# Patient Record
Sex: Female | Born: 1960 | Race: White | Hispanic: No | Marital: Married | Smoking: Never smoker
Health system: Southern US, Community
[De-identification: ages and names within clinical notes are randomized; demographics above are authoritative.]

## PROBLEM LIST (undated history)

## (undated) DIAGNOSIS — I729 Aneurysm of unspecified site: Secondary | ICD-10-CM

## (undated) DIAGNOSIS — J189 Pneumonia, unspecified organism: Secondary | ICD-10-CM

## (undated) DIAGNOSIS — E785 Hyperlipidemia, unspecified: Secondary | ICD-10-CM

## (undated) DIAGNOSIS — K8020 Calculus of gallbladder without cholecystitis without obstruction: Secondary | ICD-10-CM

## (undated) DIAGNOSIS — K760 Fatty (change of) liver, not elsewhere classified: Secondary | ICD-10-CM

## (undated) DIAGNOSIS — K219 Gastro-esophageal reflux disease without esophagitis: Secondary | ICD-10-CM

## (undated) DIAGNOSIS — I10 Essential (primary) hypertension: Secondary | ICD-10-CM

## (undated) DIAGNOSIS — C569 Malignant neoplasm of unspecified ovary: Secondary | ICD-10-CM

## (undated) DIAGNOSIS — T7840XA Allergy, unspecified, initial encounter: Secondary | ICD-10-CM

## (undated) DIAGNOSIS — Z8673 Personal history of transient ischemic attack (TIA), and cerebral infarction without residual deficits: Secondary | ICD-10-CM

## (undated) DIAGNOSIS — O1490 Unspecified pre-eclampsia, unspecified trimester: Secondary | ICD-10-CM

## (undated) DIAGNOSIS — Z6835 Body mass index (BMI) 35.0-35.9, adult: Secondary | ICD-10-CM

## (undated) HISTORY — DX: Allergy, unspecified, initial encounter: T78.40XA

## (undated) HISTORY — DX: Aneurysm of unspecified site: I72.9

## (undated) HISTORY — DX: Fatty (change of) liver, not elsewhere classified: K76.0

## (undated) HISTORY — DX: Gastro-esophageal reflux disease without esophagitis: K21.9

## (undated) HISTORY — DX: Hyperlipidemia, unspecified: E78.5

## (undated) HISTORY — DX: Body mass index (BMI) 35.0-35.9, adult: Z68.35

## (undated) HISTORY — DX: Unspecified pre-eclampsia, unspecified trimester: O14.90

## (undated) HISTORY — DX: Malignant neoplasm of unspecified ovary: C56.9

## (undated) HISTORY — PX: HYSTEROTOMY: SHX1776

## (undated) HISTORY — PX: BREAST BIOPSY: SHX20

## (undated) HISTORY — DX: Calculus of gallbladder without cholecystitis without obstruction: K80.20

## (undated) HISTORY — DX: Essential (primary) hypertension: I10

## (undated) HISTORY — DX: Personal history of transient ischemic attack (TIA), and cerebral infarction without residual deficits: Z86.73

---

## 1999-08-09 ENCOUNTER — Other Ambulatory Visit: Admission: RE | Admit: 1999-08-09 | Discharge: 1999-08-09 | Payer: Self-pay | Admitting: *Deleted

## 2001-12-17 ENCOUNTER — Other Ambulatory Visit: Admission: RE | Admit: 2001-12-17 | Discharge: 2001-12-17 | Payer: Self-pay | Admitting: Obstetrics and Gynecology

## 2003-09-16 ENCOUNTER — Encounter: Admission: RE | Admit: 2003-09-16 | Discharge: 2003-09-16 | Payer: Self-pay | Admitting: Obstetrics and Gynecology

## 2003-09-27 ENCOUNTER — Encounter: Admission: RE | Admit: 2003-09-27 | Discharge: 2003-09-27 | Payer: Self-pay | Admitting: Obstetrics and Gynecology

## 2003-10-08 DIAGNOSIS — Z8673 Personal history of transient ischemic attack (TIA), and cerebral infarction without residual deficits: Secondary | ICD-10-CM

## 2003-10-08 HISTORY — DX: Personal history of transient ischemic attack (TIA), and cerebral infarction without residual deficits: Z86.73

## 2003-11-09 ENCOUNTER — Other Ambulatory Visit: Admission: RE | Admit: 2003-11-09 | Discharge: 2003-11-09 | Payer: Self-pay | Admitting: Gynecology

## 2004-02-14 ENCOUNTER — Encounter: Admission: RE | Admit: 2004-02-14 | Discharge: 2004-05-14 | Payer: Self-pay | Admitting: Gynecology

## 2004-05-23 ENCOUNTER — Inpatient Hospital Stay (HOSPITAL_COMMUNITY): Admission: AD | Admit: 2004-05-23 | Discharge: 2004-05-25 | Payer: Self-pay | Admitting: Gynecology

## 2004-05-31 ENCOUNTER — Inpatient Hospital Stay (HOSPITAL_COMMUNITY): Admission: AD | Admit: 2004-05-31 | Discharge: 2004-06-03 | Payer: Self-pay | Admitting: Gynecology

## 2004-06-04 ENCOUNTER — Inpatient Hospital Stay (HOSPITAL_COMMUNITY): Admission: AD | Admit: 2004-06-04 | Discharge: 2004-06-09 | Payer: Self-pay | Admitting: Gynecology

## 2004-06-08 ENCOUNTER — Encounter: Payer: Self-pay | Admitting: Internal Medicine

## 2004-07-04 ENCOUNTER — Other Ambulatory Visit: Admission: RE | Admit: 2004-07-04 | Discharge: 2004-07-04 | Payer: Self-pay | Admitting: Gynecology

## 2004-09-17 ENCOUNTER — Encounter: Admission: RE | Admit: 2004-09-17 | Discharge: 2004-09-17 | Payer: Self-pay | Admitting: Gynecology

## 2005-01-02 ENCOUNTER — Encounter: Admission: RE | Admit: 2005-01-02 | Discharge: 2005-01-02 | Payer: Self-pay | Admitting: Neurology

## 2005-09-25 ENCOUNTER — Encounter: Admission: RE | Admit: 2005-09-25 | Discharge: 2005-09-25 | Payer: Self-pay | Admitting: Gynecology

## 2005-11-27 ENCOUNTER — Ambulatory Visit (HOSPITAL_COMMUNITY): Admission: RE | Admit: 2005-11-27 | Discharge: 2005-11-27 | Payer: Self-pay | Admitting: Family Medicine

## 2005-12-18 ENCOUNTER — Ambulatory Visit (HOSPITAL_COMMUNITY): Admission: RE | Admit: 2005-12-18 | Discharge: 2005-12-18 | Payer: Self-pay | Admitting: Family Medicine

## 2006-09-26 ENCOUNTER — Encounter: Admission: RE | Admit: 2006-09-26 | Discharge: 2006-09-26 | Payer: Self-pay | Admitting: Gynecology

## 2007-09-28 ENCOUNTER — Encounter: Admission: RE | Admit: 2007-09-28 | Discharge: 2007-09-28 | Payer: Self-pay | Admitting: Family Medicine

## 2008-10-18 ENCOUNTER — Encounter: Admission: RE | Admit: 2008-10-18 | Discharge: 2008-10-18 | Payer: Self-pay | Admitting: Family Medicine

## 2008-11-01 ENCOUNTER — Encounter: Payer: Self-pay | Admitting: Pulmonary Disease

## 2008-11-01 ENCOUNTER — Ambulatory Visit (HOSPITAL_COMMUNITY): Admission: RE | Admit: 2008-11-01 | Discharge: 2008-11-01 | Payer: Self-pay | Admitting: Family Medicine

## 2009-02-07 ENCOUNTER — Ambulatory Visit: Payer: Self-pay | Admitting: Pulmonary Disease

## 2009-02-07 DIAGNOSIS — R05 Cough: Secondary | ICD-10-CM

## 2009-02-07 DIAGNOSIS — R059 Cough, unspecified: Secondary | ICD-10-CM | POA: Insufficient documentation

## 2009-05-02 ENCOUNTER — Ambulatory Visit: Payer: Self-pay | Admitting: Gynecology

## 2009-05-02 ENCOUNTER — Encounter: Payer: Self-pay | Admitting: Gynecology

## 2009-05-02 ENCOUNTER — Other Ambulatory Visit: Admission: RE | Admit: 2009-05-02 | Discharge: 2009-05-02 | Payer: Self-pay | Admitting: Gynecology

## 2009-10-07 HISTORY — PX: ABDOMINAL HYSTERECTOMY: SHX81

## 2009-10-25 ENCOUNTER — Encounter: Admission: RE | Admit: 2009-10-25 | Discharge: 2009-10-25 | Payer: Self-pay | Admitting: Gynecology

## 2009-10-27 ENCOUNTER — Encounter: Admission: RE | Admit: 2009-10-27 | Discharge: 2009-10-27 | Payer: Self-pay | Admitting: Gynecology

## 2010-03-14 ENCOUNTER — Ambulatory Visit: Payer: Self-pay | Admitting: Gynecology

## 2010-03-22 ENCOUNTER — Encounter: Admission: RE | Admit: 2010-03-22 | Discharge: 2010-03-22 | Payer: Self-pay | Admitting: Gynecology

## 2010-05-03 ENCOUNTER — Other Ambulatory Visit: Admission: RE | Admit: 2010-05-03 | Discharge: 2010-05-03 | Payer: Self-pay | Admitting: Gynecology

## 2010-05-03 ENCOUNTER — Ambulatory Visit: Payer: Self-pay | Admitting: Gynecology

## 2010-05-29 ENCOUNTER — Ambulatory Visit: Payer: Self-pay | Admitting: Gynecology

## 2010-06-13 ENCOUNTER — Ambulatory Visit: Payer: Self-pay | Admitting: Gynecology

## 2010-06-19 ENCOUNTER — Ambulatory Visit: Payer: Self-pay | Admitting: Gynecology

## 2010-07-02 ENCOUNTER — Ambulatory Visit: Payer: Self-pay | Admitting: Gynecology

## 2010-07-06 ENCOUNTER — Ambulatory Visit: Payer: Self-pay | Admitting: Gynecology

## 2010-07-18 ENCOUNTER — Ambulatory Visit (HOSPITAL_COMMUNITY): Admission: RE | Admit: 2010-07-18 | Discharge: 2010-07-19 | Payer: Self-pay | Admitting: Gynecology

## 2010-07-18 ENCOUNTER — Encounter: Payer: Self-pay | Admitting: Gynecology

## 2010-07-18 ENCOUNTER — Ambulatory Visit: Payer: Self-pay | Admitting: Gynecology

## 2010-07-18 HISTORY — PX: LAPAROSCOPIC TOTAL HYSTERECTOMY: SUR800

## 2010-08-02 ENCOUNTER — Ambulatory Visit: Payer: Self-pay | Admitting: Gynecology

## 2010-08-27 ENCOUNTER — Ambulatory Visit: Payer: Self-pay | Admitting: Gynecology

## 2010-10-28 ENCOUNTER — Encounter: Payer: Self-pay | Admitting: Gynecology

## 2010-10-30 ENCOUNTER — Encounter
Admission: RE | Admit: 2010-10-30 | Discharge: 2010-10-30 | Payer: Self-pay | Source: Home / Self Care | Attending: Gynecology | Admitting: Gynecology

## 2010-12-19 LAB — CBC
HCT: 28.7 % — ABNORMAL LOW (ref 36.0–46.0)
HCT: 38 % (ref 36.0–46.0)
Hemoglobin: 12.7 g/dL (ref 12.0–15.0)
MCH: 29 pg (ref 26.0–34.0)
MCH: 29.2 pg (ref 26.0–34.0)
MCHC: 33.4 g/dL (ref 30.0–36.0)
MCV: 86.2 fL (ref 78.0–100.0)
MCV: 87.4 fL (ref 78.0–100.0)
Platelets: 326 K/uL (ref 150–400)
RBC: 3.33 MIL/uL — ABNORMAL LOW (ref 3.87–5.11)
RBC: 4.35 MIL/uL (ref 3.87–5.11)
RDW: 13.8 % (ref 11.5–15.5)
WBC: 5.9 K/uL (ref 4.0–10.5)
WBC: 8.3 10*3/uL (ref 4.0–10.5)

## 2010-12-19 LAB — URINALYSIS, ROUTINE W REFLEX MICROSCOPIC
Bilirubin Urine: NEGATIVE
Ketones, ur: NEGATIVE mg/dL
Nitrite: NEGATIVE
Specific Gravity, Urine: <1.005 — ABNORMAL LOW (ref 1.005–1.030)
Urobilinogen, UA: 0.2 mg/dL (ref 0.0–1.0)

## 2010-12-19 LAB — SURGICAL PCR SCREEN
MRSA, PCR: NEGATIVE
Staphylococcus aureus: NEGATIVE

## 2010-12-19 LAB — ABO/RH: ABO/RH(D): O POS

## 2010-12-19 LAB — HCG, SERUM, QUALITATIVE: Preg, Serum: NEGATIVE

## 2011-01-30 ENCOUNTER — Ambulatory Visit (HOSPITAL_COMMUNITY): Admission: RE | Admit: 2011-01-30 | Payer: Self-pay | Source: Ambulatory Visit | Admitting: Interventional Radiology

## 2011-02-22 NOTE — Discharge Summary (Signed)
NAMEJENISSA, TYRELL                         ACCOUNT NO.:  [id number]   MEDICAL RECORD NO.:  [id number]                   PATIENT TYPE:  INP   LOCATION:                                       FACILITY:  [facility]   PHYSICIAN:  Rene Paci, M.D. [facility]          DATE OF BIRTH:  [birth year]   DATE OF ADMISSION:  06/04/2004  DATE OF DISCHARGE:  06/09/2004                                 DISCHARGE SUMMARY   DISCHARGE DIAGNOSES:  1.  Headache.  2.  Eclampsia.  3.  Hypertension.  4.  Left posterior parietal subarachnoid hemorrhage.  5.  Hypokalemia.   BRIEF ADMISSION HISTORY:  Ms. Veno is a 50 year old white female who  delivered a baby on May 18, 2004.  At that time she was normotensive.  She was readmitted on June 01, 2004 complaining of headache and found to  have elevated blood pressures.  She was also noted to have elevated LFTs.  She was treated with magnesium sulfate in the AICU.  She was subsequently  discharged home from the hospital August 28 with a blood pressure of 150/85.  She contacted Dr. Lily Peer' office again on June 04, 2004 again with  worse headache.  In the office she was noted to have a blood pressure of  190/96.  Patient was readmitted to [facility] for further evaluation  and treatment.  While there the patient was initiated on labetalol with  improved blood pressure control.  CT of the head was obtained and revealed a  left parietal occipital subarachnoid hemorrhage.  Patient was transferred to  [facility] to the service of [facility] for further  treatment.   PAST MEDICAL HISTORY:  1.  G2, P2 status post vaginal delivery May 18, 2004.  2.  Gestational diabetes.   HOSPITAL COURSE:  #1 - NEUROLOGIC:  The patient presented with evidence of a  left posterior parietal subarachnoid hemorrhage.  She was seen in  consultation by neurosurgery; however, there was no indication for surgical  intervention.  She was also seen in  consultation by neurology.  Dr. Pearlean Brownie  recommended an MRI/MRA brain.  This was performed on June 07, 2004 and  confirmed a small left posterior parietal subarachnoid hemorrhage.  MRA of  the brain showed no aneurysm or vasculitis changes.  MRV revealed no  thrombosis.  Dr. Pearlean Brownie did discuss MRI/MRA with Dr. Corliss Skains.  They decided  to proceed with a cerebral angiogram to rule out possible underlying AVM.  Dr. Pearlean Brownie also recommended checking a sedimentation rate and ANA panel.  Cerebral angiogram is planned for this afternoon and if this is negative we  anticipate her discharge June 09, 2004.  Currently, our working  diagnosis is that she had a left posterior parietal subarachnoid hemorrhage  in the setting of eclampsia with a possible small capillary leak.   #2 - HYPERTENSION:  Patient's blood pressures are currently controlled with  systolic  blood pressures of 130 and currently she is on labetalol, HCTZ, and  Norvasc.  The patient will need close outpatient follow-up with her primary  care physician as well as her OB/GYN.   #3 - HYPOKALEMIA:  Patient developed some mild hypokalemia on HCTZ and we  will additionally send her home on potassium.   DISCHARGE LABORATORIES:  BMET was normal.  Coags were normal.  Urinalysis  was negative.  ANA, rheumatoid factor are both pending.  Sedimentation rate  is pending.   DISCHARGE MEDICATIONS:  1.  Labetalol 200 mg b.i.d.  2.  HCTZ 25 mg daily.  3.  Norvasc 2.5 mg daily.  4.  Vicodin 5/500 one tablet q.6h. p.r.n.  5.  Potassium chloride 10 mEq daily as long as she is taking HCTZ.   FOLLOWUP:  Follow up with Dr. Gerda Diss Tuesday, September 6 for a blood  pressure check and Dr. Lily Peer as instructed.      Cornell Barman, P.A. [facility]                  Rene Paci, M.D. [facility]    LC/MEDQ  D:  06/08/2004  T:  06/09/2004  Job:  [id number]   cc:   Gaetano Hawthorne. Lily Peer, M.D.  [address]  [place]  [place] [address]  Fax:  [phone]   Pramod P. Pearlean Brownie, MD  Fax: [phone]   Hewitt Shorts, M.D.  [address]  [place]  [place] [address]  Fax: [phone]   Scott A. Gerda Diss, M.D.  [address], [address]  [place]  [place] [address]  Fax: [phone]

## 2011-02-22 NOTE — Consult Note (Signed)
[name], [name]                         ACCOUNT NO.:  [id number]   MEDICAL RECORD NO.:  [id number]                   PATIENT TYPE:  INP   LOCATION:  3110                                 FACILITY:  [facility]   PHYSICIAN:  Pramod P. Pearlean Brownie, MD                 DATE OF BIRTH:  November 24, 1960   DATE OF CONSULTATION:  DATE OF DISCHARGE:                                   CONSULTATION   REFERRING PHYSICIAN:  Dr. Shirlean Kelly   REASON FOR REFERRAL:  Headache.   HISTORY OF PRESENT ILLNESS:  Ms. Cannella is a pleasant 50 year old lady who  recently delivered healthy baby girl two weeks ago.  Ten days ago she  started developing intermittent headaches.  She describes as being present  on the vertex, moderate to severe intensity, throbbing in nature, not  accompanied by nausea, vomiting, photo or phonophobia.  She was taking over-  the-counter analgesics which were effective of only temporary relief.  She  was seen to have elevated blood pressure and was treated for delayed  eclampsia.  However, yesterday she complained of severe headache and had a  CT scan of the head which showed a small subarachnoid hemorrhage in the left  parietal perifalcine region prompting neurosurgical consult from Dr.  Newell Coral who recommended neurological consult as there was low likelihood of  this being AV malformation or aneurysm or bleed.  Patient has no prior  history of migraine headache, seizures, or any significant neurological  problems.   PAST MEDICAL HISTORY:  Benign.   PAST SURGICAL HISTORY:  Wisdom tooth removal three years ago.   SOCIAL HISTORY:  She works in an office.  She has two young daughters 2  years and 24 weeks old.  She does not smoke or drink.  She lives in  [place] with her husband.   REVIEW OF SYSTEMS:  Not significant for recent fever, loss of weight, cough,  chest pain, diarrhea.  Significant for headache and recent delivery.   PHYSICAL EXAMINATION:  GENERAL:  Pleasant young lady  not in distress.  VITAL SIGNS:  Afebrile.  Pulse rate 70 per minute, regular, respiratory rate  16 per minute, distal pulses well felt, blood pressure at present 136/90.  HEENT:  Nontraumatic.  ENT examination unremarkable.  NECK:  Supple without bruit.  CARDIAC:  No murmur or gallop.  LUNGS:  Clear to auscultation.  NEUROLOGIC:  Patient is awake, alert, oriented x3 with normal speech and  language function.  There is no aphasia, dyspraxia, or dysarthria.  Pupils  equally reactive to light and accommodation.  Visual acuity and fields are  adequate.  Face is symmetric.  Palate elevates normally.  Tongue is midline.  Motor system examination reveals symmetric strength, tone, reflexes,  coordination, sensation.  Plantars are both downgoing.   DATA REVIEWED:  Noncontrast CAT scan of head done yesterday shows a tiny  area of subarachnoid hemorrhage noted in the  left high parietal perifalcine  region.  There is no parenchymal hemorrhage, brain edema, mass effect, or  hydrocephalus.   IMPRESSION:  A 50 year old lady with delayed postpartum eclampsia with a  headache likely secondary to hypertensive emergency with small amount of  capillary leak.  I doubt this is subarachnoid hemorrhage from aneurysm or AV  malformation.  Venous sinus thrombosis would also be less likely.   PLAN:  I would recommend aggressive blood pressure control with oral and  parenteral antihypertensives to keep blood pressure normal.  The headache is  already improved with blood pressure control and she may continue to use  symptomatic Tylenol if needed.  Recommend MRI scan of the brain with and  without contrast as well as MRA of the brain and MRV of the brain.  I do not  believe she needs diagnostic catheter angiogram at the present time.  She  may be followed by me electively as an outpatient for further neurovascular  work-up if necessary.  Thank you for the referral.                                                Pramod P. Pearlean Brownie, MD    PPS/MEDQ  D:  06/07/2004  T:  06/08/2004  Job:  [id number]

## 2011-02-22 NOTE — H&P (Signed)
NAMEJERRILYN, [name]                         ACCOUNT NO.:  [id number]   MEDICAL RECORD NO.:  [id number]                   PATIENT TYPE:  INP   LOCATION:  9169                                 FACILITY:  [facility]   PHYSICIAN:  Juan H. Lily Peer, M.D.             DATE OF BIRTH:  1961/04/04   DATE OF ADMISSION:  05/23/2004  DATE OF DISCHARGE:                                HISTORY & PHYSICAL   CHIEF COMPLAINT:  Contractions.   The patient is a 50 year old gravida 2 para 1 with a due date of May 21, 2004.  The patient is currently 40 and two-sevenths weeks gestation.  Presented to the office today complaining of contractions.  The patient with  known history of gestational diabetes, diet controlled.  Had been placed on  the monitor for a nonstress test which was reassuring.  She was found to be  contracting every 4-6 minutes apart.  Her cervix was found to be 3 cm  dilated, 80% effaced, -3 station, and intact.  An ultrasound had been done  for estimated fetal weight and AFI.  The ultrasound demonstrated the fetus  to be in the vertex presentation.  Fetal heart rate 162 beats per minute.  Grade 2 anterior fundal placenta.  Estimated fetal weight of 3930 g (8  pounds 10 ounces).  The AFI was 18.1 cm which is in the 80th percentile for  40-and-a-half weeks gestation.  The patient will be sent to [facility]  for anticipation of vaginal delivery.  The patient's prenatal course,  despite the gestational diabetes, she had been treated for urinary tract  infection and she had declined prenatal testing to include genetic  amniocentesis, maternal serum alpha-fetoprotein, and cystic fibrosis  screening.   REVIEW OF SYSTEMS:  See [place] form.   PAST MEDICAL HISTORY:  Spontaneous vaginal delivery in 1996 without any  complications.  She had a wisdom tooth removed approximately 3 years ago.  Gestational diabetes this pregnancy.   PHYSICAL EXAMINATION:  VITAL SIGNS:  The patient's blood  pressure is 110/64.  Urine was negative for protein and glucose.  Weight 184 pounds.  HEENT:  Unremarkable.  NECK:  Supple, trachea midline.  No carotid bruits, no thyromegaly.  LUNGS:  Clear to auscultation without rhonchi or wheezes.  HEART:  Regular rate and rhythm, no murmurs or gallops.  BREAST:  Exam not done.  ABDOMEN:  Gravid uterus, vertex presentation by Thayer Ohm maneuver confirmed  by ultrasound.  Fundal height 38-39 cm.  PELVIC:  Cervix 3 cm dilated, 80% effaced, -3 station, intact membranes.  EXTREMITIES:  DTRs 1+, negative for clonus, trace edema.   PRENATAL LABORATORY DATA:  The patient is O positive blood type, negative  antibody screen.  VDRL was nonreactive.  Rubella immune.  Hepatitis B  surface antigen and HIV were negative.  Pap smear was normal.  Declined  genetic amniocentesis.  Declined maternal serum alpha-fetoprotein.  Declined  cystic  fibrosis screening.  Abnormal diabetes screen.  GBS culture was  negative.   ASSESSMENT:  A 50 year old gravida 2 para 1 at 18 and two-sevenths weeks  gestation in early labor and uncomfortable with advanced cervical  dilatation.  Will admit to [facility] to start on Pitocin augmentation  with rupture of membranes, with anticipation of a vaginal delivery.  Her GBS  culture was negative.  The patient requesting epidural for pain relief as  well.  The patient is a gestational diabetic, diet controlled.   PLAN:  Anticipate vaginal delivery.                                               Juan H. Lily Peer, M.D.    JHF/MEDQ  D:  05/23/2004  T:  05/23/2004  Job:  [id number]

## 2011-02-22 NOTE — H&P (Signed)
[name], [name]                         ACCOUNT NO.:  [id number]   MEDICAL RECORD NO.:  [id number]                   PATIENT TYPE:  INP   LOCATION:  9374                                 [facility]ACILITY[facility]   PHYSICIAN:  Juan H. Lily Peer, M.D.             DATE OF BIRTH:  [birth year]   DATE OF ADMISSION:  06/04/2004  DATE OF DISCHARGE:                                HISTORY & PHYSICAL   CHIEF COMPLAINT:  Severe headaches.   HISTORY:  The patient is a 50 year old, gravida 2, para 2 who had an  uncomplicated normal spontaneous vaginal delivery on August 12 and was  discharged home on August 19, routine two day postpartum visit.  Her  hemoglobin and hematocrit were 11.6, 33.3 and platelet count of 206,000 and  she was normotensive. She was readmitted on August 26 complaining of severe  headaches and was found to elevated blood pressures and was found to have  elevated liver function tests with an SGOT of 108 and an LDH as high as  207,000. She was kept on magnesium sulfate in the AICU and had been given  Demerol and Phenergan for headaches and subsequently she was discharged home  from the hospital on August 28 with blood pressures in the 150/85, 142/57,  131/71 and she was asymptomatic and was sent home with Tylox p.r.n. pain.  She contacted the office this afternoon complaining of headaches worse last  night and this morning.  When she presented to the office at [facility], her blood pressure was found to be 190/96.  The patient  complained of a headache on a 1-10 scale of 9/10, no right upper quadrant  pain, no visual disturbances, no nausea or vomiting. The patient denies any  history of migraine headaches and review of her prenatal record indicated  she had been normotensive throughout her pregnancy she just had gestational  diabetes. She does have advanced maternal age whereby she is currently 50  years of age.  She was brought over to the Adult Intensive Care Unit and  her  blood pressure systolics were running in the 180's and diastolic in the low  90's and she was given 20 mg of labetalol IV push and then had a second dose  repeated approximately 20 minutes later and was started on magnesium sulfate  4 g bolus followed by 2 g __________ to help with her headaches.  She was  given Demerol 25 mg with 12.5 of Phenergan and an additional 40 mg of  labetalol 20 minutes later was administered at approximately 1830 hours to  bring her blood pressure under control.  Her last blood pressure reading was  157/78 and her O2 saturations were 92% on room air so she was going to be  given nasal cannula with 3 liters per minute. We will hold off on the  labetalol drip at this point and it looks like she may be responding  now  with the above mentioned regimen and will continue to monitor closely and  keep her on the labetalol starting with 100 mg p.o. q. 12 hours starting at  0630 hours tomorrow morning with repeat of her PIH labs.  After a Foley  catheter was placed, she put out approximately 300 mL within an hour but  prior to the Foley catheter being inserted she had also put out another 300  so her total is 600 mL in an approximately two hour interval. The patient  did not have an edema, deep tendon reflexes were 0-1 and no clonus. Review  of her record also indicated when she was released from the intensive care  unit this past weekend, her weight was 172 pounds, current weight is 164.5  pounds.  The patient's headache resolved with the above regimen.   PAST MEDICAL HISTORY:  Patient with two normal spontaneous vaginal  deliveries, gestational diabetes last pregnancy. Oral surgery several years  ago for wisdom tooth otherwise the patient has been healthy until previous  admission with postpartum preeclampsia.   PHYSICAL EXAMINATION:  VITAL SIGNS:  Blood pressure readings as described  above.  HEENT:  Unremarkable. The was no papilledema, no retinal exudate.   LUNGS:  Clear to auscultation, no rhonchi or wheezes.  HEART:  Regular rate and rhythm. No murmurs or gallops.  BREASTS:  Not done.  ABDOMEN:  Soft, nontender without rebound or guarding.  PELVIC:  Not done.  EXTREMITIES:  Trace edema, DTR 0 to 1+, negative clonus.   ADMISSION LABS:  The patient's white blood count 11.6, hemoglobin 13.9,  hematocrit 41.2, platelet count 251,000, uric acid at 4.9.  LDH was elevated  at 302 unit/liter and her complete metabolic panel all parameters were  normal except the SGPT was slightly elevated at 67.   ASSESSMENT:  A 50 year old, gravida 2, para 2 readmitted to [facility]  with headaches and elevated blood pressure with clinical evidence of  persistent preeclampsia possibly underlying chronic hypertension. The  patient required labetalol 20 mg IV push x2 and a third dose at 40 mg was  administered to get her blood pressure under control. She started a  magnesium sulfate 4 g bolus followed by 2 g/hour and received Demerol 25 mg  with 12.5 Phenergan for headaches. The patient currently sleeping and blood  pressure 157/78 and rates her headache on a 1-10 scale of 2/10 where on  admission it was 9/10.  She is voiding well, will hold off any diuretic at  the present time. Will hold off on the labetalol drip. If she continues to  be normotensive with the above regimen will proceed then with keeping her on  oral labetalol 100 mg q. 12h.  Will repeat the PIH panel in the morning.  Will start also nasal O2 at 3 liters per minute due to the fact that her O2  saturations were down to 92%.   PLAN:  As per assessment above.                                               Juan H. Lily Peer, M.D.    JHF/MEDQ  D:  06/04/2004  T:  06/04/2004  Job:  [id number]

## 2011-02-22 NOTE — Consult Note (Signed)
[name], POINT                         ACCOUNT NO.:  [id number]   MEDICAL RECORD NO.:  [id number]                   PATIENT TYPE:  INP   LOCATION:  3110                                 FACILITY:  [facility]   PHYSICIAN:  Wanda Plump, MD [facility]                 DATE OF BIRTH:  1961/06/11   DATE OF CONSULTATION:  06/06/2004  DATE OF DISCHARGE:                                   CONSULTATION   REASON FOR CONSULTATION:  [name] is a 50 year old white female, patient  of Dr. Reynaldo Minium who is status post normal vaginal delivery on May 18, 2004.  The postpartum was complicated by hypertension, headache and  increased LFTs.  She was admitted on June 01, 2004 due to these problems  and discharged home two days later when she was asymptomatic with a well-  controlled blood pressure.  The next day she complained of severe headache  and that prompted reevaluation by Dr. Lily Peer.  At this time she was found  to have very high blood pressure at the level of 190/96.  She was admitted  to the intensive care unit at [facility].  At that time she was  started on labetalol as well as magnesium sulfate.  The patient responded  well to the treatment.  However, due to the persistent headaches, a CT scan  of the head was done.  It did show a small subarachnoid hemorrhage and this  prompted this consultation and the transfer to [facility].  At the  time of exam, the patient reports she is feeling better.  The headache which  was intense and global before is now mild and mostly located at the nuchal  area.  In additional to this consultation, neurosurgery has also been asked  to evaluate the patient.   PAST MEDICAL HISTORY:  1.  The patient is G2, P2, status post last vaginal delivery May 18, 2004.  This pregnancy was complicated with gestational diabetes.  2.  Other than that she denies any history of hypertension, diabetes, heart      disease, or headaches in the  past.   FAMILY HISTORY:  Mother is 79 years old.  She was diagnosed with  hypertension when she was around 50 years old.  She recently had a stroke.  Father is doing well.  He has history of prostate cancer.  She believes  siblings are doing well.  To the patient's knowledge they do not have any  diabetes or hypertension.  She admits that her mother's side of the family  have some history of coronary artery disease.   SOCIAL HISTORY:  Does not smoke and drinks very seldom.   REVIEW OF SYSTEMS:  She denies any fever, chills, nausea, or vomiting.  No  chest pain, shortness of breath.  After her pregnancy, she had bilateral  ankle edema but that  is largely resolved.  She still has some vaginal  bleeding from the recent delivery.  Again she denies any previous headaches  and no history of tremors or anxiety.   MEDICATIONS:  Prior to this admission, she was only taking prenatal vitamins  and some pain medication for headache.  She specifically denies the use of  any over-the-counter or __________  medication.   ALLERGIES:  No known drug allergies.   PHYSICAL EXAMINATION:  GENERAL:  By the time of my examination, the patient  is alert, oriented and in no apparent distress.  She is coherent and  cooperative.  VITAL SIGNS:  Blood pressure at [facility] was found to be 172/74  with a heart rate of 62, O2 saturation 96%.  LUNGS:  Clear to auscultation bilaterally.  CARDIOVASCULAR:  Regular rate and rhythm without murmur.  ABDOMEN:  Not distended.  Soft.  Good bowel sounds.  No bruits or masses.  EXTREMITIES:  No edema.  NEUROLOGIC:  Motor and face are symmetric.  External ocular movements are  intact.  Pupils are equal and reactive to light.  I perform bedside  funduscopy which was very limited but I did not see any __________  or  hemorrhage.  Her speech is clear.   LABORATORY AND X-RAY DATA:  Uric acid is 5.3, LDH 232, magnesium 2.6, sodium  140, potassium 3.5, BUN 8,  creatinine 0.8, blood sugar 94.  Liver function  tests were negative except for a protein of 5.4.  Hemoglobin is 10.2,  unstable.  Platelets have been stable at around 256 and white count is 8.8.  EKG shows sinus bradycardia and upon admission, urinalysis was negative.  CT  scan of the head shows again very small subarachnoid hemorrhage.   ASSESSMENT/PLAN:  1.  The patient has been transferred to my service with hypertensive      emergency.  At this point the blood pressure is under better control      with labetalol and hydrochlorothiazide.  The goal blood pressure at this      time per neurosurgery is a systolic of around 160.  I am planning to add      a low dose of Norvasc and use hydralazine p.r.n. if needed.  We also      must rule out secondary causes of hypertension.  Until then, we will go      ahead and check ultrasound, TSH, catecholamines.  We will also need to      rule out all etiologies for the hemorrhage such as vasculitis.  Will get      a sed rate and ANA.  2.  Subarachnoid hemorrhage.  She is being evaluated by neurosurgery.  We      will consult the stroke team in the morning.  Neurosurgery is planning      to redo a CT scan in one or two days and they are considering an      arteriogram of the brain.                                               Wanda Plump, MD [facility]    JEP/MEDQ  D:  06/06/2004  T:  06/07/2004  Job:  [id number]

## 2011-02-22 NOTE — Discharge Summary (Signed)
NAMEANINA, SCHNAKE                         ACCOUNT NO.:  [id number]   MEDICAL RECORD NO.:  [id number]                   PATIENT TYPE:  INP   LOCATION:  9317                                 FACILITY:  [facility]   PHYSICIAN:  Timothy P. Fontaine, M.D.           DATE OF BIRTH:  [birth year]   DATE OF ADMISSION:  05/31/2004  DATE OF DISCHARGE:  06/03/2004                                 DISCHARGE SUMMARY   DISCHARGE DIAGNOSES:  Postpartum preeclampsia.   HISTORY OF PRESENT ILLNESS:  [age]year-old status post vaginal delivery  May 23, 2004, who presents complaining of headache.  Her initial  evaluation showed elevated blood pressures in the 169/86, 171/88, 151/88  range.  Initial laboratory evaluation showed elevated SGOT, SGPT at 67 and  148.  Her platelet count was normal.  Her uric acid was 6.5.   HOSPITAL COURSE:  She was admitted and begun on magnesium sulfate and over  the ensuing two days improved to the 140-150/70-80 range.  Her headaches  resolved without signs and symptoms of preeclampsia, and her repeat blood  studies showed improvement with her SGOT of 32, SGPT of 94, and a mildly  elevated LDH of 270.  Her uric acid was 5.3.  The patient was discharged  home noting clear lungs, normal cardiac evaluation without peripheral  swelling with symptoms reviewed to call ASAP with any signs or symptoms of  preeclampsia which were reviewed with her and her husband prior to  discharge.  She was given a prescription for Tylox #20, for pain and she was  to return to her postpartum appointment which had already been scheduled.                                               Timothy P. Audie Box, M.D.    TPF/MEDQ  D:  06/18/2004  T:  06/18/2004  Job:  [id number]

## 2011-02-22 NOTE — Consult Note (Signed)
[name], [name]                         ACCOUNT NO.:  [id number]   MEDICAL RECORD NO.:  [id number]                   PATIENT TYPE:  INP   LOCATION:  3110                                 FACILITY:  [facility]   PHYSICIAN:  Hewitt Shorts, M.D.            DATE OF BIRTH:  02/06/1961   DATE OF CONSULTATION:  06/06/2004  DATE OF DISCHARGE:                                   CONSULTATION   HISTORY OF PRESENT ILLNESS:  The patient is a 50 year old white female who  is 14 days postpartum following an uneventful vaginal delivery of her second  child.  She is gravida 2, para 2.  Her pregnancy itself is nearly  unremarkable.  She noted that she had some hypoglycemia that she treated by  diet modification.  She notes that prior to pregnancy, she was otherwise  healthy.  The patient was discharged following her delivery two days after  delivery but five days afterwards, she developed significant headache.  She  recontacted her OB and was subsequently evaluated later that week in the  office and found to have significant elevation of her blood pressure.  She  was admitted to the [facility] and treated for postpartum pre-  eclampsia.  She was found to have mildly elevated liver function tests.  She  was treated with a number of different medications and after being  stabilized over several days, was discharged three days ago.  However, less  than 12 hours later, she once again developed significant headache, this  time associated with nausea and vomiting.  She returned to the [facility] later that  day, again was found to be hypertensive, and was admitted at that time (two  days ago).  The patient is once again treated for hypertension and  postpartum pre-eclampsia but because of continued complaints of headache,  although it had improved, CT of the brain without contrast was obtained this  evening.  She was found to have minimal left parietal medial sulcus  subarachnoid hemorrhage, the actual  volume of blood was quite minimal, there  was no associated mass affect, and the [facility] hospitalist, Dr. Willow Ora,  was contacted and he accepted the patient in transfer to the hospitalist  service.  He was transferred to the neurosurgical intensive care unit at  [facility].  Dr. Lily Peer consulted me, as well, and asked that I  see the patient once she arrived at [facility].   At this time, the patient does complain of some mild left medial parietal  occipital headache.  She denies any nausea, vomiting, diplopia, blurred  vision, weakness, seizures or other neurologic difficulties.   PAST MEDICAL HISTORY:  She has no history of hypertension, myocardial  infarction, cancer, previous stroke, diabetes (although she did have some  hyperglycemia during her pregnancy) peptic ulcer disease, lung disease.  No  previous surgeries.  She denies allergies to medications and she takes no  medications on a regular basis.   FAMILY HISTORY:  Her parents are in their 20s and both have hypertension and  heart disease.   SOCIAL HISTORY:  The patient does secretarial work, she is married, this is  her second child, her other child is 7 1/2 years old.  She does not smoke.  She drinks alcoholic beverages on a social basis.   REVIEW OF SYMPTOMS:  Notable for as described in the history of present  illness and past medical history, but is, otherwise, unremarkable.   PHYSICAL EXAMINATION:  GENERAL:  The patient is a well developed, well nourished white female in no  acute distress.  VITAL SIGNS:  Temperature 99.2, pulse 62, blood pressure 170/70, respiratory  rate 14.  NEUROLOGICAL EXAMINATION:  Mental status of the patient is she is awake,  alert, she is fully oriented, her speech is fluent, she has good  comprehension, she follows commands briskly.  Cranial nerves show pupils  equal, round, reactive to light and approximately 3 mm bilaterally,  extraocular movements intact, facial  sensation is symmetrical, facial  movement is symmetrical, hearing present bilaterally, palatal movement is  symmetrical, shoulder shrug is symmetrical, tongue in the midline.  Motor  examination shows 5/5 strength in the upper and lower extremities.  She has  no drift to the upper extremities.  Sensory examination is intact sensation  to pin prick.  Reflexes are symmetrical bilaterally.  Her neck is supple.  Gait and stance were not tested.   DIAGNOSTICS:  CT scan of the brain from this evening at [facility] was  reviewed.  The study shows no evidence of subarachnoid hemorrhage in the  basilar cisterns or Sylvian fissures.  There is a minimal amount of blood in  the medial sulci along the left parietal occipital region.  There is no mass  affect.  The appearance is most suggestive of a subarachnoid hemorrhage  associated with hypertensive crisis rather than from aneurysm or vascular  malformation.   IMPRESSION:  Postpartum hypertensive crisis associated with headache.  CT  this evening shows minimal sulcus subarachnoid hemorrhage most consistent  with hypertensive crisis, not consistent with aneurysmal subarachnoid  hemorrhage, unlikely to be due to vascular malformation subarachnoid  hemorrhage.  The patient is neurologically intact but continues to be  hypertensive.   RECOMMENDATIONS:  The case has been discussed with Dr. Lily Peer and also  Dr. Drue Novel.  I think it is essential for the blood pressure to be controlled  ideally to a systolic less than 160 mmHg and eventually progressing to an  oral anti-hypertensive regimen that can be continued at home.  Neurological  checks have been ordered and should be continued.  Follow up CT scan of the  brain without contrast should be obtained on the morning of September 2 or  sooner if she has any neurologic deterioration.  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  I will continue from a neurosurgical perspective but tend to doubt that  neurosurgical intervention will be necessary.  I have had an opportunity to  discuss the case thoroughly with Dr. Lily Peer and Dr. Drue Novel but also with the  patient and her husband  and I have had an opportunity to review the CT scan  with the patient's husband as well as with Dr. Drue Novel.                                               Hewitt Shorts, M.D.    RWN/MEDQ  D:  06/06/2004  T:  06/07/2004  Job:  [id number]   cc:   Gaetano Hawthorne. Lily Peer, M.D.  [address]  [place]  [place] [address]  Fax: [phone]   Wanda Plump, MD [facility]  [phone] [address] [place], [place] [address]

## 2011-02-22 NOTE — Procedures (Signed)
[name], [name]               ACCOUNT NO.:  [id number]   MEDICAL RECORD NO.:  [id number]          PATIENT TYPE:  OUT   LOCATION:  RESP                          FACILITY:  [facility]   PHYSICIAN:  Edward L. Juanetta Gosling, M.D.DATE OF BIRTH:  1960/10/29   DATE OF PROCEDURE:  DATE OF DISCHARGE:  11/01/2008                            PULMONARY FUNCTION TEST   PULMONARY FUNCTION TEST   Spirometry is normal.      Edward L. Juanetta Gosling, M.D.  Electronically Signed     ELH/MEDQ  D:  11/07/2008  T:  11/07/2008  Job:  [id number]   cc:   Lorin Picket A. Gerda Diss, MD  Fax: [phone]

## 2011-11-12 ENCOUNTER — Other Ambulatory Visit: Payer: Self-pay | Admitting: Gynecology

## 2011-11-12 DIAGNOSIS — Z1231 Encounter for screening mammogram for malignant neoplasm of breast: Secondary | ICD-10-CM

## 2011-12-09 ENCOUNTER — Ambulatory Visit
Admission: RE | Admit: 2011-12-09 | Discharge: 2011-12-09 | Disposition: A | Discharge status: Home / Self Care | Payer: BC Managed Care – PPO | Source: Ambulatory Visit | Attending: Gynecology | Admitting: Gynecology

## 2011-12-09 DIAGNOSIS — Z1231 Encounter for screening mammogram for malignant neoplasm of breast: Secondary | ICD-10-CM

## 2012-02-12 ENCOUNTER — Ambulatory Visit (HOSPITAL_COMMUNITY)
Admission: RE | Admit: 2012-02-12 | Discharge: 2012-02-12 | Disposition: A | Discharge status: Home / Self Care | Payer: BC Managed Care – PPO | Source: Ambulatory Visit | Attending: Family Medicine | Admitting: Family Medicine

## 2012-02-12 ENCOUNTER — Other Ambulatory Visit: Payer: Self-pay | Admitting: Family Medicine

## 2012-02-12 DIAGNOSIS — R222 Localized swelling, mass and lump, trunk: Secondary | ICD-10-CM

## 2012-10-26 ENCOUNTER — Other Ambulatory Visit: Payer: Self-pay | Admitting: Family Medicine

## 2012-10-26 ENCOUNTER — Ambulatory Visit (HOSPITAL_COMMUNITY)
Admission: RE | Admit: 2012-10-26 | Discharge: 2012-10-26 | Disposition: A | Discharge status: Home / Self Care | Payer: BC Managed Care – PPO | Source: Ambulatory Visit | Attending: Family Medicine | Admitting: Family Medicine

## 2012-10-26 DIAGNOSIS — R52 Pain, unspecified: Secondary | ICD-10-CM

## 2012-10-26 DIAGNOSIS — R05 Cough: Secondary | ICD-10-CM | POA: Insufficient documentation

## 2012-10-26 DIAGNOSIS — M792 Neuralgia and neuritis, unspecified: Secondary | ICD-10-CM

## 2012-10-26 DIAGNOSIS — R079 Chest pain, unspecified: Secondary | ICD-10-CM | POA: Insufficient documentation

## 2012-10-26 DIAGNOSIS — R059 Cough, unspecified: Secondary | ICD-10-CM | POA: Insufficient documentation

## 2012-10-26 DIAGNOSIS — M542 Cervicalgia: Secondary | ICD-10-CM | POA: Insufficient documentation

## 2012-10-27 ENCOUNTER — Other Ambulatory Visit: Payer: Self-pay | Admitting: Family Medicine

## 2012-10-27 DIAGNOSIS — M542 Cervicalgia: Secondary | ICD-10-CM

## 2012-10-27 DIAGNOSIS — M25519 Pain in unspecified shoulder: Secondary | ICD-10-CM

## 2012-10-29 ENCOUNTER — Encounter (HOSPITAL_COMMUNITY): Payer: Self-pay

## 2012-10-29 ENCOUNTER — Ambulatory Visit (HOSPITAL_COMMUNITY)
Admission: RE | Admit: 2012-10-29 | Discharge: 2012-10-29 | Disposition: A | Discharge status: Home / Self Care | Payer: BC Managed Care – PPO | Source: Ambulatory Visit | Attending: Family Medicine | Admitting: Family Medicine

## 2012-10-29 DIAGNOSIS — M542 Cervicalgia: Secondary | ICD-10-CM | POA: Insufficient documentation

## 2012-10-29 DIAGNOSIS — M5020 Other cervical disc displacement, unspecified cervical region: Secondary | ICD-10-CM | POA: Insufficient documentation

## 2012-10-29 DIAGNOSIS — M25519 Pain in unspecified shoulder: Secondary | ICD-10-CM

## 2012-11-07 HISTORY — PX: NECK SURGERY: SHX720

## 2012-12-28 ENCOUNTER — Encounter: Payer: Self-pay | Admitting: *Deleted

## 2012-12-31 ENCOUNTER — Other Ambulatory Visit: Payer: Self-pay

## 2012-12-31 DIAGNOSIS — Z1231 Encounter for screening mammogram for malignant neoplasm of breast: Secondary | ICD-10-CM

## 2013-01-20 ENCOUNTER — Ambulatory Visit (INDEPENDENT_AMBULATORY_CARE_PROVIDER_SITE_OTHER): Payer: BC Managed Care – PPO | Admitting: Nurse Practitioner

## 2013-01-20 ENCOUNTER — Encounter: Payer: Self-pay | Admitting: Nurse Practitioner

## 2013-01-20 VITALS — BP 96/66 | Temp 98.0°F | Wt 168.2 lb

## 2013-01-20 DIAGNOSIS — A084 Viral intestinal infection, unspecified: Secondary | ICD-10-CM | POA: Insufficient documentation

## 2013-01-20 DIAGNOSIS — N6320 Unspecified lump in the left breast, unspecified quadrant: Secondary | ICD-10-CM

## 2013-01-20 DIAGNOSIS — N63 Unspecified lump in unspecified breast: Secondary | ICD-10-CM

## 2013-01-20 DIAGNOSIS — Z1239 Encounter for other screening for malignant neoplasm of breast: Secondary | ICD-10-CM

## 2013-01-20 DIAGNOSIS — A088 Other specified intestinal infections: Secondary | ICD-10-CM

## 2013-01-20 NOTE — Assessment & Plan Note (Signed)
Increase clear liquid intake. Patient has Zofran at home for nausea. Few dietary measures and warning signs. Call back in 48 hours if no improvement, sooner if worse.

## 2013-01-20 NOTE — Patient Instructions (Signed)
Viral Gastroenteritis Viral gastroenteritis is also known as stomach flu. This condition affects the stomach and intestinal tract. It can cause sudden diarrhea and vomiting. The illness typically lasts 3 to 8 days. Most people develop an immune response that eventually gets rid of the virus. While this natural response develops, the virus can make you quite ill. CAUSES  Many different viruses can cause gastroenteritis, such as rotavirus or noroviruses. You can catch one of these viruses by consuming contaminated food or water. You may also catch a virus by sharing utensils or other personal items with an infected person or by touching a contaminated surface. SYMPTOMS  The most common symptoms are diarrhea and vomiting. These problems can cause a severe loss of body fluids (dehydration) and a body salt (electrolyte) imbalance. Other symptoms may include:  Fever.  Headache.  Fatigue.  Abdominal pain. DIAGNOSIS  Your caregiver can usually diagnose viral gastroenteritis based on your symptoms and a physical exam. A stool sample may also be taken to test for the presence of viruses or other infections. TREATMENT  This illness typically goes away on its own. Treatments are aimed at rehydration. The most serious cases of viral gastroenteritis involve vomiting so severely that you are not able to keep fluids down. In these cases, fluids must be given through an intravenous line (IV). HOME CARE INSTRUCTIONS   Drink enough fluids to keep your urine clear or pale yellow. Drink small amounts of fluids frequently and increase the amounts as tolerated.  Ask your caregiver for specific rehydration instructions.  Avoid:  Foods high in sugar.  Alcohol.  Carbonated drinks.  Tobacco.  Juice.  Caffeine drinks.  Extremely hot or cold fluids.  Fatty, greasy foods.  Too much intake of anything at one time.  Dairy products until 24 to 48 hours after diarrhea stops.  You may consume probiotics.  Probiotics are active cultures of beneficial bacteria. They may lessen the amount and number of diarrheal stools in adults. Probiotics can be found in yogurt with active cultures and in supplements.  Wash your hands well to avoid spreading the virus.  Only take over-the-counter or prescription medicines for pain, discomfort, or fever as directed by your caregiver. Do not give aspirin to children. Antidiarrheal medicines are not recommended.  Ask your caregiver if you should continue to take your regular prescribed and over-the-counter medicines.  Keep all follow-up appointments as directed by your caregiver. SEEK IMMEDIATE MEDICAL CARE IF:   You are unable to keep fluids down.  You do not urinate at least once every 6 to 8 hours.  You develop shortness of breath.  You notice blood in your stool or vomit. This may look like coffee grounds.  You have abdominal pain that increases or is concentrated in one small area (localized).  You have persistent vomiting or diarrhea.  You have a fever.  The patient is a child younger than 3 months, and he or she has a fever.  The patient is a child older than 3 months, and he or she has a fever and persistent symptoms.  The patient is a child older than 3 months, and he or she has a fever and symptoms suddenly get worse.  The patient is a baby, and he or she has no tears when crying. MAKE SURE YOU:   Understand these instructions.  Will watch your condition.  Will get help right away if you are not doing well or get worse. Document Released: 09/23/2005 Document Revised: 12/16/2011 Document Reviewed: 07/10/2011   ExitCare Patient Information 2013 [employer].  

## 2013-01-20 NOTE — Progress Notes (Signed)
Subjective:  Presents for referral to [facility] for a recheck of chest wall mass on the upper outer part of the left breast. Has been there for several years. Seems to be getting larger and a new area has been noted near the clavicle. Is due for her screening mammogram as well. Also this morning began having diarrhea and vomiting, her daughter has had similar symptoms over the past few days. Taking some clear fluids. No urinary symptoms. No fever. Some chills.   Objective:   BP 96/66  Temp(Src) 98 F (36.7 C)  Wt 168 lb 3.2 oz (76.295 kg)  BMI 26.34 kg/m2 NAD. Alert, oriented. Lungs clear. Heart regular rate rhythm. Abdomen soft nondistended with mild epigastric area tenderness. A large oblong rubbery mobile cystic area noted in the upper outer part of the left breast at the top of the tail of Spence. There is a small area attached to this. A new area similar in texture near the clavicle. Last evaluated by mammogram and ultrasound June 2011. Minimally tender to palpation.

## 2013-01-20 NOTE — Assessment & Plan Note (Signed)
Patient [facility]. Orders will be and for this per her request. Further followup based on results. Feel these areas are probably some form of lipoma.

## 2013-02-10 ENCOUNTER — Ambulatory Visit
Admission: RE | Admit: 2013-02-10 | Discharge: 2013-02-10 | Disposition: A | Discharge status: Home / Self Care | Payer: BC Managed Care – PPO | Source: Ambulatory Visit | Attending: Nurse Practitioner | Admitting: Nurse Practitioner

## 2013-02-10 ENCOUNTER — Other Ambulatory Visit: Payer: Self-pay | Admitting: Nurse Practitioner

## 2013-02-10 ENCOUNTER — Ambulatory Visit: Payer: BC Managed Care – PPO

## 2013-02-10 ENCOUNTER — Telehealth: Payer: Self-pay | Admitting: Nurse Practitioner

## 2013-02-10 DIAGNOSIS — Z1239 Encounter for other screening for malignant neoplasm of breast: Secondary | ICD-10-CM

## 2013-02-10 DIAGNOSIS — N6320 Unspecified lump in the left breast, unspecified quadrant: Secondary | ICD-10-CM

## 2013-02-10 NOTE — Telephone Encounter (Signed)
PT had her mammogram today and an ultra sound of her left upper chest, but the issue in her shoulder could not be addressed. They told the pt to reach back out to her provider to get it looked at in another way?

## 2013-02-11 ENCOUNTER — Other Ambulatory Visit: Payer: Self-pay | Admitting: *Deleted

## 2013-02-11 DIAGNOSIS — L729 Follicular cyst of the skin and subcutaneous tissue, unspecified: Secondary | ICD-10-CM

## 2013-02-11 NOTE — Telephone Encounter (Signed)
Referral in progress. 

## 2013-02-11 NOTE — Telephone Encounter (Signed)
I would recommend referral to general surgeon of her choice for evaluation.  They can evaluate and decide what further imaging needs to be done and if cyst needs to be removed.

## 2013-02-12 ENCOUNTER — Telehealth: Payer: Self-pay | Admitting: Family Medicine

## 2013-02-12 NOTE — Telephone Encounter (Signed)
Patient called wanting specific doctor at [facility], I called her back and LMOM at home and work.  Gave her their phone number, she may call to change, referral is done in system, changing shouldn't be a problem.

## 2013-02-17 ENCOUNTER — Ambulatory Visit (INDEPENDENT_AMBULATORY_CARE_PROVIDER_SITE_OTHER): Payer: BC Managed Care – PPO | Admitting: Surgery

## 2013-03-03 ENCOUNTER — Ambulatory Visit (INDEPENDENT_AMBULATORY_CARE_PROVIDER_SITE_OTHER): Payer: BC Managed Care – PPO | Admitting: Surgery

## 2013-04-13 ENCOUNTER — Encounter (INDEPENDENT_AMBULATORY_CARE_PROVIDER_SITE_OTHER): Payer: Self-pay | Admitting: Surgery

## 2013-04-13 ENCOUNTER — Ambulatory Visit (INDEPENDENT_AMBULATORY_CARE_PROVIDER_SITE_OTHER): Payer: BC Managed Care – PPO | Admitting: Surgery

## 2013-04-13 VITALS — BP 118/72 | HR 80 | Temp 98.2°F | Resp 17 | Ht 68.0 in | Wt 170.0 lb

## 2013-04-13 DIAGNOSIS — N6320 Unspecified lump in the left breast, unspecified quadrant: Secondary | ICD-10-CM

## 2013-04-13 DIAGNOSIS — N63 Unspecified lump in unspecified breast: Secondary | ICD-10-CM

## 2013-04-13 DIAGNOSIS — R222 Localized swelling, mass and lump, trunk: Secondary | ICD-10-CM | POA: Insufficient documentation

## 2013-04-13 NOTE — Progress Notes (Signed)
Re:   [name] DOB:   05-03-1961 MRN:   [id number]  ASSESSMENT AND PLAN: 1.  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  I gave the patient literature on lipomas.  I discussed that these could either be watched or removed.  She is not interested in surgery at this time, but wants me to recheck these when she gets her mammogram next year.  She knows to call me if these masses change.  Otherwise I will see her about May 2015.  2.  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 3. Recent left neck surgery - for which she has done well.  Chief Complaint  Patient presents with  . New Evaluation    eval LT Chest wall cyst   REFERRING PHYSICIAN: LUKING,SCOTT, MD  HISTORY OF PRESENT ILLNESS: [name] is a 52 y.o. (DOB: [birth year])  white  female whose primary care physician is LUKING,SCOTT, MD and comes to me today for 2 masses - on left supraclavicular and one left upper breast.  She is accompanied with her husband.  Ms. Canevari noticed a mass in the superior aspect of her left breast for at least 9 years.  It has not changed on her exam during that time.  More recently, about 4 months, she noticed a mass over the left clavicle.  It has not changed much since she first noticed it.  Neither mass is painful or bothering her.  She had a mammogram and [place] on 02/10/2013.  The mammogram was normal.  On [place], there was no discreet mass other fatty lobules noted.  The left supraclavicular mass was not imaged.  Ms. Pflum has no prior history of breast disease or breast biopsy.  She had recent disk surgery on her neck by Dr. Newell Coral and he suggested my name.    Past Medical History  Diagnosis Date  . Hypertension   . Normal spontaneous vaginal delivery     2  . Aneurysm      Past Surgical History  Procedure Laterality Date  . Laparoscopic total hysterectomy  07/18/10    RSO  . Abdominal hysterectomy  2011  . Neck surgery  11/2012    c6 c7     Current Outpatient Prescriptions  Medication Sig Dispense Refill  . triamcinolone cream (KENALOG) 0.1 %        No current facility-administered medications for this visit.     No Known Allergies  REVIEW OF SYSTEMS: Skin:  No history of rash.  No history of abnormal moles. Infection:  No history of hepatitis or HIV.  No history of MRSA. Neurologic:  Recent cervical surgery, C6-C7, by Dr. Valera [place] Cardiac:  No history of hypertension. No history of heart disease.  No history of prior cardiac catheterization.  No history of seeing a cardiologist. Pulmonary:  Does not smoke cigarettes.  No asthma or bronchitis.  No OSA/CPAP.  Endocrine:  No diabetes. No thyroid disease. Gastrointestinal:  No history of stomach disease.  No history of liver disease.  No history of gall bladder disease.  No history of pancreas disease.  No history of colon disease. GYN:  TAH/BSO by Dr. Livia Snellen 07/18/2010 Urologic:  No history of kidney stones.  No history of bladder infections. Musculoskeletal:  No history of joint or back disease. Hematologic:  No bleeding disorder.  No history of anemia.  Not anticoagulated. Psycho-social:  The patient is oriented.   The patient has no obvious  psychologic or social impairment to understanding our conversation and plan.  SOCIAL and FAMILY HISTORY: Married. Husband with patient.  PHYSICAL EXAM: BP 118/72  Pulse 80  Temp(Src) 98.2 F (36.8 C)  Resp 17  Ht 5\' 8"  (1.727 m)  Wt 170 lb 0.3 oz (77.121 kg)  BMI 25.86 kg/m2  General: WN WF who is alert and generally healthy appearing.  HEENT: Normal. Pupils equal. Neck: Supple. No mass.  No thyroid mass.  Over the mid left clavicle, she has an approx 2 cm soft mass, that feels like a lipoma.  It seems too superficial for a node. Lymph Nodes:  No supraclavicular, cervical nodes, or axillary adenopathy. Lungs: Clear to auscultation and symmetric breath sounds. Heart:  RRR. No murmur or rub. Breast:  Right:   Normal  Left:  High at the t12 o'clock to 1 o'clock position, just below the clavicle, is a 3 cm rubbery mass that feels like fat.  Probable lipoma.  Ultrasound:  I did an US of the left supraclavicular mass.  There is a 2.7 x 1.1 cm homogenous mass that looks like a lipoma.  [Photo in chart/Also see under Media, Photo dated 04/13/2013.  Epic photo is not very good].  US of the high left breast mass just shows non organized subcutaneous fat.    I can't figure how to rotate the image in Epic.  DATA REVIEWED: Mammogram and [place] from 02/10/2013.  Ovidio Kin, MD,  [facility] [address] [place]  [address]   [place], [place]    [address] Phone:  [phone] FAX:  [phone]

## 2013-07-30 ENCOUNTER — Encounter: Payer: Self-pay | Admitting: Family Medicine

## 2013-07-30 ENCOUNTER — Ambulatory Visit (INDEPENDENT_AMBULATORY_CARE_PROVIDER_SITE_OTHER): Payer: BC Managed Care – PPO | Admitting: Family Medicine

## 2013-07-30 VITALS — BP 122/82 | Ht 68.0 in | Wt 177.0 lb

## 2013-07-30 DIAGNOSIS — K219 Gastro-esophageal reflux disease without esophagitis: Secondary | ICD-10-CM

## 2013-07-30 MED ORDER — AMOXICILLIN 500 MG PO TABS: ORAL_TABLET | ORAL | Status: DC

## 2013-07-30 NOTE — Progress Notes (Signed)
  Subjective:    Patient ID: [name], female    DOB: 1961/04/15, 52 y.o.   MRN: [id number]  Cough This is a new problem. The current episode started in the past 7 days. Associated symptoms include a fever, headaches, myalgias and nasal congestion.   frontal headache, dry cough ,  Some cough and discomfort,  Disc, no fever, achey,   Ears feel , no hx of fall allergies, sig exposures  Also recent flare of reflux with heartburn symptoms worse in the morning time.   Review of Systems  Constitutional: Positive for fever.  Respiratory: Positive for cough.   Musculoskeletal: Positive for myalgias.  Neurological: Positive for headaches.       Objective:   Physical Exam Alert and mild malaise. Vital stable. HEENT moderate nasal congestion frontal tenderness. Neck supple. Lungs clear heart regular in rhythm.       Assessment & Plan:  Impression rhinosinusitis #2 reflux. Plan trial of omeprazole 20 every morning. The a mocks 500 3 times a day 10 days. Symptomatic care discussed. WSL

## 2013-07-31 DIAGNOSIS — K219 Gastro-esophageal reflux disease without esophagitis: Secondary | ICD-10-CM | POA: Insufficient documentation

## 2013-08-03 ENCOUNTER — Encounter: Payer: Self-pay | Admitting: Family Medicine

## 2013-08-03 ENCOUNTER — Telehealth: Payer: Self-pay | Admitting: Family Medicine

## 2013-08-03 ENCOUNTER — Ambulatory Visit (INDEPENDENT_AMBULATORY_CARE_PROVIDER_SITE_OTHER): Payer: BC Managed Care – PPO | Admitting: Family Medicine

## 2013-08-03 VITALS — BP 112/80 | Temp 98.4°F | Ht 68.0 in | Wt 174.0 lb

## 2013-08-03 DIAGNOSIS — J019 Acute sinusitis, unspecified: Secondary | ICD-10-CM

## 2013-08-03 MED ORDER — HYDROCODONE-HOMATROPINE 5-1.5 MG/5ML PO SYRP: ORAL_SOLUTION | ORAL | Status: AC

## 2013-08-03 MED ORDER — LEVOFLOXACIN 500 MG PO TABS: 500.0000 mg | ORAL_TABLET | Freq: Every day | ORAL | Status: AC

## 2013-08-03 NOTE — Telephone Encounter (Signed)
Seen Friday and given Amoxil 500 TID for 10 days

## 2013-08-03 NOTE — Telephone Encounter (Signed)
I tend to be cautious keep appointment

## 2013-08-03 NOTE — Progress Notes (Signed)
  Subjective:    Patient ID: [name], female    DOB: [birth year], 52 y.o.   MRN: [id number]  Cough This is a new problem. The current episode started in the past 7 days. The problem has been gradually worsening. The problem occurs constantly. The cough is non-productive. Associated symptoms include nasal congestion and wheezing. Nothing aggravates the symptoms. She has tried OTC cough suppressant (antibiotic) for the symptoms. The treatment provided no relief.   PMH recent sinusitis   Review of Systems  Respiratory: Positive for cough and wheezing.        Objective:   Physical Exam  Lungs are absolutely clear no crackles no wheezing heart is regular pulses normal mild sinus tenderness eardrums normal      Assessment & Plan:  Acute sinusitis switch antibiotics. In addition to this Hycodan at nighttime for cough cautioned drowsiness plenty of rest recommend

## 2013-08-03 NOTE — Telephone Encounter (Signed)
Patient states she is still coughing and now she can not sleep at night.  She made an appointment for today @ 11:30am.  However, if Dr. Lorin Picket wants to phone something in for these symptoms she states that would be good as well.  Please call patient

## 2013-10-14 ENCOUNTER — Ambulatory Visit (INDEPENDENT_AMBULATORY_CARE_PROVIDER_SITE_OTHER): Payer: BC Managed Care – PPO | Admitting: *Deleted

## 2013-10-14 DIAGNOSIS — Z23 Encounter for immunization: Secondary | ICD-10-CM

## 2013-11-08 ENCOUNTER — Ambulatory Visit (INDEPENDENT_AMBULATORY_CARE_PROVIDER_SITE_OTHER): Payer: BC Managed Care – PPO | Admitting: Family Medicine

## 2013-11-08 ENCOUNTER — Encounter: Payer: Self-pay | Admitting: Family Medicine

## 2013-11-08 VITALS — BP 122/78 | Temp 98.2°F | Ht 68.0 in | Wt 182.0 lb

## 2013-11-08 DIAGNOSIS — J019 Acute sinusitis, unspecified: Secondary | ICD-10-CM

## 2013-11-08 MED ORDER — AMOXICILLIN 500 MG PO TABS: 500.0000 mg | ORAL_TABLET | Freq: Three times a day (TID) | ORAL | Status: AC

## 2013-11-08 NOTE — Progress Notes (Signed)
   Subjective:    Patient ID: [name], female    DOB: 1961/05/07, 53 y.o.   MRN: [id number]  Cough This is a new problem. The current episode started in the past 7 days. Associated symptoms include headaches and rhinorrhea. Pertinent negatives include no chest pain, ear pain, fever, shortness of breath or wheezing. Associated symptoms comments: Runny nose, watery eyes. Treatments tried: benadryl.  feels a lot of sinus pressure No N no V No fver  Low energy  Review of Systems  Constitutional: Negative for fever and activity change.  HENT: Positive for congestion and rhinorrhea. Negative for ear pain.   Eyes: Negative for discharge.  Respiratory: Positive for cough. Negative for shortness of breath and wheezing.   Cardiovascular: Negative for chest pain.  Neurological: Positive for headaches.       Objective:   Physical Exam  Nursing note and vitals reviewed. Constitutional: She appears well-developed.  HENT:  Head: Normocephalic.  Nose: Nose normal.  Mouth/Throat: Oropharynx is clear and moist. No oropharyngeal exudate.  Neck: Neck supple.  Cardiovascular: Normal rate and normal heart sounds.   No murmur heard. Pulmonary/Chest: Effort normal and breath sounds normal. She has no wheezes.  Lymphadenopathy:    She has no cervical adenopathy.  Skin: Skin is warm and dry.          Assessment & Plan:   Acute sinusitis amoxil tid 10 days

## 2014-02-15 ENCOUNTER — Encounter: Payer: Self-pay | Admitting: Family Medicine

## 2014-02-15 ENCOUNTER — Ambulatory Visit (INDEPENDENT_AMBULATORY_CARE_PROVIDER_SITE_OTHER): Payer: BC Managed Care – PPO | Admitting: Family Medicine

## 2014-02-15 VITALS — BP 110/70 | Temp 98.3°F | Ht 68.0 in

## 2014-02-15 DIAGNOSIS — R143 Flatulence: Secondary | ICD-10-CM

## 2014-02-15 DIAGNOSIS — R109 Unspecified abdominal pain: Secondary | ICD-10-CM

## 2014-02-15 DIAGNOSIS — R141 Gas pain: Secondary | ICD-10-CM

## 2014-02-15 DIAGNOSIS — R142 Eructation: Secondary | ICD-10-CM

## 2014-02-15 DIAGNOSIS — R140 Abdominal distension (gaseous): Secondary | ICD-10-CM

## 2014-02-15 LAB — CBC WITH DIFFERENTIAL/PLATELET
BASOS ABS: 0 10*3/uL (ref 0.0–0.1)
Basophils Relative: 0 % (ref 0–1)
Eosinophils Absolute: 0.2 10*3/uL (ref 0.0–0.7)
Eosinophils Relative: 3 % (ref 0–5)
HCT: 42.4 % (ref 36.0–46.0)
Hemoglobin: 14.5 g/dL (ref 12.0–15.0)
LYMPHS ABS: 2.6 10*3/uL (ref 0.7–4.0)
LYMPHS PCT: 32 % (ref 12–46)
MCH: 29.6 pg (ref 26.0–34.0)
MCHC: 34.2 g/dL (ref 30.0–36.0)
MCV: 86.5 fL (ref 78.0–100.0)
Monocytes Absolute: 0.6 10*3/uL (ref 0.1–1.0)
Monocytes Relative: 7 % (ref 3–12)
NEUTROS ABS: 4.6 10*3/uL (ref 1.7–7.7)
NEUTROS PCT: 58 % (ref 43–77)
PLATELETS: 328 10*3/uL (ref 150–400)
RBC: 4.9 MIL/uL (ref 3.87–5.11)
RDW: 13.6 % (ref 11.5–15.5)
WBC: 8 10*3/uL (ref 4.0–10.5)

## 2014-02-15 LAB — HEPATIC FUNCTION PANEL
ALK PHOS: 55 U/L (ref 39–117)
ALT: 20 U/L (ref 0–35)
AST: 17 U/L (ref 0–37)
Albumin: 4 g/dL (ref 3.5–5.2)
BILIRUBIN INDIRECT: 0.5 mg/dL (ref 0.2–1.2)
BILIRUBIN TOTAL: 0.6 mg/dL (ref 0.2–1.2)
Bilirubin, Direct: 0.1 mg/dL (ref 0.0–0.3)
Total Protein: 7 g/dL (ref 6.0–8.3)

## 2014-02-15 LAB — LIPASE: Lipase: 15 U/L (ref 0–75)

## 2014-02-15 NOTE — Progress Notes (Signed)
   Subjective:    Patient ID: [name], female    DOB: 01-27-1961, 53 y.o.   MRN: [id number]  Abdominal Pain This is a new problem. The current episode started more than 1 month ago. The onset quality is gradual. The problem occurs intermittently. The problem has been unchanged. The pain is located in the generalized abdominal region. The pain is at a severity of 0/10. The patient is experiencing no pain. The quality of the pain is a sensation of fullness. The abdominal pain does not radiate. Pertinent negatives include no dysuria or frequency. The pain is aggravated by eating. The pain is relieved by nothing. She has tried nothing for the symptoms. The treatment provided no relief.   Still has one ovary Feels bloated after eating a small amount Has varied diet without much help BM no blood No mucous  no nocturnal Sx Hx of some reflux, clear phlegm in the am Patient states he has no other concerns at this time.   Review of Systems  Constitutional: Negative for activity change, appetite change and fatigue.  HENT: Negative for congestion.   Respiratory: Negative for cough and shortness of breath.   Cardiovascular: Negative for chest pain.  Gastrointestinal: Positive for abdominal pain. Negative for blood in stool.  Endocrine: Negative for polydipsia and polyphagia.  Genitourinary: Negative for dysuria and frequency.  Neurological: Negative for weakness.  Psychiatric/Behavioral: Negative for confusion.   [phone]     Objective:   Physical Exam  Vitals reviewed. Constitutional: She appears well-nourished. No distress.  Cardiovascular: Normal rate, regular rhythm and normal heart sounds.   No murmur heard. Pulmonary/Chest: Effort normal and breath sounds normal. No respiratory distress.  Abdominal: Soft. There is no tenderness.  Musculoskeletal: She exhibits no edema.  Lymphadenopathy:    She has no cervical adenopathy.  Neurological: She is alert. She exhibits normal  muscle tone.  Psychiatric: Her behavior is normal.          Assessment & Plan:  1. Abdominal bloating I doubt ovarian cancer. But could be gallstones versus other problems Will check lab work - CBC with Differential - Hepatic function panel - H. pylori antibody, IgG - Tissue transglutaminase, IgA - Lipase  2. Abdominal pain, unspecified site After ultrasound may need a HIDA test. CAT scan back in 2007 did show gallstones. - CBC with Differential - Hepatic function panel - H. pylori antibody, IgG - Tissue transglutaminase, IgA - Lipase - US Abdomen Complete

## 2014-02-16 LAB — H. PYLORI ANTIBODY, IGG: H Pylori IgG: <0.4 {ISR}

## 2014-02-16 LAB — TISSUE TRANSGLUTAMINASE, IGA: TISSUE TRANSGLUTAMINASE AB, IGA: 1.8 U/mL (ref <20)

## 2014-02-18 ENCOUNTER — Ambulatory Visit (HOSPITAL_COMMUNITY)
Admission: RE | Admit: 2014-02-18 | Discharge: 2014-02-18 | Disposition: A | Discharge status: Home / Self Care | Payer: BC Managed Care – PPO | Source: Ambulatory Visit | Attending: Family Medicine | Admitting: Family Medicine

## 2014-02-18 DIAGNOSIS — K8020 Calculus of gallbladder without cholecystitis without obstruction: Secondary | ICD-10-CM | POA: Insufficient documentation

## 2014-02-21 ENCOUNTER — Other Ambulatory Visit: Payer: Self-pay

## 2014-02-21 DIAGNOSIS — R109 Unspecified abdominal pain: Secondary | ICD-10-CM

## 2014-03-01 ENCOUNTER — Encounter (HOSPITAL_COMMUNITY)
Admission: RE | Admit: 2014-03-01 | Discharge: 2014-03-01 | Disposition: A | Discharge status: Home / Self Care | Payer: BC Managed Care – PPO | Source: Ambulatory Visit | Attending: Family Medicine | Admitting: Family Medicine

## 2014-03-01 ENCOUNTER — Encounter (HOSPITAL_COMMUNITY): Payer: Self-pay

## 2014-03-01 ENCOUNTER — Other Ambulatory Visit (HOSPITAL_COMMUNITY): Payer: BC Managed Care – PPO

## 2014-03-01 DIAGNOSIS — R109 Unspecified abdominal pain: Secondary | ICD-10-CM | POA: Insufficient documentation

## 2014-03-01 MED ORDER — STERILE WATER FOR INJECTION IJ SOLN
10.0000 mL | Freq: Once | INTRAMUSCULAR | Status: AC
  Administered 2014-03-01: 10 mL via INTRAMUSCULAR

## 2014-03-01 MED ORDER — SINCALIDE 5 MCG IJ SOLR
INTRAMUSCULAR | Status: AC
  Administered 2014-03-01: 1.55 ug
  Filled 2014-03-01: qty 5

## 2014-03-01 MED ORDER — TECHNETIUM TC 99M MEBROFENIN IV KIT
5.0000 | PACK | Freq: Once | INTRAVENOUS | Status: AC | PRN
  Administered 2014-03-01: 5 via INTRAVENOUS

## 2014-03-02 ENCOUNTER — Other Ambulatory Visit: Payer: Self-pay

## 2014-03-02 DIAGNOSIS — Z1231 Encounter for screening mammogram for malignant neoplasm of breast: Secondary | ICD-10-CM

## 2014-03-07 ENCOUNTER — Other Ambulatory Visit: Payer: Self-pay | Admitting: Family Medicine

## 2014-03-07 ENCOUNTER — Telehealth: Payer: Self-pay | Admitting: Family Medicine

## 2014-03-07 DIAGNOSIS — R140 Abdominal distension (gaseous): Secondary | ICD-10-CM

## 2014-03-07 NOTE — Progress Notes (Signed)
This patient having nausea and bloating mainly bloating after eating. Very uncomfortable feeling she is avoiding eating because of this. We will go ahead and set her up. Her HIDA test is abnormal. She may well benefit from having gallbladder removed.

## 2014-03-07 NOTE — Telephone Encounter (Signed)
I discussed the ultrasound and lab work with her. I do not have any results of the mammogram currently.

## 2014-03-07 NOTE — Telephone Encounter (Signed)
Patient calling to check on results to hida scan, blood work, and mammogram.

## 2014-03-07 NOTE — Telephone Encounter (Signed)
Please call patient at [phone]

## 2014-03-07 NOTE — Telephone Encounter (Signed)
Calling to let you know that she is seeing Dr. Lucia Gaskins already at [facility].

## 2014-03-07 NOTE — Telephone Encounter (Signed)
She will need an appointment with Dr. Lucia Gaskins at [facility] surgery. This is for gallbladder dysfunction. Please assist her with setting this up. She is already a patient with him. Set up the appointment and inform patient please thanks

## 2014-03-08 ENCOUNTER — Other Ambulatory Visit (INDEPENDENT_AMBULATORY_CARE_PROVIDER_SITE_OTHER): Payer: Self-pay | Admitting: *Deleted

## 2014-03-08 ENCOUNTER — Encounter (INDEPENDENT_AMBULATORY_CARE_PROVIDER_SITE_OTHER): Payer: Self-pay | Admitting: *Deleted

## 2014-03-08 DIAGNOSIS — Z1211 Encounter for screening for malignant neoplasm of colon: Secondary | ICD-10-CM

## 2014-03-09 ENCOUNTER — Ambulatory Visit
Admission: RE | Admit: 2014-03-09 | Discharge: 2014-03-09 | Disposition: A | Discharge status: Home / Self Care | Payer: BC Managed Care – PPO | Source: Ambulatory Visit

## 2014-03-09 DIAGNOSIS — Z1231 Encounter for screening mammogram for malignant neoplasm of breast: Secondary | ICD-10-CM

## 2014-03-09 NOTE — Telephone Encounter (Signed)
Appt was already scheduled for 03/17/14 with Dr. Lucia Gaskins, pt's aware

## 2014-03-11 ENCOUNTER — Telehealth: Payer: Self-pay | Admitting: Family Medicine

## 2014-03-11 MED ORDER — ONDANSETRON HCL 8 MG PO TABS: 8.0000 mg | ORAL_TABLET | Freq: Three times a day (TID) | ORAL | Status: DC | PRN

## 2014-03-11 MED ORDER — HYDROCODONE-ACETAMINOPHEN 5-325 MG PO TABS: 1.0000 | ORAL_TABLET | ORAL | Status: DC | PRN

## 2014-03-11 NOTE — Telephone Encounter (Signed)
Patient was notified. Zofran sent in to pharmacy. Patient decided to go with Hydrocodone and script was printed and patient picked up script.

## 2014-03-11 NOTE — Telephone Encounter (Signed)
Zofran 8 mg 1 3 times a day when necessary nausea, #18. Talked with the patient. S note there are options regarding the pain and discomfort. She can either use tramadol or hydrocodone. Please see if she has a preference. In addition to this I believe this patient would benefit from being aware that if her pain becomes very severe and certainly if there is any severe pain with fever to going to the emergency department in [place]. Please discuss with the patient and see what she would like to do in regards to pain.

## 2014-03-11 NOTE — Telephone Encounter (Signed)
Patient has been having issues with her gallbladder in the past which she says Dr. Nicki Reaper knows all about. She said that last night she started feeling a little nauseas and this morning she was experiencing pain and nausea. She wants to know if we could possibly call something in because she don't want to deal with it through the weekend.  [name]

## 2014-03-17 ENCOUNTER — Ambulatory Visit (INDEPENDENT_AMBULATORY_CARE_PROVIDER_SITE_OTHER): Payer: BC Managed Care – PPO | Admitting: Surgery

## 2014-03-17 ENCOUNTER — Encounter (INDEPENDENT_AMBULATORY_CARE_PROVIDER_SITE_OTHER): Payer: Self-pay | Admitting: Surgery

## 2014-03-17 VITALS — BP 120/70 | HR 73 | Temp 97.8°F | Ht 68.0 in | Wt 183.0 lb

## 2014-03-17 DIAGNOSIS — R222 Localized swelling, mass and lump, trunk: Secondary | ICD-10-CM

## 2014-03-17 DIAGNOSIS — K8020 Calculus of gallbladder without cholecystitis without obstruction: Secondary | ICD-10-CM | POA: Insufficient documentation

## 2014-03-17 NOTE — Progress Notes (Signed)
Re:   [name] DOB:   Mar 04, 1961 MRN:   [id number]  ASSESSMENT AND PLAN: 1.  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  There is no reason for me to follow this unless there is an issue.  Because this is close to the breast, she wants to see me again in a year.  2.  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 3.  Left neck surgery - for which she has done well. 4.  Gallstones, symptomatic  I discussed with the patient the indications and risks of gall bladder surgery.  The primary risks of gall bladder surgery include, but are not limited to, bleeding, infection, common bile duct injury, and open surgery.  There is also the risk that the patient may have continued symptoms after surgery.  However, the likelihood of improvement in symptoms and return to the patient's normal status is good. We discussed the typical post-operative recovery course. I tried to answer the patient's questions.  I gave the patient literature about gall bladder surgery.  She has a colonoscopy scheduled by Dr. Laural Golden in Sept, 2015.  She is going to discuss this with him.  He could see her for the dyspepsia and consider upper endo.  Chief Complaint  Patient presents with  . eval chest wall   REFERRING PHYSICIAN: LUKING,SCOTT, MD  HISTORY OF PRESENT ILLNESS: TERIN DIEROLF is a 53 y.o. (DOB: [birth year])  white  female whose primary care physician is LUKING,SCOTT, MD and comes to me today for 2 masses - on left supraclavicular and one left upper breast.   She is accompanied with her husband.  She comes for follow up of left upper chest mass.  She has put on 13 pounds, but she thinks that this mass is unchanged. She also has felt bloated. She does not feel much pain. She is undergone an evaluation by Dr. Sallee Lange which includes an ultrasound of the gallbladder and HIDA scan. This showed a single gallstone with a poor  ejection fraction.  History of left clavicular mass: Ms. Santizo noticed a mass in the superior aspect of her left breast for at least 9 years.  It has not changed on her exam during that time.  More recently, about 4 months, she noticed a mass over the left clavicle.  It has not changed much since she first noticed it.  Neither mass is painful or bothering her.  She had a mammogram and [place] on 02/10/2013.  The mammogram was normal.  On [place], there was no discreet mass other fatty lobules noted.  The left supraclavicular mass was not imaged.  Ms. Brumbaugh has no prior history of breast disease or breast biopsy.  She had recent disk surgery on her neck by Dr. Sherwood Gambler and he suggested my name.   Past Medical History  Diagnosis Date  . Hypertension   . Normal spontaneous vaginal delivery     2  . Aneurysm      Past Surgical History  Procedure Laterality Date  . Laparoscopic total hysterectomy  07/18/10    RSO  . Abdominal hysterectomy  2011  . Neck surgery  11/2012    c6 c7    Current Outpatient Prescriptions  Medication Sig Dispense Refill  . HYDROcodone-acetaminophen (NORCO/VICODIN) 5-325 MG per tablet Take 1 tablet by mouth every 4 (four) hours as needed.  35 tablet  0  .  ondansetron (ZOFRAN) 8 MG tablet Take 1 tablet (8 mg total) by mouth 3 (three) times daily as needed for nausea or vomiting.  18 tablet  0  . triamcinolone cream (KENALOG) 0.1 %        No current facility-administered medications for this visit.     No Known Allergies  REVIEW OF SYSTEMS: Neurologic:  Recent cervical surgery, C6-C7, by Dr. Modena Nunnery Gastrointestinal:  No history of stomach disease.  No history of liver disease.  History of gall stones.   No history of pancreas disease.  No history of colon disease. GYN:  TAH/BSO by Dr. Hebert Soho 07/18/2010  SOCIAL and FAMILY HISTORY: Married. Husband with patient.  PHYSICAL EXAM: BP 120/70  Pulse 73  Temp(Src) 97.8 F (36.6 C)  Ht 5\' 8"  (1.727 m)  Wt  183 lb (83.008 kg)  BMI 27.83 kg/m2  General: WN WF who is alert and generally healthy appearing.  HEENT: Normal. Pupils equal. Neck: Supple. No mass.  No thyroid mass.  Over the mid left clavicle, she has an approx 2 cm soft mass, that feels like a lipoma.  It seems too superficial for a node.  It is felt best with her sitting. Lymph Nodes:  No supraclavicular, cervical nodes, or axillary adenopathy. Lungs: Clear to auscultation and symmetric breath sounds. Heart:  RRR. No murmur or rub. Breast:  Right:  Normal  Left:  High at the 12 o'clock to 1 o'clock position, just below the clavicle, is a 3 cm rubbery mass that feels like fat.  Probable lipoma. Abdomen:  Benign.  No mass or hernia.  DATA REVIEWED: Mammogram and [place] ([facility]) from 03/09/2014.  Alphonsa Overall, MD,  [facility], [address]  [place], [place]    [place] Phone:  [phone] FAX:  [phone]

## 2014-05-09 ENCOUNTER — Encounter (INDEPENDENT_AMBULATORY_CARE_PROVIDER_SITE_OTHER): Payer: Self-pay | Admitting: *Deleted

## 2014-05-09 ENCOUNTER — Telehealth (INDEPENDENT_AMBULATORY_CARE_PROVIDER_SITE_OTHER): Payer: Self-pay | Admitting: *Deleted

## 2014-05-09 DIAGNOSIS — Z1211 Encounter for screening for malignant neoplasm of colon: Secondary | ICD-10-CM

## 2014-05-09 NOTE — Telephone Encounter (Signed)
Patient needs movi prep 

## 2014-05-11 MED ORDER — PEG-KCL-NACL-NASULF-NA ASC-C 100 G PO SOLR: 1.0000 | Freq: Once | ORAL | Status: DC

## 2014-05-23 ENCOUNTER — Encounter (HOSPITAL_COMMUNITY): Payer: Self-pay | Admitting: Pharmacy Technician

## 2014-05-26 ENCOUNTER — Telehealth (INDEPENDENT_AMBULATORY_CARE_PROVIDER_SITE_OTHER): Payer: Self-pay | Admitting: *Deleted

## 2014-05-26 NOTE — Telephone Encounter (Signed)
agree

## 2014-05-26 NOTE — Telephone Encounter (Signed)
  Procedure: tcs  Reason/Indication:  screening  Has patient had this procedure before?  no  If so, when, by whom and where?    Is there a family history of colon cancer?  no  Who?  What age when diagnosed?    Is patient diabetic?   no      Does patient have prosthetic heart valve?  no  Do you have a pacemaker?  no  Has patient ever had endocarditis? no  Has patient had joint replacement within last 12 months?  no  Does patient tend to be constipated or take laxatives? no  Is patient on Coumadin, Plavix and/or Aspirin? no  Medications: none  Allergies: nkda  Medication Adjustment:   Procedure date & time: 06/09/14 at 830

## 2014-06-08 ENCOUNTER — Other Ambulatory Visit (INDEPENDENT_AMBULATORY_CARE_PROVIDER_SITE_OTHER): Payer: Self-pay | Admitting: *Deleted

## 2014-06-08 DIAGNOSIS — Z1211 Encounter for screening for malignant neoplasm of colon: Secondary | ICD-10-CM

## 2014-06-09 ENCOUNTER — Encounter (HOSPITAL_COMMUNITY)
Admission: RE | Disposition: A | Discharge status: Home / Self Care | Payer: Self-pay | Source: Ambulatory Visit | Attending: Internal Medicine

## 2014-06-09 ENCOUNTER — Encounter (HOSPITAL_COMMUNITY): Payer: Self-pay | Admitting: *Deleted

## 2014-06-09 ENCOUNTER — Ambulatory Visit (HOSPITAL_COMMUNITY)
Admission: RE | Admit: 2014-06-09 | Discharge: 2014-06-09 | Disposition: A | Discharge status: Home / Self Care | Payer: BC Managed Care – PPO | Source: Ambulatory Visit | Attending: Internal Medicine | Admitting: Internal Medicine

## 2014-06-09 DIAGNOSIS — D126 Benign neoplasm of colon, unspecified: Secondary | ICD-10-CM | POA: Insufficient documentation

## 2014-06-09 DIAGNOSIS — Z1211 Encounter for screening for malignant neoplasm of colon: Secondary | ICD-10-CM | POA: Insufficient documentation

## 2014-06-09 HISTORY — PX: COLONOSCOPY: SHX5424

## 2014-06-09 SURGERY — COLONOSCOPY
Anesthesia: Moderate Sedation

## 2014-06-09 MED ORDER — MIDAZOLAM HCL 5 MG/5ML IJ SOLN
INTRAMUSCULAR | Status: DC | PRN
  Administered 2014-06-09 (×4): 2 mg via INTRAVENOUS

## 2014-06-09 MED ORDER — STERILE WATER FOR IRRIGATION IR SOLN
Status: DC | PRN
  Administered 2014-06-09: 10:00:00

## 2014-06-09 MED ORDER — MIDAZOLAM HCL 5 MG/5ML IJ SOLN
INTRAMUSCULAR | Status: AC
  Filled 2014-06-09: qty 10

## 2014-06-09 MED ORDER — MEPERIDINE HCL 50 MG/ML IJ SOLN
INTRAMUSCULAR | Status: DC | PRN
  Administered 2014-06-09 (×2): 25 mg via INTRAVENOUS

## 2014-06-09 MED ORDER — SODIUM CHLORIDE 0.9 % IV SOLN
INTRAVENOUS | Status: DC
  Administered 2014-06-09: 09:00:00 via INTRAVENOUS

## 2014-06-09 MED ORDER — MEPERIDINE HCL 50 MG/ML IJ SOLN
INTRAMUSCULAR | Status: AC
  Filled 2014-06-09: qty 1

## 2014-06-09 NOTE — H&P (Signed)
[name] is an 53 y.o. female.   Chief Complaint: Patient is here for colonoscopy. HPI: Patient is 53 year old Caucasian female who is here for screening colonoscopy. She denies abdominal pain change in her bowel habits or rectal bleeding. Family history is negative for CRC.  Past Medical History  Diagnosis Date  . Normal spontaneous vaginal delivery     2  . Aneurysm     Past Surgical History  Procedure Laterality Date  . Laparoscopic total hysterectomy  07/18/10    RSO  . Abdominal hysterectomy  2011  . Neck surgery  11/2012    c6 c7    Family History  Problem Relation Age of Onset  . Heart disease Mother   . Colon cancer Neg Hx    Social History:  reports that she has never smoked. She does not have any smokeless tobacco history on file. She reports that she drinks alcohol. She reports that she does not use illicit drugs.  Allergies: No Known Allergies  Medications Prior to Admission  Medication Sig Dispense Refill  . peg 3350 powder (MOVIPREP) 100 G SOLR Take 1 kit (200 g total) by mouth once.  1 kit  0    No results found for this or any previous visit (from the past 48 hour(s)). No results found.  ROS  Blood pressure 134/70, pulse 80, temperature 98 F (36.7 C), temperature source Oral, resp. rate 22, height '5\' 8"'  (1.727 m), weight 175 lb (79.379 kg), SpO2 97.00%. Physical Exam  Constitutional: She appears well-developed and well-nourished.  HENT:  Mouth/Throat: Oropharynx is clear and moist.  Eyes: Conjunctivae are normal. No scleral icterus.  Neck: No thyromegaly present.  Cardiovascular: Normal rate, regular rhythm and normal heart sounds.   No murmur heard. Respiratory: Effort normal and breath sounds normal.  GI: Soft. She exhibits no distension and no mass. There is no tenderness.  Musculoskeletal: She exhibits no edema.  Lymphadenopathy:    She has no cervical adenopathy.  Neurological: She is alert.  Skin: Skin is warm and dry.      Assessment/Plan Average risk screening colonoscopy.  REHMAN,NAJEEB U 06/09/2014, 10:02 AM

## 2014-06-09 NOTE — Discharge Instructions (Signed)
Resume usual diet. No driving for 24 hours. Physician will call with biopsy results.  Colonoscopy, Care After Refer to this sheet in the next few weeks. These instructions provide you with information on caring for yourself after your procedure. Your health care provider may also give you more specific instructions. Your treatment has been planned according to current medical practices, but problems sometimes occur. Call your health care provider if you have any problems or questions after your procedure. WHAT TO EXPECT AFTER THE PROCEDURE  After your procedure, it is typical to have the following:  A small amount of blood in your stool.  Moderate amounts of gas and mild abdominal cramping or bloating. HOME CARE INSTRUCTIONS  Do not drive, operate machinery, or sign important documents for 24 hours.  You may shower and resume your regular physical activities, but move at a slower pace for the first 24 hours.  Take frequent rest periods for the first 24 hours.  Walk around or put a warm pack on your abdomen to help reduce abdominal cramping and bloating.  Drink enough fluids to keep your urine clear or pale yellow.  You may resume your normal diet as instructed by your health care provider. Avoid heavy or fried foods that are hard to digest.  Avoid drinking alcohol for 24 hours or as instructed by your health care provider.  Only take over-the-counter or prescription medicines as directed by your health care provider.  If a tissue sample (biopsy) was taken during your procedure:  Do not take aspirin or blood thinners for 7 days, or as instructed by your health care provider.  Do not drink alcohol for 7 days, or as instructed by your health care provider.  Eat soft foods for the first 24 hours. SEEK MEDICAL CARE IF: You have persistent spotting of blood in your stool 2-3 days after the procedure. SEEK IMMEDIATE MEDICAL CARE IF:  You have more than a small spotting of blood in  your stool.  You pass large blood clots in your stool.  Your abdomen is swollen (distended).  You have nausea or vomiting.  You have a fever.  You have increasing abdominal pain that is not relieved with medicine. Document Released: 05/07/2004 Document Revised: 07/14/2013 Document Reviewed: 05/31/2013 [facility] Patient Information 2015 [place], [place]. This information is not intended to replace advice given to you by your health care provider. Make sure you discuss any questions you have with your health care provider.

## 2014-06-09 NOTE — Op Note (Signed)
COLONOSCOPY PROCEDURE REPORT  PATIENT:  [name]  MR#:  [id number] Birthdate:  August 08, 1961, 53 y.o., female Endoscopist:  Dr. Rogene Houston, MD Referred By:  Dr. Sallee Lange, MD Procedure Date: 06/09/2014  Procedure:   Colonoscopy  Indications:  Patient is 53 year old Caucasian female who is undergoing average risk screening colonoscopy.  Informed Consent:  The procedure and risks were reviewed with the patient and informed consent was obtained.  Medications:  Demerol 50 mg IV Versed 8 mg IV  Description of procedure:  After a digital rectal exam was performed, that colonoscope was advanced from the anus through the rectum and colon to the area of the cecum, ileocecal valve and appendiceal orifice. The cecum was deeply intubated. These structures were well-seen and photographed for the record. From the level of the cecum and ileocecal valve, the scope was slowly and cautiously withdrawn. The mucosal surfaces were carefully surveyed utilizing scope tip to flexion to facilitate fold flattening as needed. The scope was pulled down into the rectum where a thorough exam including retroflexion was performed.  Findings:   Prep satisfactory. Two small polyps ablated while biopsy from the sigmoid colon submitted together. Normal rectal mucosa and anorectal junction.    Therapeutic/Diagnostic Maneuvers Performed:  See above  Complications:  None  Cecal Withdrawal Time:  8 minutes  Impression:  Examination performed to cecum. Two small polyps ablated via biopsy from sigmoid colon.  Recommendations:  Standard instructions given. I will contact patient with biopsy results and further recommendations.  REHMAN,NAJEEB U  06/09/2014 10:37 AM  CC: Dr. Sallee Lange, MD & Dr. Rayne Du ref. provider found

## 2014-06-14 ENCOUNTER — Encounter (HOSPITAL_COMMUNITY): Payer: Self-pay | Admitting: Internal Medicine

## 2014-09-12 ENCOUNTER — Encounter: Payer: Self-pay | Admitting: Family Medicine

## 2014-09-12 ENCOUNTER — Ambulatory Visit (INDEPENDENT_AMBULATORY_CARE_PROVIDER_SITE_OTHER): Payer: BC Managed Care – PPO | Admitting: Family Medicine

## 2014-09-12 VITALS — BP 112/70 | Ht 68.0 in | Wt 182.1 lb

## 2014-09-12 DIAGNOSIS — Z13220 Encounter for screening for lipoid disorders: Secondary | ICD-10-CM

## 2014-09-12 DIAGNOSIS — R5383 Other fatigue: Secondary | ICD-10-CM

## 2014-09-12 MED ORDER — CITALOPRAM HYDROBROMIDE 10 MG PO TABS: 10.0000 mg | ORAL_TABLET | Freq: Every day | ORAL | Status: DC

## 2014-09-12 NOTE — Progress Notes (Signed)
   Subjective:    Patient ID: [name], female    DOB: 1961-01-17, 53 y.o.   MRN: [id number]  HPI Patient states that she wants to talk to Dr. Nicki Reaper about a few personal issues.    Review of Systems     Objective:   Physical Exam Neck no masses no thyromegaly. Lungs are clear no crackles heart regular extremities no edema  Blood pressure stable     Assessment & Plan:  1. Screening cholesterol level Patient he healthy stay physically active - Lipid panel  2. Other fatigue I believe that this is related into the stress from work at least 5 days out of 7 she feels overwhelmed anxious nervous and finds herself having a difficult time staying focused because of it and is also causing some fatigue and tiredness I recommend that we try a low-dose Celexa 10 mg daily. I also advised the patient if she has ongoing troubles she may need some counseling she denies being depressed currently but does have quite a few spells of being anxious. She will follow-up in 4-6 weeks. - TSH - Glucose, random

## 2014-10-17 ENCOUNTER — Encounter: Payer: Self-pay | Admitting: Family Medicine

## 2014-10-17 ENCOUNTER — Ambulatory Visit (INDEPENDENT_AMBULATORY_CARE_PROVIDER_SITE_OTHER): Payer: BC Managed Care – PPO | Admitting: Family Medicine

## 2014-10-17 VITALS — BP 122/78 | Ht 68.0 in | Wt 179.0 lb

## 2014-10-17 DIAGNOSIS — R5383 Other fatigue: Secondary | ICD-10-CM

## 2014-10-17 MED ORDER — CITALOPRAM HYDROBROMIDE 20 MG PO TABS: 20.0000 mg | ORAL_TABLET | Freq: Every day | ORAL | Status: DC

## 2014-10-17 NOTE — Progress Notes (Signed)
   Subjective:    Patient ID: [name], female    DOB: 01/22/1961, 54 y.o.   MRN: [id number]  HPI Patient is here today for a f/u on 12/7 visit.  She was here for fatigue.  Pt started Celexa. She said it is working for her. She thinks that the dose works well for her.  No other concerns.   PMH benign  Review of Systems Denies headaches or other problems no fevers.    Objective:   Physical Exam Lungs clear heart regular  15 minutes spent with the patient discussing multiple issues     Assessment & Plan:  Stress anxiety issues actually doing somewhat better than what she was no need for any other interventions increase citalopram from 10-20 mg this should help  Patient will let us know if the medication is not working out  Patient is having some fatigue I believe this is related to the overall condition she will get her lab work done as previously ordered

## 2015-01-25 ENCOUNTER — Other Ambulatory Visit: Payer: Self-pay | Admitting: Surgery

## 2015-01-25 DIAGNOSIS — Z9289 Personal history of other medical treatment: Secondary | ICD-10-CM

## 2015-01-25 DIAGNOSIS — D171 Benign lipomatous neoplasm of skin and subcutaneous tissue of trunk: Secondary | ICD-10-CM

## 2015-03-13 ENCOUNTER — Other Ambulatory Visit: Payer: BC Managed Care – PPO

## 2015-03-13 ENCOUNTER — Ambulatory Visit: Payer: BC Managed Care – PPO | Admitting: Family Medicine

## 2015-03-13 ENCOUNTER — Ambulatory Visit
Admission: RE | Admit: 2015-03-13 | Discharge: 2015-03-13 | Disposition: A | Discharge status: Home / Self Care | Payer: BC Managed Care – PPO | Source: Ambulatory Visit | Attending: Surgery | Admitting: Surgery

## 2015-03-27 ENCOUNTER — Other Ambulatory Visit: Payer: Self-pay | Admitting: Family Medicine

## 2015-04-03 ENCOUNTER — Encounter: Payer: Self-pay | Admitting: Family Medicine

## 2015-04-03 DIAGNOSIS — D1720 Benign lipomatous neoplasm of skin and subcutaneous tissue of unspecified limb: Secondary | ICD-10-CM | POA: Insufficient documentation

## 2015-07-03 ENCOUNTER — Ambulatory Visit (INDEPENDENT_AMBULATORY_CARE_PROVIDER_SITE_OTHER): Payer: BC Managed Care – PPO | Admitting: *Deleted

## 2015-07-03 DIAGNOSIS — Z23 Encounter for immunization: Secondary | ICD-10-CM | POA: Diagnosis not present

## 2015-08-22 ENCOUNTER — Encounter: Payer: Self-pay | Admitting: Family Medicine

## 2015-08-22 ENCOUNTER — Ambulatory Visit (INDEPENDENT_AMBULATORY_CARE_PROVIDER_SITE_OTHER): Payer: BC Managed Care – PPO | Admitting: Family Medicine

## 2015-08-22 VITALS — BP 104/70 | Temp 97.9°F | Ht 68.0 in | Wt 191.0 lb

## 2015-08-22 DIAGNOSIS — D1720 Benign lipomatous neoplasm of skin and subcutaneous tissue of unspecified limb: Secondary | ICD-10-CM

## 2015-08-22 DIAGNOSIS — L259 Unspecified contact dermatitis, unspecified cause: Secondary | ICD-10-CM | POA: Diagnosis not present

## 2015-08-22 MED ORDER — PREDNISONE 20 MG PO TABS: ORAL_TABLET | ORAL | Status: DC

## 2015-08-22 NOTE — Progress Notes (Signed)
   Subjective:    Patient ID: [name], female    DOB: November 30, 1960, 54 y.o.   MRN: [id number]  Eye Pain  Both eyes are affected.This is a new problem. The current episode started yesterday. The problem has been unchanged. There was no injury mechanism. The pain is moderate. Associated symptoms include itching. Associated symptoms comments: Swelling, nausea. She has tried nothing for the symptoms. The treatment provided no relief.   Patient is complaining of nausea also. Onset 2 days ago.   No other concerns at this time.  Review of Systems  Eyes: Positive for pain.  Skin: Positive for itching.       Objective:   Physical Exam  The facial area she does have some swelling around the eyes nothing severe. Some swelling around underneath part of the eyes as well the eyeball itself looks fine. Neck no masses she does have a lipoma along the left side of the supraclavicular area soft freely movable approximately 1 inch by an inch and a half has been seen by surgery in the past that did not recommend removal.      Assessment & Plan:  Patient with possible gallbladder dysfunction she does not want to go to surgery currently she states she will work with this if it starts happening more she will follow-up sooner  Contact dermatitis around the eyes prednisone taper over the next 6 day should gradually get things doing better  Screening lab work for cholesterol and sugar patient defers on this currently

## 2015-10-10 ENCOUNTER — Encounter: Payer: Self-pay | Admitting: Family Medicine

## 2015-10-10 ENCOUNTER — Ambulatory Visit (INDEPENDENT_AMBULATORY_CARE_PROVIDER_SITE_OTHER): Payer: BC Managed Care – PPO | Admitting: Family Medicine

## 2015-10-10 VITALS — Temp 99.0°F

## 2015-10-10 DIAGNOSIS — J019 Acute sinusitis, unspecified: Secondary | ICD-10-CM

## 2015-10-10 DIAGNOSIS — B338 Other specified viral diseases: Secondary | ICD-10-CM

## 2015-10-10 DIAGNOSIS — B9689 Other specified bacterial agents as the cause of diseases classified elsewhere: Secondary | ICD-10-CM

## 2015-10-10 DIAGNOSIS — B348 Other viral infections of unspecified site: Secondary | ICD-10-CM

## 2015-10-10 MED ORDER — CITALOPRAM HYDROBROMIDE 20 MG PO TABS: 20.0000 mg | ORAL_TABLET | Freq: Every day | ORAL | Status: DC

## 2015-10-10 MED ORDER — HYDROCODONE-HOMATROPINE 5-1.5 MG/5ML PO SYRP: 5.0000 mL | ORAL_SOLUTION | Freq: Four times a day (QID) | ORAL | Status: DC | PRN

## 2015-10-10 MED ORDER — AMOXICILLIN 500 MG PO TABS: 500.0000 mg | ORAL_TABLET | Freq: Three times a day (TID) | ORAL | Status: DC

## 2015-10-10 NOTE — Progress Notes (Signed)
   Subjective:    Patient ID: [name], female    DOB: Dec 19, 1960, 55 y.o.   MRN: [id number]  Cough This is a new problem. The current episode started yesterday. Associated symptoms include a fever. Associated symptoms comments: Scratchy throat.    Patient relates burning in throat head congestion drainage coughing symptoms over the past couple days low-grade fever. Cough keeping her awake at night no shortness of breath no wheezing no nausea vomiting or diarrhea  Review of Systems  Constitutional: Positive for fever.  Respiratory: Positive for cough.        Objective:   Physical Exam  Ears normal throat normal sinus nontender neck no masses lungs clear no crackles heart regular      Assessment & Plan:  Hydrocodone cough medication caution drowsiness, best for home use only.  Viral syndrome should take a few days for run its course but then gradually get better  Patient has a tendency toward sinus infections if she has progressive sinus congestion pressure and discomfort to use the antibiotics. Call us if worse

## 2015-11-01 ENCOUNTER — Other Ambulatory Visit: Payer: Self-pay | Admitting: Family Medicine

## 2015-12-04 ENCOUNTER — Ambulatory Visit (INDEPENDENT_AMBULATORY_CARE_PROVIDER_SITE_OTHER): Payer: BC Managed Care – PPO | Admitting: Family Medicine

## 2015-12-04 ENCOUNTER — Encounter: Payer: Self-pay | Admitting: Family Medicine

## 2015-12-04 VITALS — BP 132/88 | Temp 99.1°F | Ht 68.0 in | Wt 196.0 lb

## 2015-12-04 DIAGNOSIS — J111 Influenza due to unidentified influenza virus with other respiratory manifestations: Secondary | ICD-10-CM

## 2015-12-04 MED ORDER — CITALOPRAM HYDROBROMIDE 20 MG PO TABS: 20.0000 mg | ORAL_TABLET | Freq: Every day | ORAL | Status: DC

## 2015-12-04 MED ORDER — HYDROCODONE-HOMATROPINE 5-1.5 MG/5ML PO SYRP: 5.0000 mL | ORAL_SOLUTION | Freq: Four times a day (QID) | ORAL | Status: DC | PRN

## 2015-12-04 MED ORDER — OSELTAMIVIR PHOSPHATE 75 MG PO CAPS: 75.0000 mg | ORAL_CAPSULE | Freq: Two times a day (BID) | ORAL | Status: DC

## 2015-12-04 NOTE — Patient Instructions (Signed)

## 2015-12-04 NOTE — Progress Notes (Signed)
   Subjective:    Patient ID: [name], female    DOB: 20-Feb-1961, 55 y.o.   MRN: [id number]  Cough This is a new problem. The current episode started yesterday. Associated symptoms include a fever, headaches, myalgias and a sore throat. Treatments tried: ibuprofen.   Patient started having congestion drainage body aches cough not feeling good Saturday night into Sunday with progressive symptoms Sunday morning insomnia body aches fever chills sweats denies nausea vomiting diarrhea patient not toxic warning signs were discussed   Review of Systems  Constitutional: Positive for fever.  HENT: Positive for sore throat.   Respiratory: Positive for cough.   Musculoskeletal: Positive for myalgias.  Neurological: Positive for headaches.       Objective:   Physical Exam  Constitutional: She appears well-developed.  HENT:  Head: Normocephalic.  Nose: Nose normal.  Mouth/Throat: Oropharynx is clear and moist. No oropharyngeal exudate.  Neck: Neck supple.  Cardiovascular: Normal rate and normal heart sounds.   No murmur heard. Pulmonary/Chest: Effort normal and breath sounds normal. She has no wheezes.  Lymphadenopathy:    She has no cervical adenopathy.  Skin: Skin is warm and dry.  Nursing note and vitals reviewed.         Assessment & Plan:  Influenza-the patient was diagnosed with influenza. Patient/family educated about the flu and warning signs to watch for. If difficulty breathing, severe neck pain and stiffness, cyanosis, disorientation, or progressive worsening then immediately get rechecked at that ER. If progressive symptoms be certain to be rechecked. Supportive measures such as Tylenol/ibuprofen was discussed. No aspirin use in children. And influenza home care instruction sheet was given.

## 2015-12-08 ENCOUNTER — Telehealth: Payer: Self-pay | Admitting: Family Medicine

## 2015-12-08 NOTE — Telephone Encounter (Signed)
[facility]   Pt would like to know what she can use for the cough during the day She does not want to try the pearls states it didn't work in the past Or what would you recommend for OTC with the current daily meds  She was concerned to take something that may cause a problem.   Please advise

## 2015-12-08 NOTE — Telephone Encounter (Signed)
I would use Delsym or Robitussin-DM

## 2015-12-08 NOTE — Telephone Encounter (Signed)
Notified patient [name] or [name]. Patient verbalized understanding.

## 2015-12-11 ENCOUNTER — Encounter: Payer: Self-pay | Admitting: Family Medicine

## 2015-12-11 ENCOUNTER — Ambulatory Visit (INDEPENDENT_AMBULATORY_CARE_PROVIDER_SITE_OTHER): Payer: BC Managed Care – PPO | Admitting: Family Medicine

## 2015-12-11 VITALS — Ht 68.0 in

## 2015-12-11 DIAGNOSIS — B9689 Other specified bacterial agents as the cause of diseases classified elsewhere: Secondary | ICD-10-CM

## 2015-12-11 DIAGNOSIS — J111 Influenza due to unidentified influenza virus with other respiratory manifestations: Secondary | ICD-10-CM | POA: Diagnosis not present

## 2015-12-11 DIAGNOSIS — J019 Acute sinusitis, unspecified: Secondary | ICD-10-CM | POA: Diagnosis not present

## 2015-12-11 MED ORDER — HYDROCODONE-HOMATROPINE 5-1.5 MG/5ML PO SYRP: 5.0000 mL | ORAL_SOLUTION | Freq: Four times a day (QID) | ORAL | Status: DC | PRN

## 2015-12-11 MED ORDER — CEFPROZIL 500 MG PO TABS: 500.0000 mg | ORAL_TABLET | Freq: Two times a day (BID) | ORAL | Status: DC

## 2015-12-11 NOTE — Progress Notes (Signed)
   Subjective:    Patient ID: [name], female    DOB: Apr 01, 1961, 55 y.o.   MRN: [id number]  Cough This is a new problem. The current episode started in the past 7 days. The problem has been unchanged. The cough is non-productive. Associated symptoms include rhinorrhea. Pertinent negatives include no chest pain, ear pain, fever, shortness of breath or wheezing. Associated symptoms comments: fatigue. Nothing aggravates the symptoms. She has tried OTC cough suppressant for the symptoms. The treatment provided no relief.     patient with flulike illness last week started get better but now with head congestion sinus pressure and also some chest coughing and congestion denies high fever chills sweats  Review of Systems  Constitutional: Negative for fever and activity change.  HENT: Positive for congestion and rhinorrhea. Negative for ear pain.   Eyes: Negative for discharge.  Respiratory: Positive for cough. Negative for shortness of breath and wheezing.   Cardiovascular: Negative for chest pain.       Objective:   Physical Exam  Constitutional: She appears well-developed.  HENT:  Head: Normocephalic.  Nose: Nose normal.  Mouth/Throat: Oropharynx is clear and moist. No oropharyngeal exudate.  Neck: Neck supple.  Cardiovascular: Normal rate and normal heart sounds.   No murmur heard. Pulmonary/Chest: Effort normal and breath sounds normal. She has no wheezes.  Lymphadenopathy:    She has no cervical adenopathy.  Skin: Skin is warm and dry.  Nursing note and vitals reviewed.         Assessment & Plan:   viral syndrome  flulike illness resolving Secondary rhinosinusitis Antibiotics prescribed warning signs discussed Mild bronchitis related to the above. No need for x-rays or lab work. Warning signs discussed.

## 2015-12-21 ENCOUNTER — Telehealth: Payer: Self-pay | Admitting: Family Medicine

## 2015-12-21 DIAGNOSIS — M79643 Pain in unspecified hand: Secondary | ICD-10-CM

## 2015-12-21 NOTE — Telephone Encounter (Signed)
Patient was seen 3/6 and stated she spoke with you about her left hand and her visit and now she wants a referral to orthopedic doctor as soon as possible.

## 2015-12-22 NOTE — Telephone Encounter (Signed)
Please assist patient with setting orthopedic referral in motion for hand pain. Find out for the patient if she would like to be scheduled with Dr. Aline Brochure up here or down in [place].

## 2015-12-25 ENCOUNTER — Encounter: Payer: Self-pay | Admitting: Family Medicine

## 2016-01-12 ENCOUNTER — Other Ambulatory Visit (HOSPITAL_COMMUNITY): Payer: Self-pay | Admitting: Orthopaedic Surgery

## 2016-01-12 DIAGNOSIS — M79642 Pain in left hand: Secondary | ICD-10-CM

## 2016-01-17 ENCOUNTER — Ambulatory Visit (HOSPITAL_COMMUNITY)
Admission: RE | Admit: 2016-01-17 | Discharge: 2016-01-17 | Disposition: A | Discharge status: Home / Self Care | Payer: BC Managed Care – PPO | Source: Ambulatory Visit | Attending: Orthopaedic Surgery | Admitting: Orthopaedic Surgery

## 2016-01-17 DIAGNOSIS — M79642 Pain in left hand: Secondary | ICD-10-CM | POA: Diagnosis present

## 2016-01-19 ENCOUNTER — Telehealth: Payer: Self-pay | Admitting: Family Medicine

## 2016-01-19 MED ORDER — HYDROCODONE-ACETAMINOPHEN 5-325 MG PO TABS: 1.0000 | ORAL_TABLET | ORAL | Status: DC | PRN

## 2016-01-19 NOTE — Telephone Encounter (Signed)
hydrocod 5/325 thirty one q four to six prn pain

## 2016-01-19 NOTE — Telephone Encounter (Signed)
Rx printed and given to patient.

## 2016-01-19 NOTE — Telephone Encounter (Signed)
Pt was issued an inj. At the orthopedist yesterday in her hand She felt fine initially, but as the day went on she was in a great Deal of pain, bring tears to your eyes kind of pain. She took  800mg  of Ibuprofen but it is not touching the pain. Their office  Is closed today or she would be calling them. She states she  Would like something to help with the pain to get through the weekend Till she can contact them on Monday.    [facility]

## 2016-02-26 ENCOUNTER — Other Ambulatory Visit: Payer: Self-pay

## 2016-02-26 DIAGNOSIS — Z1231 Encounter for screening mammogram for malignant neoplasm of breast: Secondary | ICD-10-CM

## 2016-03-13 ENCOUNTER — Ambulatory Visit: Payer: BC Managed Care – PPO

## 2016-03-18 ENCOUNTER — Ambulatory Visit
Admission: RE | Admit: 2016-03-18 | Discharge: 2016-03-18 | Disposition: A | Discharge status: Home / Self Care | Payer: BC Managed Care – PPO | Source: Ambulatory Visit

## 2016-03-18 DIAGNOSIS — Z1231 Encounter for screening mammogram for malignant neoplasm of breast: Secondary | ICD-10-CM

## 2016-03-19 ENCOUNTER — Other Ambulatory Visit: Payer: Self-pay | Admitting: Family Medicine

## 2016-03-20 ENCOUNTER — Other Ambulatory Visit: Payer: Self-pay | Admitting: Gynecology

## 2016-03-20 DIAGNOSIS — R928 Other abnormal and inconclusive findings on diagnostic imaging of breast: Secondary | ICD-10-CM

## 2016-03-27 ENCOUNTER — Ambulatory Visit
Admission: RE | Admit: 2016-03-27 | Discharge: 2016-03-27 | Disposition: A | Discharge status: Home / Self Care | Payer: BC Managed Care – PPO | Source: Ambulatory Visit | Attending: Gynecology | Admitting: Gynecology

## 2016-03-27 DIAGNOSIS — R928 Other abnormal and inconclusive findings on diagnostic imaging of breast: Secondary | ICD-10-CM

## 2016-05-31 ENCOUNTER — Other Ambulatory Visit: Payer: Self-pay | Admitting: Family Medicine

## 2016-05-31 NOTE — Telephone Encounter (Signed)
May have this and 2 refills needs office visit this fall

## 2016-07-11 ENCOUNTER — Ambulatory Visit (INDEPENDENT_AMBULATORY_CARE_PROVIDER_SITE_OTHER): Payer: BC Managed Care – PPO | Admitting: Nurse Practitioner

## 2016-07-11 ENCOUNTER — Encounter: Payer: Self-pay | Admitting: Nurse Practitioner

## 2016-07-11 VITALS — BP 122/80 | Ht 68.0 in | Wt 196.4 lb

## 2016-07-11 DIAGNOSIS — Z23 Encounter for immunization: Secondary | ICD-10-CM | POA: Diagnosis not present

## 2016-07-11 DIAGNOSIS — L608 Other nail disorders: Secondary | ICD-10-CM | POA: Diagnosis not present

## 2016-07-11 DIAGNOSIS — M779 Enthesopathy, unspecified: Secondary | ICD-10-CM

## 2016-07-11 DIAGNOSIS — M778 Other enthesopathies, not elsewhere classified: Secondary | ICD-10-CM

## 2016-07-11 DIAGNOSIS — L509 Urticaria, unspecified: Secondary | ICD-10-CM | POA: Diagnosis not present

## 2016-07-11 MED ORDER — PREDNISONE 20 MG PO TABS
ORAL_TABLET | ORAL | 0 refills | Status: DC
Start: 2016-07-11 — End: 2016-07-25

## 2016-07-11 NOTE — Patient Instructions (Addendum)
Tea tree oil Loratadine 10 mg every morning Benadryl 25 mg at night Pepcid once a day

## 2016-07-11 NOTE — Progress Notes (Signed)
Subjective:  Presents for several issues. Having some tenderness in the base of the left thumb. Had a hyperextension injury a couple months ago. Saw orthopedic specialist for injection which greatly helped up until about a week ago. Has started wearing her hand brace again. Also having some discoloration along her left great toe around the nail. Was a dark blue color at one point, does not remember any trauma. Has since started to fade. Nontender. Also having migratory areas of itching and hives. This is been going on for about a week. Denies any excessive stress. No known allergies but has changed her laundry detergent. No difficulty breathing or swallowing. No wheezing. Has not identified any specific triggers. Unassociated with any particular foods such as beef.  Objective:   BP 122/80   Ht 5\' 8"  (1.727 m)   Wt 196 lb 6 oz (89.1 kg)   BMI 29.86 kg/m  NAD. Alert, oriented. Lungs clear. Heart regular rate rhythm. TMs normal limit. Pharynx clear. Neck supple with minimal adenopathy. Mild excoriation noted on the antecubital area left arm, otherwise skin is clear. Mild tenderness noted at the base of the left thumb, excellent hand strength and normal movement of the thumb. There is a faint area of discoloration on the medial aspect of the left great toe nail, nontender to palpation. No drainage erythema or edema. Minimal separation from the nailbed.   Assessment: Urticaria  Need for influenza vaccination - Plan: Flu Vaccine QUAD 36+ mos IM  Thumb tendonitis  Toenail deformity   Plan:  Meds ordered this encounter  Medications  . predniSONE (DELTASONE) 20 MG tablet    Sig: 3 po qd x 3 d then 2 po qd x 3 d then 1 po qd x 3 d    Dispense:  18 tablet    Refill:  0    Order Specific Question:   Supervising Provider    Answer:   Mikey Kirschner [[id number]]   Tea tree oil to affected nail Loratadine 10 mg every morning Benadryl 25 mg at night Pepcid once a day Start prednisone taper if needed  for severe symptoms. Callback if any symptoms worsen or persist. Recommend follow-up with orthopedic specialist if thumb pain continues.

## 2016-07-15 ENCOUNTER — Telehealth: Payer: Self-pay | Admitting: Family Medicine

## 2016-07-15 NOTE — Telephone Encounter (Signed)
If it is swelling and itching then The prednisone should help but if she is having any symptoms of infection such as tenderness or pain or fever she ought to consider getting checked here

## 2016-07-15 NOTE — Telephone Encounter (Signed)
Patient was prescribed predniSONE (DELTASONE) 20 MG tablet for some itching she was having on 07/11/16.  She did not get this filled because she thought she was getting better.  Now her whole face is itching and the whole right side of face is now swollen.  She wants to know if the prednisone will help this issue as well and if she needs to do anything else?

## 2016-07-15 NOTE — Telephone Encounter (Signed)
Notified patient if it is swelling and itching then the prednisone should help but if she is having any symptoms of infection such as tenderness or pain or fever she ought to consider getting checked here. Patient verbalized understanding. She started taking the prednisone.

## 2016-07-25 ENCOUNTER — Ambulatory Visit (INDEPENDENT_AMBULATORY_CARE_PROVIDER_SITE_OTHER): Payer: BC Managed Care – PPO | Admitting: Nurse Practitioner

## 2016-07-25 ENCOUNTER — Encounter: Payer: Self-pay | Admitting: Nurse Practitioner

## 2016-07-25 VITALS — BP 128/86 | Ht 68.0 in | Wt 195.0 lb

## 2016-07-25 DIAGNOSIS — R5383 Other fatigue: Secondary | ICD-10-CM | POA: Diagnosis not present

## 2016-07-25 MED ORDER — PHENTERMINE HCL 37.5 MG PO TABS: 37.5000 mg | ORAL_TABLET | Freq: Every day | ORAL | 0 refills | Status: DC

## 2016-07-25 NOTE — Progress Notes (Signed)
Subjective:  Presents for routine follow-up. Doing well on Celexa but still experiencing some fatigue. Struggling with trying to lose weight. Did take her course of prednisone previously prescribed, identified trigger as a particular detergent, since she has stopped using this rash has resolved. Would like to try medication to help with weight loss. No regular exercise but tries to be as active as she can with her job which requires a lot of sitting.  Objective:   BP 128/86   Ht 5\' 8"  (1.727 m)   Wt 195 lb (88.5 kg)   BMI 29.65 kg/m  NAD. Alert, oriented. Lungs clear. Heart regular rate rhythm. No murmur or gallop noted.  Assessment:  Problem List Items Addressed This Visit      Other   Morbid obesity ([place])   Relevant Medications   phentermine (ADIPEX-P) 37.5 MG tablet    Other Visit Diagnoses    Other fatigue    -  Primary     Plan:  Meds ordered this encounter  Medications  . phentermine (ADIPEX-P) 37.5 MG tablet    Sig: Take 1 tablet (37.5 mg total) by mouth daily before breakfast.    Dispense:  30 tablet    Refill:  0    Order Specific Question:   Supervising Provider    Answer:   Maggie Font   Reviewed potential adverse effects phentermine. DC med and call if any problems. Encouraged healthy diet and regular exercise. Return in about 1 month (around 08/25/2016) for recheck. Sooner if any problems.

## 2016-08-22 ENCOUNTER — Ambulatory Visit (INDEPENDENT_AMBULATORY_CARE_PROVIDER_SITE_OTHER): Payer: BC Managed Care – PPO | Admitting: Nurse Practitioner

## 2016-08-22 ENCOUNTER — Encounter: Payer: Self-pay | Admitting: Nurse Practitioner

## 2016-08-22 VITALS — BP 128/84 | Ht 68.0 in | Wt 185.0 lb

## 2016-08-22 DIAGNOSIS — R5383 Other fatigue: Secondary | ICD-10-CM

## 2016-08-22 MED ORDER — CITALOPRAM HYDROBROMIDE 20 MG PO TABS
20.0000 mg | ORAL_TABLET | Freq: Every day | ORAL | 5 refills | Status: DC
Start: 2016-08-22 — End: 2016-10-08

## 2016-08-22 MED ORDER — PHENTERMINE HCL 37.5 MG PO TABS: 37.5000 mg | ORAL_TABLET | Freq: Every day | ORAL | 2 refills | Status: DC

## 2016-08-22 NOTE — Progress Notes (Signed)
Subjective:  Presents for routine follow-up. Doing well on phentermine. Denies any adverse effects. No change in sleep pattern. No chest pain/shortness of breath or palpitations. Has increased her activity. Doing well with her diet. Energy level much improved. Also stable on Celexa.  Objective:   BP 128/84   Ht 5\' 8"  (1.727 m)   Wt 185 lb (83.9 kg)   BMI 28.13 kg/m  NAD. Alert, oriented. Lungs clear. Heart regular rate rhythm. Has lost 10 pounds since her last visit.  Assessment:  Problem List Items Addressed This Visit      Other   Morbid obesity ([place])   Relevant Medications   phentermine (ADIPEX-P) 37.5 MG tablet    Other Visit Diagnoses    Other fatigue    -  Primary     Plan:  Meds ordered this encounter  Medications  . citalopram (CELEXA) 20 MG tablet    Sig: Take 1 tablet (20 mg total) by mouth daily.    Dispense:  30 tablet    Refill:  5    Order Specific Question:   Supervising Provider    Answer:   Mikey Kirschner [[id number]]  . phentermine (ADIPEX-P) 37.5 MG tablet    Sig: Take 1 tablet (37.5 mg total) by mouth daily before breakfast.    Dispense:  30 tablet    Refill:  2    Order Specific Question:   Supervising Provider    Answer:   Mikey Kirschner [[id number]]   Continue phentermine. Continue activity and healthy diet. Return in about 3 months (around 11/22/2016) for recheck.

## 2016-09-11 ENCOUNTER — Other Ambulatory Visit: Payer: Self-pay | Admitting: Family Medicine

## 2016-09-11 NOTE — Telephone Encounter (Signed)
May have this +5 refills 

## 2016-10-08 ENCOUNTER — Ambulatory Visit (INDEPENDENT_AMBULATORY_CARE_PROVIDER_SITE_OTHER): Payer: BC Managed Care – PPO | Admitting: Family Medicine

## 2016-10-08 ENCOUNTER — Telehealth: Payer: Self-pay | Admitting: Family Medicine

## 2016-10-08 ENCOUNTER — Encounter: Payer: Self-pay | Admitting: Family Medicine

## 2016-10-08 VITALS — BP 136/88 | Temp 98.2°F | Ht 68.0 in | Wt 179.0 lb

## 2016-10-08 DIAGNOSIS — B349 Viral infection, unspecified: Secondary | ICD-10-CM | POA: Diagnosis not present

## 2016-10-08 DIAGNOSIS — J209 Acute bronchitis, unspecified: Secondary | ICD-10-CM | POA: Diagnosis not present

## 2016-10-08 DIAGNOSIS — J019 Acute sinusitis, unspecified: Secondary | ICD-10-CM

## 2016-10-08 DIAGNOSIS — B9689 Other specified bacterial agents as the cause of diseases classified elsewhere: Secondary | ICD-10-CM

## 2016-10-08 MED ORDER — HYDROCODONE-HOMATROPINE 5-1.5 MG/5ML PO SYRP: 5.0000 mL | ORAL_SOLUTION | Freq: Four times a day (QID) | ORAL | 0 refills | Status: DC | PRN

## 2016-10-08 MED ORDER — AMOXICILLIN-POT CLAVULANATE 875-125 MG PO TABS: 1.0000 | ORAL_TABLET | Freq: Two times a day (BID) | ORAL | 0 refills | Status: DC

## 2016-10-08 NOTE — Telephone Encounter (Signed)
Notified patient it is perfectly fine to take the Mucinex and ordered try to help thin the mucus. The antibiotic will take several days to help but she should see improvement as a week goes on. Patient verbalized understanding.

## 2016-10-08 NOTE — Patient Instructions (Signed)
DASH Eating Plan DASH stands for "Dietary Approaches to Stop Hypertension." The DASH eating plan is a healthy eating plan that has been shown to reduce high blood pressure (hypertension). Additional health benefits may include reducing the risk of type 2 diabetes mellitus, heart disease, and stroke. The DASH eating plan may also help with weight loss. What do I need to know about the DASH eating plan? For the DASH eating plan, you will follow these general guidelines:  Choose foods with less than 150 milligrams of sodium per serving (as listed on the food label).  Use salt-free seasonings or herbs instead of table salt or sea salt.  Check with your health care provider or pharmacist before using salt substitutes.  Eat lower-sodium products. These are often labeled as "low-sodium" or "no salt added."  Eat fresh foods. Avoid eating a lot of canned foods.  Eat more vegetables, fruits, and low-fat dairy products.  Choose whole grains. Look for the word "whole" as the first word in the ingredient list.  Choose fish and skinless chicken or turkey more often than red meat. Limit fish, poultry, and meat to 6 oz (170 g) each day.  Limit sweets, desserts, sugars, and sugary drinks.  Choose heart-healthy fats.  Eat more home-cooked food and less restaurant, buffet, and fast food.  Limit fried foods.  Do not fry foods. Cook foods using methods such as baking, boiling, grilling, and broiling instead.  When eating at a restaurant, ask that your food be prepared with less salt, or no salt if possible. What foods can I eat? Seek help from a dietitian for individual calorie needs. Grains  Whole grain or whole wheat bread. Brown rice. Whole grain or whole wheat pasta. Quinoa, bulgur, and whole grain cereals. Low-sodium cereals. Corn or whole wheat flour tortillas. Whole grain cornbread. Whole grain crackers. Low-sodium crackers. Vegetables  Fresh or frozen vegetables (raw, steamed, roasted, or  grilled). Low-sodium or reduced-sodium tomato and vegetable juices. Low-sodium or reduced-sodium tomato sauce and paste. Low-sodium or reduced-sodium canned vegetables. Fruits  All fresh, canned (in natural juice), or frozen fruits. Meat and Other Protein Products  Ground beef (85% or leaner), grass-fed beef, or beef trimmed of fat. Skinless chicken or turkey. Ground chicken or turkey. Pork trimmed of fat. All fish and seafood. Eggs. Dried beans, peas, or lentils. Unsalted nuts and seeds. Unsalted canned beans. Dairy  Low-fat dairy products, such as skim or 1% milk, 2% or reduced-fat cheeses, low-fat ricotta or cottage cheese, or plain low-fat yogurt. Low-sodium or reduced-sodium cheeses. Fats and Oils  Tub margarines without trans fats. Light or reduced-fat mayonnaise and salad dressings (reduced sodium). Avocado. Safflower, olive, or canola oils. Natural peanut or almond butter. Other  Unsalted popcorn and pretzels. The items listed above may not be a complete list of recommended foods or beverages. Contact your dietitian for more options.  What foods are not recommended? Grains  White bread. White pasta. White rice. Refined cornbread. Bagels and croissants. Crackers that contain trans fat. Vegetables  Creamed or fried vegetables. Vegetables in a cheese sauce. Regular canned vegetables. Regular canned tomato sauce and paste. Regular tomato and vegetable juices. Fruits  Canned fruit in light or heavy syrup. Fruit juice. Meat and Other Protein Products  Fatty cuts of meat. Ribs, chicken wings, bacon, sausage, bologna, salami, chitterlings, fatback, hot dogs, bratwurst, and packaged luncheon meats. Salted nuts and seeds. Canned beans with salt. Dairy  Whole or 2% milk, cream, half-and-half, and cream cheese. Whole-fat or sweetened yogurt. Full-fat cheeses   or blue cheese. Nondairy creamers and whipped toppings. Processed cheese, cheese spreads, or cheese curds. Condiments  Onion and garlic  salt, seasoned salt, table salt, and sea salt. Canned and packaged gravies. Worcestershire sauce. Tartar sauce. Barbecue sauce. Teriyaki sauce. Soy sauce, including reduced sodium. Steak sauce. Fish sauce. Oyster sauce. Cocktail sauce. Horseradish. Ketchup and mustard. Meat flavorings and tenderizers. Bouillon cubes. Hot sauce. Tabasco sauce. Marinades. Taco seasonings. Relishes. Fats and Oils  Butter, stick margarine, lard, shortening, ghee, and bacon fat. Coconut, palm kernel, or palm oils. Regular salad dressings. Other  Pickles and olives. Salted popcorn and pretzels. The items listed above may not be a complete list of foods and beverages to avoid. Contact your dietitian for more information.  Where can I find more information? National Heart, Lung, and Blood Institute: www.nhlbi.nih.gov/health/health-topics/topics/dash/ This information is not intended to replace advice given to you by your health care provider. Make sure you discuss any questions you have with your health care provider. Document Released: 09/12/2011 Document Revised: 02/29/2016 Document Reviewed: 07/28/2013 Elsevier Interactive Patient Education  2017 [employer]vier Inc.  

## 2016-10-08 NOTE — Progress Notes (Signed)
   Subjective:    Patient ID: [name], female    DOB: 10-12-1960, 56 y.o.   MRN: [id number]  Sinusitis  This is a new problem. The current episode started in the past 7 days. Associated symptoms include congestion, coughing and headaches. Pertinent negatives include no ear pain or shortness of breath. (Wheezing) Treatments tried: mucinex.   Patient started off a week and half ago now with head congestion drainage coughing frequent coughing chest congestion as well denies wheezing or difficulty breathing  Patient's blood pressure borderline healthy diet exercise recommended Review of Systems  Constitutional: Negative for activity change and fever.  HENT: Positive for congestion and rhinorrhea. Negative for ear pain.   Eyes: Negative for discharge.  Respiratory: Positive for cough. Negative for shortness of breath and wheezing.   Cardiovascular: Negative for chest pain.  Neurological: Positive for headaches.       Objective:   Physical Exam  Constitutional: She appears well-developed.  HENT:  Head: Normocephalic.  Nose: Nose normal.  Mouth/Throat: Oropharynx is clear and moist. No oropharyngeal exudate.  Neck: Neck supple.  Cardiovascular: Normal rate and normal heart sounds.   No murmur heard. Pulmonary/Chest: Effort normal and breath sounds normal. She has no wheezes.  Lymphadenopathy:    She has no cervical adenopathy.  Skin: Skin is warm and dry.  Nursing note and vitals reviewed.   Patient with viral like illness over the past week with sinus pressure pain discomfort coughing and bronchial component over the past 3-4 days      Assessment & Plan:  Patient was seen today for upper respiratory illness. It is felt that the patient is dealing with sinusitis. Antibiotics were prescribed today. Importance of compliance with medication was discussed. Symptoms should gradually resolve over the course of the next several days. If high fevers, progressive illness, difficulty  breathing, worsening condition or failure for symptoms to improve over the next several days then the patient is to follow-up. If any emergent conditions the patient is to follow-up in the emergency department otherwise to follow-up in the office.

## 2016-10-08 NOTE — Telephone Encounter (Signed)
Patient was seen today by Dr. Nicki Reaper for viral syndrome. She wants to know if she should continue taking the mucinex along with the antibiotic?

## 2016-10-08 NOTE — Telephone Encounter (Signed)
It is perfectly fine to take the Mucinex and ordered try to help thin the mucus. The antibiotic will take several days to help but she should see improvement as a week goes on

## 2016-10-14 ENCOUNTER — Other Ambulatory Visit: Payer: Self-pay | Admitting: *Deleted

## 2016-10-14 ENCOUNTER — Telehealth: Payer: Self-pay | Admitting: Family Medicine

## 2016-10-14 MED ORDER — HYDROCODONE-HOMATROPINE 5-1.5 MG/5ML PO SYRP: 5.0000 mL | ORAL_SOLUTION | Freq: Four times a day (QID) | ORAL | 0 refills | Status: DC | PRN

## 2016-10-14 NOTE — Telephone Encounter (Signed)
May refill this for 3 ounces only-home use only

## 2016-10-14 NOTE — Telephone Encounter (Signed)
Script ready for pickup. Pt notified.  

## 2016-10-14 NOTE — Telephone Encounter (Signed)
Seen 1/2

## 2016-10-14 NOTE — Telephone Encounter (Signed)
Patient is requesting refill on hycodan 5.1

## 2016-10-17 ENCOUNTER — Ambulatory Visit (INDEPENDENT_AMBULATORY_CARE_PROVIDER_SITE_OTHER): Payer: BC Managed Care – PPO | Admitting: Family Medicine

## 2016-10-17 ENCOUNTER — Encounter: Payer: Self-pay | Admitting: Family Medicine

## 2016-10-17 VITALS — BP 122/80 | Temp 98.0°F | Ht 68.0 in | Wt 182.0 lb

## 2016-10-17 DIAGNOSIS — J019 Acute sinusitis, unspecified: Secondary | ICD-10-CM | POA: Diagnosis not present

## 2016-10-17 DIAGNOSIS — B9689 Other specified bacterial agents as the cause of diseases classified elsewhere: Secondary | ICD-10-CM | POA: Diagnosis not present

## 2016-10-17 MED ORDER — CEFPROZIL 500 MG PO TABS
500.0000 mg | ORAL_TABLET | Freq: Two times a day (BID) | ORAL | 0 refills | Status: DC
Start: 2016-10-17 — End: 2017-03-07

## 2016-10-17 NOTE — Progress Notes (Signed)
   Subjective:    Patient ID: [name], female    DOB: Jul 21, 1961, 56 y.o.   MRN: [id number]  Cough  This is a new problem. The current episode started in the past 7 days. The problem has been unchanged. The cough is non-productive. Nothing aggravates the symptoms. Treatments tried: antibiotic. The treatment provided no relief.   Patient has no other concerns at this time.  Patient denies high fever chills sweats relates a lot of head congestion drainage sinus pressure not getting better since taking the antibiotic  Review of Systems  Respiratory: Positive for cough.    Cough sinus congestion denies high fever chills sweats nausea vomiting diarrhea    Objective:   Physical Exam Eardrums normal nares normal subjective discomfort in the sinuses lungs clear heart regular  Labs and x-rays not indicated patient not toxic     Assessment & Plan:  Viral syndrome Secondary rhinosinusitis Continue antibiotics Follow-up if progressive troubles

## 2016-10-27 ENCOUNTER — Emergency Department (HOSPITAL_COMMUNITY)
Admission: EM | Admit: 2016-10-27 | Discharge: 2016-10-27 | Disposition: A | Discharge status: Home / Self Care | Payer: BC Managed Care – PPO | Attending: Emergency Medicine | Admitting: Emergency Medicine

## 2016-10-27 ENCOUNTER — Emergency Department (HOSPITAL_COMMUNITY): Payer: BC Managed Care – PPO

## 2016-10-27 ENCOUNTER — Encounter (HOSPITAL_COMMUNITY): Payer: Self-pay | Admitting: Cardiology

## 2016-10-27 DIAGNOSIS — Y92481 Parking lot as the place of occurrence of the external cause: Secondary | ICD-10-CM | POA: Diagnosis not present

## 2016-10-27 DIAGNOSIS — Y999 Unspecified external cause status: Secondary | ICD-10-CM | POA: Insufficient documentation

## 2016-10-27 DIAGNOSIS — S161XXA Strain of muscle, fascia and tendon at neck level, initial encounter: Secondary | ICD-10-CM | POA: Insufficient documentation

## 2016-10-27 DIAGNOSIS — W010XXA Fall on same level from slipping, tripping and stumbling without subsequent striking against object, initial encounter: Secondary | ICD-10-CM | POA: Insufficient documentation

## 2016-10-27 DIAGNOSIS — Y939 Activity, unspecified: Secondary | ICD-10-CM | POA: Diagnosis not present

## 2016-10-27 DIAGNOSIS — S7002XA Contusion of left hip, initial encounter: Secondary | ICD-10-CM | POA: Insufficient documentation

## 2016-10-27 DIAGNOSIS — S199XXA Unspecified injury of neck, initial encounter: Secondary | ICD-10-CM | POA: Diagnosis present

## 2016-10-27 MED ORDER — HYDROCODONE-ACETAMINOPHEN 5-325 MG PO TABS: ORAL_TABLET | ORAL | 0 refills | Status: DC

## 2016-10-27 MED ORDER — METHOCARBAMOL 500 MG PO TABS: 500.0000 mg | ORAL_TABLET | Freq: Three times a day (TID) | ORAL | 0 refills | Status: DC

## 2016-10-27 MED ORDER — NAPROXEN 500 MG PO TABS: 500.0000 mg | ORAL_TABLET | Freq: Two times a day (BID) | ORAL | 0 refills | Status: DC

## 2016-10-27 NOTE — Discharge Instructions (Signed)
Apply ice packs on/off to your hip and neck.  Follow-up with your doctor for recheck if needed.

## 2016-10-27 NOTE — ED Triage Notes (Signed)
Fall this morning around 830  while visiting father in the [place] center.   Fell in parking lot on ice.   C/o left shoulder,  Left hip  and left lower leg  Pain.

## 2016-10-27 NOTE — ED Notes (Signed)
Pt taken to xray 

## 2016-10-27 NOTE — ED Provider Notes (Signed)
[facility] DEPT Provider Note   CSN: [id number] Arrival date & time: 10/27/16  1511  By signing my name below, I, [name], attest that this documentation has been prepared under the direction and in the presence of Raney Antwine PA-C. Electronically Signed: Collene Hardin, Scribe. 10/27/16. 4:30 PM.    History   Chief Complaint Chief Complaint  Patient presents with  . Fall    HPI Comments: [name] is a 56 y.o. female with a hx of neck surgery, who presents to the Emergency Department complaining of a sudden-onset mechanical fall that happened earlier today. Patient states she slipped on ice and fell in the parking lot while visiting her father at [facility]. Patient states she fell forward on her hands and knees. Patient has associated upper back soreness,  left-sided neck, shoulder, hip, and calf pain. Patient reports taking 2 ibuprofen about 5 hours prior with minimal improvement. Patient denies hitting her head, loss of consciousness, headache, dizziness, vomiting, and knee pain.   The history is provided by the patient. No language interpreter was used.    Past Medical History:  Diagnosis Date  . Aneurysm ([place])   . Normal spontaneous vaginal delivery    2    Patient Active Problem List   Diagnosis Date Noted  . Morbid obesity ([place]) 07/25/2016  . 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 Gall stones 03/17/2014  . Esophageal reflux 07/31/2013  . Chest wall mass, left 04/13/2013  . Left breast mass 01/20/2013  . COUGH 02/07/2009    Past Surgical History:  Procedure Laterality Date  . ABDOMINAL HYSTERECTOMY  2011  . COLONOSCOPY N/A 06/09/2014   Procedure: COLONOSCOPY;  Surgeon: Rogene Houston, MD;  Location: [facility];  Service: Endoscopy;  Laterality: N/A;  830-moved to [address] to notify pt  . LAPAROSCOPIC TOTAL HYSTERECTOMY  07/18/10   RSO  . NECK SURGERY  11/2012   c6 c7    OB History    No data available       Home Medications     Prior to Admission medications   Medication Sig Start Date End Date Taking? Authorizing Provider  cefPROZIL (CEFZIL) 500 MG tablet Take 1 tablet (500 mg total) by mouth 2 (two) times daily. 10/17/16   Kathyrn Drown, MD  citalopram (CELEXA) 20 MG tablet TAKE 1 TABLET(20 MG) BY MOUTH DAILY 09/11/16   Kathyrn Drown, MD  HYDROcodone-homatropine (HYCODAN) 5-1.5 MG/5ML syrup Take 5 mLs by mouth every 6 (six) hours as needed for cough. Home use only 10/14/16   Kathyrn Drown, MD  phentermine (ADIPEX-P) 37.5 MG tablet Take 1 tablet (37.5 mg total) by mouth daily before breakfast. 08/22/16   Nilda Simmer, NP    Family History Family History  Problem Relation Age of Onset  . Heart disease Mother   . Colon cancer Neg Hx     Social History Social History  Substance Use Topics  . Smoking status: Never Smoker  . Smokeless tobacco: Never Used  . Alcohol use Yes     Comment: 2-3 times/month     Allergies   Patient has no known allergies.   Review of Systems Review of Systems  Constitutional: Negative for chills and fever.  Eyes: Negative for visual disturbance.  Respiratory: Negative for shortness of breath.   Cardiovascular: Negative for chest pain.  Gastrointestinal: Negative for abdominal pain, nausea and vomiting.  Genitourinary: Negative for difficulty urinating and dysuria.  Musculoskeletal: Positive for arthralgias, back pain, joint  swelling and neck pain.  Skin: Negative for color change and wound.  Neurological: Negative for dizziness, weakness, numbness and headaches.  All other systems reviewed and are negative.    Physical Exam Updated Vital Signs BP 102/73 (BP Location: Left Arm)   Pulse 92   Temp 98 F (36.7 C) (Oral)   Resp 18   Ht 5\' 8"  (1.727 m)   Wt 182 lb (82.6 kg)   SpO2 99%   BMI 27.67 kg/m   Physical Exam  Constitutional: She is oriented to person, place, and time. She appears well-developed and well-nourished. No distress.  HENT:  Head:  Normocephalic and atraumatic.  Mouth/Throat: Oropharynx is clear and moist.  Eyes: Conjunctivae and EOM are normal. Pupils are equal, round, and reactive to light.  Neck: Normal range of motion. Neck supple. No tracheal deviation present.  Cardiovascular: Normal rate, regular rhythm and intact distal pulses.   Pulmonary/Chest: Effort normal. No respiratory distress. She exhibits no tenderness.  Abdominal: Soft. There is no tenderness. There is no guarding.  Musculoskeletal: Normal range of motion. She exhibits tenderness. She exhibits no edema or deformity.   left-sided cervical paraspinal muscle tenderness. Left trapezius muscle tenderness. Normal ROM of the neck. TTP in the left SI joint. 5/5 strength against resistance bilateral lower extremities.  Mild tenderness to the left lateral calf without edema or bruising. No posterior tenderness.  Neurological: She is alert and oriented to person, place, and time. Coordination normal.  Skin: Skin is warm and dry.  Psychiatric: She has a normal mood and affect.  Nursing note and vitals reviewed.    ED Treatments / Results  DIAGNOSTIC STUDIES: Oxygen Saturation is 99% on RA, normal by my interpretation.    COORDINATION OF CARE: 4:28 PM Discussed treatment plan with pt at bedside and pt agreed to plan.  Labs (all labs ordered are listed, but only abnormal results are displayed) Labs Reviewed - No data to display  EKG  EKG Interpretation None       Radiology Ct Cervical Spine Wo Contrast  Result Date: 10/27/2016 CLINICAL DATA:  Fall on ice today. Left-sided neck pain. Previous cervical spine fusion. Initial encounter. EXAM: CT CERVICAL SPINE WITHOUT CONTRAST TECHNIQUE: Multidetector CT imaging of the cervical spine was performed without intravenous contrast. Multiplanar CT image reconstructions were also generated. COMPARISON:  None. FINDINGS: Alignment: Normal. Skull base and vertebrae: No acute fracture. No primary bone lesion or  focal pathologic process. Soft tissues and spinal canal: No prevertebral fluid or swelling. No visible canal hematoma. Disc levels: Previous ACDF at C6-7. Other disc spaces are maintained. Upper chest: Negative. Other: None. IMPRESSION: No evidence of cervical spine fracture or subluxation. Prior ACDF at C6-7. Electronically Signed   By: Earle Gell M.D.   On: 10/27/2016 18:20   Dg Hip Unilat W Or Wo Pelvis 2-3 Views Left  Result Date: 10/27/2016 CLINICAL DATA:  Status post fall, right hip pain EXAM: DG HIP (WITH OR WITHOUT PELVIS) 2-3V LEFT COMPARISON:  None. FINDINGS: There is no evidence of hip fracture or dislocation. There is no evidence of arthropathy or other focal bone abnormality. IMPRESSION: No acute osseous injury of the right hip. Electronically Signed   By: Kathreen Devoid   On: 10/27/2016 17:49    Procedures Procedures (including critical care time)  Medications Ordered in ED Medications - No data to display   Initial Impression / Assessment and Plan / ED Course  I have reviewed the triage vital signs and the nursing notes.  Pertinent labs & imaging results that were available during my care of the patient were reviewed by me and considered in my medical decision making (see chart for details).     Pt well appearing.  Vitals stable.  Left sided pain after mechanical fall.  Imaging negative for fx's.  NV intact.  ambulates with steady gait.  No focal neuro deficits.  Pt agrees to symptomatic tx and close PMD f/u.    Final Clinical Impressions(s) / ED Diagnoses   Final diagnoses:  Cervical strain, acute, initial encounter  Contusion of left hip, initial encounter    New Prescriptions Discharge Medication List as of 10/27/2016  6:47 PM    START taking these medications   Details  HYDROcodone-acetaminophen (NORCO/VICODIN) 5-325 MG tablet Take one tab po q 4-6 hrs prn pain, Print    methocarbamol (ROBAXIN) 500 MG tablet Take 1 tablet (500 mg total) by mouth 3 (three) times  daily., Starting Sun 10/27/2016, Print    naproxen (NAPROSYN) 500 MG tablet Take 1 tablet (500 mg total) by mouth 2 (two) times daily with a meal., Starting Sun 10/27/2016, Print       I personally performed the services described in this documentation, which was scribed in my presence. The recorded information has been reviewed and is accurate.    Kem Parkinson, PA-C 10/29/16 [place], MD 10/30/16 1124

## 2017-02-19 ENCOUNTER — Encounter: Payer: Self-pay | Admitting: Gynecology

## 2017-03-07 ENCOUNTER — Ambulatory Visit (INDEPENDENT_AMBULATORY_CARE_PROVIDER_SITE_OTHER): Payer: BC Managed Care – PPO | Admitting: Nurse Practitioner

## 2017-03-07 ENCOUNTER — Telehealth: Payer: Self-pay | Admitting: Family Medicine

## 2017-03-07 ENCOUNTER — Encounter: Payer: Self-pay | Admitting: Nurse Practitioner

## 2017-03-07 VITALS — BP 124/84 | Temp 98.8°F | Ht 68.0 in | Wt 184.0 lb

## 2017-03-07 DIAGNOSIS — J069 Acute upper respiratory infection, unspecified: Secondary | ICD-10-CM | POA: Diagnosis not present

## 2017-03-07 MED ORDER — AMOXICILLIN-POT CLAVULANATE 875-125 MG PO TABS: 1.0000 | ORAL_TABLET | Freq: Two times a day (BID) | ORAL | 0 refills | Status: DC

## 2017-03-07 MED ORDER — PHENTERMINE HCL 37.5 MG PO TABS: 37.5000 mg | ORAL_TABLET | Freq: Every day | ORAL | 2 refills | Status: DC

## 2017-03-07 MED ORDER — SCOPOLAMINE 1 MG/3DAYS TD PT72: 1.0000 | MEDICATED_PATCH | TRANSDERMAL | 0 refills | Status: DC
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
## 2017-03-07 NOTE — Telephone Encounter (Signed)
The oral form of this drug is no longer available. Two options: see if another pharmacy has it in stock (may still have some available) OR switch to OTC Meclizine as directed; comes in different brands

## 2017-03-07 NOTE — Progress Notes (Signed)
Subjective:  Presents for complaints of sore throat and fatigue for the past 2 days. Irritated throat. No fever. No headache or ear pain. Slight runny nose. Began having a nonproductive cough today. No wheezing. No vomiting diarrhea or abdominal pain. Taking fluids well. Voiding normal limit. Also requesting a prescription for phentermine to start in a month or so after she goes on a cruise with her family. Also requesting motion sickness patches to help prevent seasickness.  Objective:   BP 124/84   Temp 98.8 F (37.1 C) (Oral)   Ht 5\' 8"  (1.727 m)   Wt 184 lb (83.5 kg)   BMI 27.98 kg/m  NAD. Alert, oriented. TMs retracted bilateral, no erythema. Pharynx mild erythema with PND noted. Neck supple with mild soft anterior adenopathy. Lungs clear. Heart regular rate rhythm.  Assessment:   Problem List Items Addressed This Visit      Other   Morbid obesity ([place])   Relevant Medications   phentermine (ADIPEX-P) 37.5 MG tablet    Other Visit Diagnoses    Acute upper respiratory infection    -  Primary       Plan:   Meds ordered this encounter  Medications  . scopolamine (TRANSDERM-SCOP) 1 MG/3DAYS    Sig: Place 1 patch (1.5 mg total) onto the skin every 3 (three) days. Replace patch. Apply behind ear.    Dispense:  4 patch    Refill:  0    Order Specific Question:   Supervising Provider    Answer:   Mikey Kirschner [[id number]]  . phentermine (ADIPEX-P) 37.5 MG tablet    Sig: Take 1 tablet (37.5 mg total) by mouth daily before breakfast.    Dispense:  30 tablet    Refill:  2    Order Specific Question:   Supervising Provider    Answer:   Mikey Kirschner [[id number]]  . amoxicillin-clavulanate (AUGMENTIN) 875-125 MG tablet    Sig: Take 1 tablet by mouth 2 (two) times daily.    Dispense:  20 tablet    Refill:  0    Order Specific Question:   Supervising Provider    Answer:   Maggie Font   Given prescription for antibiotics to start the next to 3 days if no improvement in  symptoms or if worse. Hold on phentermine until later this summer after her trip. Warning signs reviewed. Call back if symptoms worsen or persist. Also cautioned about potential adverse effects of scopolamine patches. Apply the night before her trip.

## 2017-03-07 NOTE — Telephone Encounter (Signed)
Spoke with patient and informed her per Linzie Collin- The oral form of this drug is no longer available. Two options: see if another pharmacy has it in stock (may still have some available) OR switch to OTC Meclizine as directed; comes in different brands. Patient verbalized understanding.

## 2017-06-10 ENCOUNTER — Other Ambulatory Visit: Payer: Self-pay | Admitting: Family Medicine

## 2017-06-10 DIAGNOSIS — Z1231 Encounter for screening mammogram for malignant neoplasm of breast: Secondary | ICD-10-CM

## 2017-06-16 ENCOUNTER — Other Ambulatory Visit: Payer: Self-pay | Admitting: Family Medicine

## 2017-06-16 NOTE — Telephone Encounter (Signed)
Last seen 03/07/2017

## 2017-06-23 ENCOUNTER — Ambulatory Visit: Payer: BC Managed Care – PPO

## 2017-07-03 ENCOUNTER — Ambulatory Visit
Admission: RE | Admit: 2017-07-03 | Discharge: 2017-07-03 | Disposition: A | Discharge status: Home / Self Care | Payer: BC Managed Care – PPO | Source: Ambulatory Visit | Attending: Family Medicine | Admitting: Family Medicine

## 2017-07-03 DIAGNOSIS — Z1231 Encounter for screening mammogram for malignant neoplasm of breast: Secondary | ICD-10-CM

## 2017-07-04 ENCOUNTER — Other Ambulatory Visit: Payer: Self-pay | Admitting: Family Medicine

## 2017-07-04 DIAGNOSIS — R928 Other abnormal and inconclusive findings on diagnostic imaging of breast: Secondary | ICD-10-CM

## 2017-07-10 ENCOUNTER — Ambulatory Visit
Admission: RE | Admit: 2017-07-10 | Discharge: 2017-07-10 | Disposition: A | Discharge status: Home / Self Care | Payer: BC Managed Care – PPO | Source: Ambulatory Visit | Attending: Family Medicine | Admitting: Family Medicine

## 2017-07-10 ENCOUNTER — Other Ambulatory Visit: Payer: Self-pay | Admitting: Family Medicine

## 2017-07-10 DIAGNOSIS — N6320 Unspecified lump in the left breast, unspecified quadrant: Secondary | ICD-10-CM

## 2017-07-10 DIAGNOSIS — R928 Other abnormal and inconclusive findings on diagnostic imaging of breast: Secondary | ICD-10-CM

## 2017-07-15 ENCOUNTER — Ambulatory Visit
Admission: RE | Admit: 2017-07-15 | Discharge: 2017-07-15 | Disposition: A | Discharge status: Home / Self Care | Payer: BC Managed Care – PPO | Source: Ambulatory Visit | Attending: Family Medicine | Admitting: Family Medicine

## 2017-07-15 ENCOUNTER — Other Ambulatory Visit: Payer: Self-pay | Admitting: Family Medicine

## 2017-07-15 DIAGNOSIS — N6320 Unspecified lump in the left breast, unspecified quadrant: Secondary | ICD-10-CM

## 2017-07-15 HISTORY — PX: BREAST BIOPSY: SHX20

## 2017-07-23 ENCOUNTER — Other Ambulatory Visit: Payer: Self-pay | Admitting: Family Medicine

## 2017-07-24 NOTE — Telephone Encounter (Signed)
Last seen for URI on 03/07/2017

## 2017-09-19 ENCOUNTER — Ambulatory Visit (INDEPENDENT_AMBULATORY_CARE_PROVIDER_SITE_OTHER): Payer: BC Managed Care – PPO

## 2017-09-19 DIAGNOSIS — Z23 Encounter for immunization: Secondary | ICD-10-CM | POA: Diagnosis not present

## 2017-10-31 ENCOUNTER — Ambulatory Visit: Payer: BC Managed Care – PPO | Admitting: Nurse Practitioner

## 2017-10-31 ENCOUNTER — Encounter: Payer: Self-pay | Admitting: Nurse Practitioner

## 2017-10-31 VITALS — BP 118/62 | Temp 98.4°F | Ht 68.0 in | Wt 191.2 lb

## 2017-10-31 DIAGNOSIS — M62838 Other muscle spasm: Secondary | ICD-10-CM

## 2017-10-31 DIAGNOSIS — G44209 Tension-type headache, unspecified, not intractable: Secondary | ICD-10-CM

## 2017-10-31 MED ORDER — PHENTERMINE HCL 37.5 MG PO TABS: 37.5000 mg | ORAL_TABLET | Freq: Every day | ORAL | 2 refills | Status: DC

## 2017-10-31 MED ORDER — CHLORZOXAZONE 500 MG PO TABS: 500.0000 mg | ORAL_TABLET | Freq: Three times a day (TID) | ORAL | 0 refills | Status: DC | PRN

## 2017-10-31 MED ORDER — MELOXICAM 15 MG PO TABS
15.0000 mg | ORAL_TABLET | Freq: Every day | ORAL | 0 refills | Status: DC
Start: 2017-10-31 — End: 2018-11-16

## 2017-10-31 NOTE — Progress Notes (Deleted)
[phone] 

## 2017-11-02 ENCOUNTER — Encounter: Payer: Self-pay | Admitting: Nurse Practitioner

## 2017-11-02 NOTE — Progress Notes (Signed)
Subjective:  Presents for c/o neck pain radiating into the occipital area for the past 10 days. Now having a dull headache x 2 d. Worse with certain movements. No numbness or weakness of the arms. Has had neck surgery in the past. No relief with Ibuprofen, ice or heat.   Objective:   BP 118/62   Temp 98.4 F (36.9 C) (Oral)   Ht 5\' 8"  (1.727 m)   Wt 191 lb 3.2 oz (86.7 kg)   BMI 29.07 kg/m  NAD. Alert, oriented. Lungs clear. Heart RRR. Tight tender muscles noted along right side of the neck, cervical and lateral. Can perform ROM of the neck with slight pain with lateral movement. Hand and arm strength 5+ bilat.   Assessment:  Muscle spasms of neck  Acute non intractable tension-type headache    Plan:   Meds ordered this encounter  Medications  . phentermine (ADIPEX-P) 37.5 MG tablet    Sig: Take 1 tablet (37.5 mg total) by mouth daily before breakfast.    Dispense:  30 tablet    Refill:  2    Order Specific Question:   Supervising Provider    Answer:   Mikey Kirschner [[id number]]  . chlorzoxazone (PARAFON) 500 MG tablet    Sig: Take 1 tablet (500 mg total) by mouth 3 (three) times daily as needed for muscle spasms.    Dispense:  30 tablet    Refill:  0    Order Specific Question:   Supervising Provider    Answer:   Mikey Kirschner [[id number]]  . meloxicam (MOBIC) 15 MG tablet    Sig: Take 1 tablet (15 mg total) by mouth daily.    Dispense:  30 tablet    Refill:  0    Order Specific Question:   Supervising Provider    Answer:   Mikey Kirschner [[id number]]   Patient also requesting to continue Adipex for 3 more months. Will hold after this.    Patient states she cannot do massage. Stretching exercises. TENS unit. Continue ice and heat applications. Discussed stress reduction. Warning signs reviewed. Return if symptoms worsen or fail to improve.

## 2017-11-28 ENCOUNTER — Other Ambulatory Visit: Payer: Self-pay | Admitting: Nurse Practitioner

## 2017-12-22 ENCOUNTER — Other Ambulatory Visit: Payer: Self-pay | Admitting: Family Medicine

## 2017-12-22 DIAGNOSIS — N6320 Unspecified lump in the left breast, unspecified quadrant: Secondary | ICD-10-CM

## 2018-01-15 ENCOUNTER — Ambulatory Visit
Admission: RE | Admit: 2018-01-15 | Discharge: 2018-01-15 | Disposition: A | Discharge status: Home / Self Care | Payer: BC Managed Care – PPO | Source: Ambulatory Visit | Attending: Family Medicine | Admitting: Family Medicine

## 2018-01-15 DIAGNOSIS — N6320 Unspecified lump in the left breast, unspecified quadrant: Secondary | ICD-10-CM

## 2018-05-26 ENCOUNTER — Other Ambulatory Visit: Payer: Self-pay | Admitting: Family Medicine

## 2018-05-26 DIAGNOSIS — Z1231 Encounter for screening mammogram for malignant neoplasm of breast: Secondary | ICD-10-CM

## 2018-07-09 ENCOUNTER — Ambulatory Visit
Admission: RE | Admit: 2018-07-09 | Discharge: 2018-07-09 | Disposition: A | Discharge status: Home / Self Care | Payer: BC Managed Care – PPO | Source: Ambulatory Visit | Attending: Family Medicine | Admitting: Family Medicine

## 2018-07-09 DIAGNOSIS — Z1231 Encounter for screening mammogram for malignant neoplasm of breast: Secondary | ICD-10-CM

## 2018-07-20 ENCOUNTER — Ambulatory Visit (INDEPENDENT_AMBULATORY_CARE_PROVIDER_SITE_OTHER): Payer: BC Managed Care – PPO

## 2018-07-20 DIAGNOSIS — Z23 Encounter for immunization: Secondary | ICD-10-CM | POA: Diagnosis not present

## 2018-07-27 ENCOUNTER — Other Ambulatory Visit: Payer: Self-pay | Admitting: *Deleted

## 2018-07-28 MED ORDER — CITALOPRAM HYDROBROMIDE 20 MG PO TABS: ORAL_TABLET | ORAL | 2 refills | Status: DC

## 2018-07-28 NOTE — Progress Notes (Signed)
May have 3 refills will need follow-up office visit before further

## 2018-07-28 NOTE — Progress Notes (Signed)
Prescription sent electronically to pharmacy. 

## 2018-08-17 ENCOUNTER — Other Ambulatory Visit: Payer: Self-pay | Admitting: *Deleted

## 2018-08-21 ENCOUNTER — Telehealth: Payer: Self-pay | Admitting: Family Medicine

## 2018-08-21 NOTE — Telephone Encounter (Signed)
Pharmacy requesting refill on Phentermine 37.5 mg tab. Take one tablet by mouth once daily before breakfast.

## 2018-08-22 NOTE — Telephone Encounter (Signed)
Controlled med Last seen greater than 3 months ago Please tell pharmacy deny Needs ov if wants

## 2018-08-24 NOTE — Telephone Encounter (Signed)
[name] is aware at [facility].

## 2018-08-24 NOTE — Telephone Encounter (Signed)
Patient is aware she will need an appt before further refills.She will call back here to set that up after looking at her schedule.

## 2018-09-07 ENCOUNTER — Other Ambulatory Visit: Payer: Self-pay | Admitting: Family Medicine

## 2018-09-07 NOTE — Telephone Encounter (Signed)
May have this +1 refill needs follow-up office visit with this medication

## 2018-09-14 ENCOUNTER — Telehealth: Payer: Self-pay | Admitting: Family Medicine

## 2018-09-14 ENCOUNTER — Other Ambulatory Visit: Payer: Self-pay | Admitting: Family Medicine

## 2018-09-14 MED ORDER — CEPHALEXIN 500 MG PO CAPS: 500.0000 mg | ORAL_CAPSULE | Freq: Four times a day (QID) | ORAL | 0 refills | Status: DC

## 2018-09-14 NOTE — Telephone Encounter (Signed)
Patient was in the office with her father The patient was having what she thought was a stye underneath the left eye We discussed in detail ways to go about and treat this Keflex 4 times daily for 7 days If ongoing troubles follow-up office visit

## 2018-09-25 ENCOUNTER — Ambulatory Visit: Payer: BC Managed Care – PPO | Admitting: Family Medicine

## 2018-09-25 ENCOUNTER — Encounter: Payer: Self-pay | Admitting: Family Medicine

## 2018-09-25 VITALS — BP 122/80 | Temp 98.8°F | Ht 68.0 in

## 2018-09-25 DIAGNOSIS — J019 Acute sinusitis, unspecified: Secondary | ICD-10-CM

## 2018-09-25 DIAGNOSIS — J111 Influenza due to unidentified influenza virus with other respiratory manifestations: Secondary | ICD-10-CM | POA: Diagnosis not present

## 2018-09-25 DIAGNOSIS — B9689 Other specified bacterial agents as the cause of diseases classified elsewhere: Secondary | ICD-10-CM

## 2018-09-25 MED ORDER — HYDROCODONE-HOMATROPINE 5-1.5 MG/5ML PO SYRP: ORAL_SOLUTION | ORAL | 0 refills | Status: DC

## 2018-09-25 MED ORDER — CEFPROZIL 500 MG PO TABS: 500.0000 mg | ORAL_TABLET | Freq: Two times a day (BID) | ORAL | 0 refills | Status: DC

## 2018-09-25 MED ORDER — OSELTAMIVIR PHOSPHATE 75 MG PO CAPS: 75.0000 mg | ORAL_CAPSULE | Freq: Two times a day (BID) | ORAL | 0 refills | Status: DC
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
## 2018-10-01 ENCOUNTER — Ambulatory Visit: Payer: BC Managed Care – PPO | Admitting: Family Medicine

## 2018-10-01 ENCOUNTER — Encounter: Payer: Self-pay | Admitting: Family Medicine

## 2018-10-01 VITALS — Temp 98.1°F | Ht 68.0 in

## 2018-10-01 DIAGNOSIS — J019 Acute sinusitis, unspecified: Secondary | ICD-10-CM

## 2018-10-01 DIAGNOSIS — B9689 Other specified bacterial agents as the cause of diseases classified elsewhere: Secondary | ICD-10-CM | POA: Diagnosis not present

## 2018-10-01 MED ORDER — HYDROCODONE-HOMATROPINE 5-1.5 MG/5ML PO SYRP: ORAL_SOLUTION | ORAL | 0 refills | Status: DC

## 2018-10-01 MED ORDER — AZITHROMYCIN 250 MG PO TABS: ORAL_TABLET | ORAL | 0 refills | Status: DC

## 2018-10-01 MED ORDER — BENZONATATE 100 MG PO CAPS: 100.0000 mg | ORAL_CAPSULE | Freq: Four times a day (QID) | ORAL | 0 refills | Status: DC | PRN

## 2018-10-01 NOTE — Progress Notes (Signed)
   Subjective:    Patient ID: [name], female    DOB: [birth year], 57 y.o.   MRN: [id number]  Sore Throat   This is a new problem. Episode onset: seen friday by Dr.Steve but has became worse. Associated symptoms include congestion, coughing and headaches. Pertinent negatives include no ear pain or shortness of breath. Associated symptoms comments: wheezing. Treatments tried: Robitussuin, ABT. The treatment provided no relief.   Cough worse at night(yellowish in color). Muscles in neck and head is hurting. No aches, no fever, no chills. Pt not able to sleep. Cough has made patient chest area hurt. Pt is out of Hycodan but states that it did not help Some production of mucous No sweats No chills No doe Some wheeze Mild nausea Very nice patient Treated last week for the flu and secondary infection   Review of Systems  Constitutional: Negative for activity change and fever.  HENT: Positive for congestion and rhinorrhea. Negative for ear pain.   Eyes: Negative for discharge.  Respiratory: Positive for cough. Negative for shortness of breath and wheezing.   Cardiovascular: Negative for chest pain.  Neurological: Positive for headaches.       Objective:   Physical Exam Vitals signs and nursing note reviewed.  Constitutional:      Appearance: She is well-developed.  HENT:     Head: Normocephalic.     Nose: Nose normal.     Mouth/Throat:     Pharynx: No oropharyngeal exudate.  Neck:     Musculoskeletal: Neck supple.  Cardiovascular:     Rate and Rhythm: Normal rate.     Heart sounds: Normal heart sounds. No murmur.  Pulmonary:     Effort: Pulmonary effort is normal.     Breath sounds: Normal breath sounds. No wheezing.  Lymphadenopathy:     Cervical: No cervical adenopathy.  Skin:    General: Skin is warm and dry.   No respiratory distress not in acute discomfort         Assessment & Plan:  Recent viral illness Also viral bronchitis Acute rhinosinusitis I do  not think the Cefzil is helping her I recommend stopping it We will go with Z-Pak to cover for atypicals including mycoplasma I do not recommend x-rays or lab work currently Warning signs were discussed Hycodan for evening use Tessalon for daytime use Rest up over the next few days should gradually get better

## 2018-10-01 NOTE — Patient Instructions (Signed)
Stop cefzil  Use Z Pack  May use Tessalon one three times a day as needed  Use Hycodan near evening

## 2018-10-09 ENCOUNTER — Other Ambulatory Visit: Payer: Self-pay | Admitting: Family Medicine

## 2018-10-09 ENCOUNTER — Telehealth: Payer: Self-pay | Admitting: Family Medicine

## 2018-10-09 MED ORDER — HYDROCODONE-HOMATROPINE 5-1.5 MG/5ML PO SYRP: ORAL_SOLUTION | ORAL | 0 refills | Status: DC

## 2018-10-09 NOTE — Telephone Encounter (Signed)
I sent in additional refill of her cough medicine to [place] The antibiotic that she was on she is finished with but the effect of it should last for 10 days If things are going better by Monday she should call us back give [place] further update on her symptoms she may need a second round of medication/antibiotic Certainly follow-up if worse

## 2018-10-09 NOTE — Telephone Encounter (Signed)
Pt was seen last week and is still having a really bad cough and it is keeping pt up at night. She would like to know if something can be called in to help with cough. Please send to [place], [place]

## 2018-10-09 NOTE — Telephone Encounter (Signed)
Patient advised Dr Nicki Reaper sent in additional refill of her cough medicine to [place] The antibiotic that she was on she is finished with but the effect of it should last for 10 days If things are not going better by Monday she should call us back give [place] further update on her symptoms she may need a second round of medication/antibiotic Certainly follow-up if worse. Patient verbalized understanding.

## 2018-10-09 NOTE — Telephone Encounter (Signed)
Please advise. Thank you

## 2018-10-10 IMAGING — [facility] DIGITAL DIAGNOSTIC UNILATERAL LEFT MAMMOGRAM WITH TOMO AND CAD
4 series · 5 of 12 positions shown · non-contrast
Comparison: Previous exam(s).

CLINICAL DATA: 57-year-old patient returns for six-month follow-up
of the left breast. In Wednesday June, 2017 she was recalled from a
screening mammogram for evaluation a possible mass in the left
breast. On diagnostic evaluation, multiple masses were identified in
the [DATE] position of the left breast, 2 of which were biopsied. Both
biopsied masses showed fibrocystic changes including apical
metaplasia, duct ectasia with periductal chronic inflammation. These
masses were at [DATE] position at 6 cm from the nipple and 7 cm from
the nipple respectively. Six-month follow-up was recommended, given
that there were 2 probably benign masses that were not biopsied.

EXAM:
DIGITAL DIAGNOSTIC LEFT MAMMOGRAM WITH CAD AND TOMO
ULTRASOUND LEFT BREAST

[L CC synth-2D]
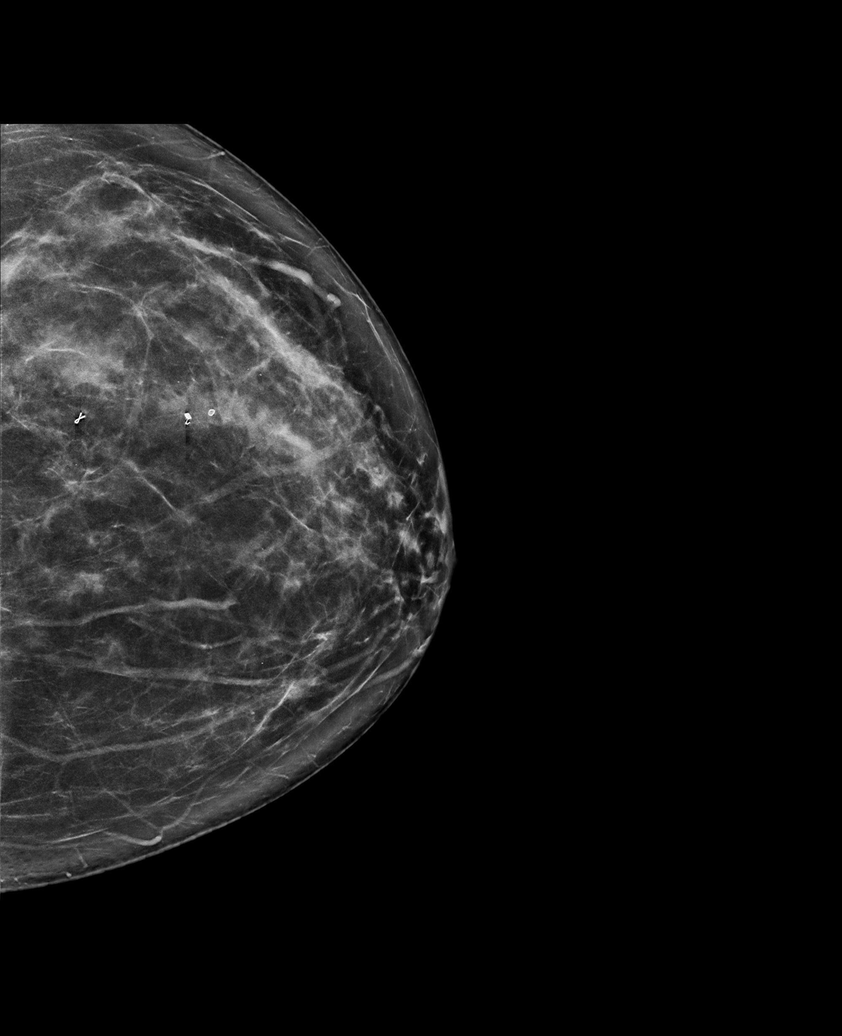

[L MLO synth-2D]
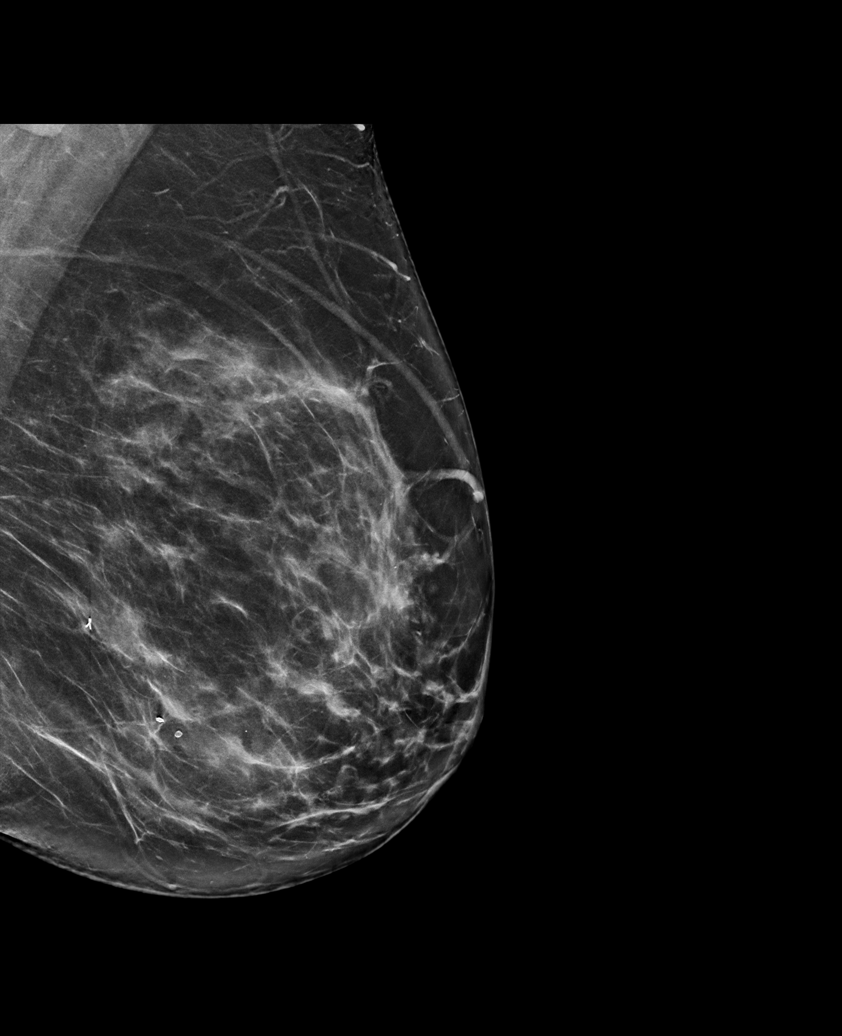

[L MLO tomo · 2 of 89 frames shown]
[frame 29/89]
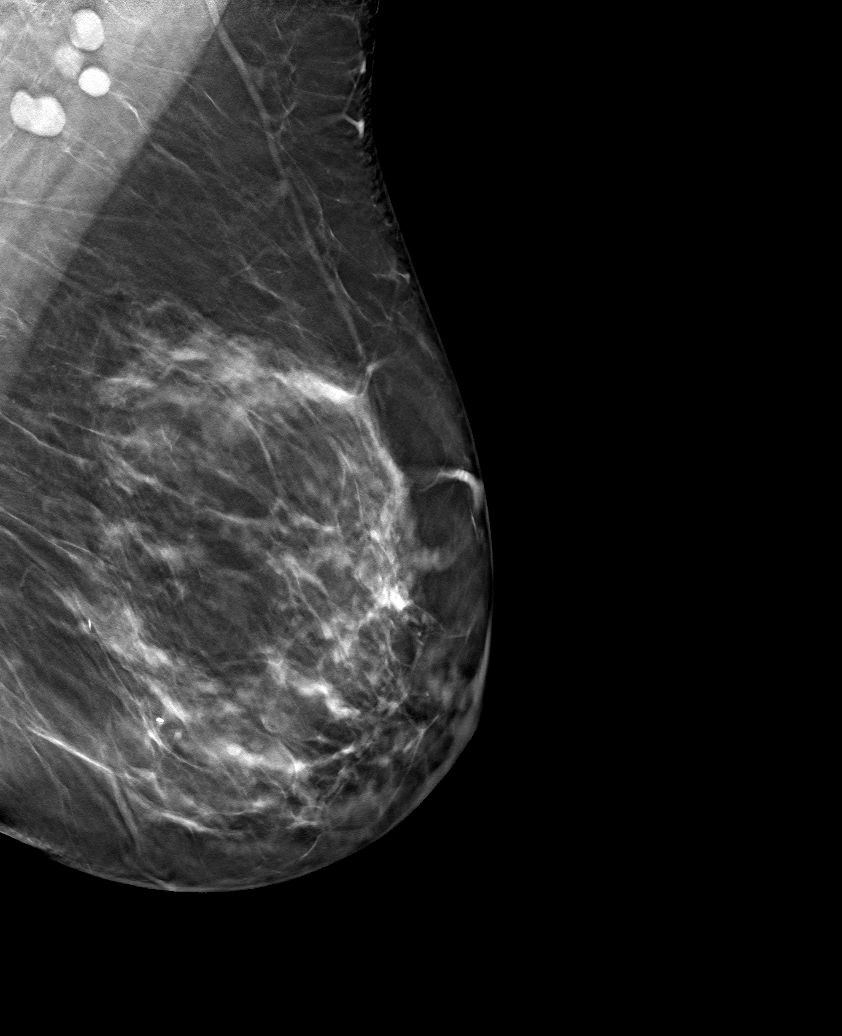
[frame 45/89]
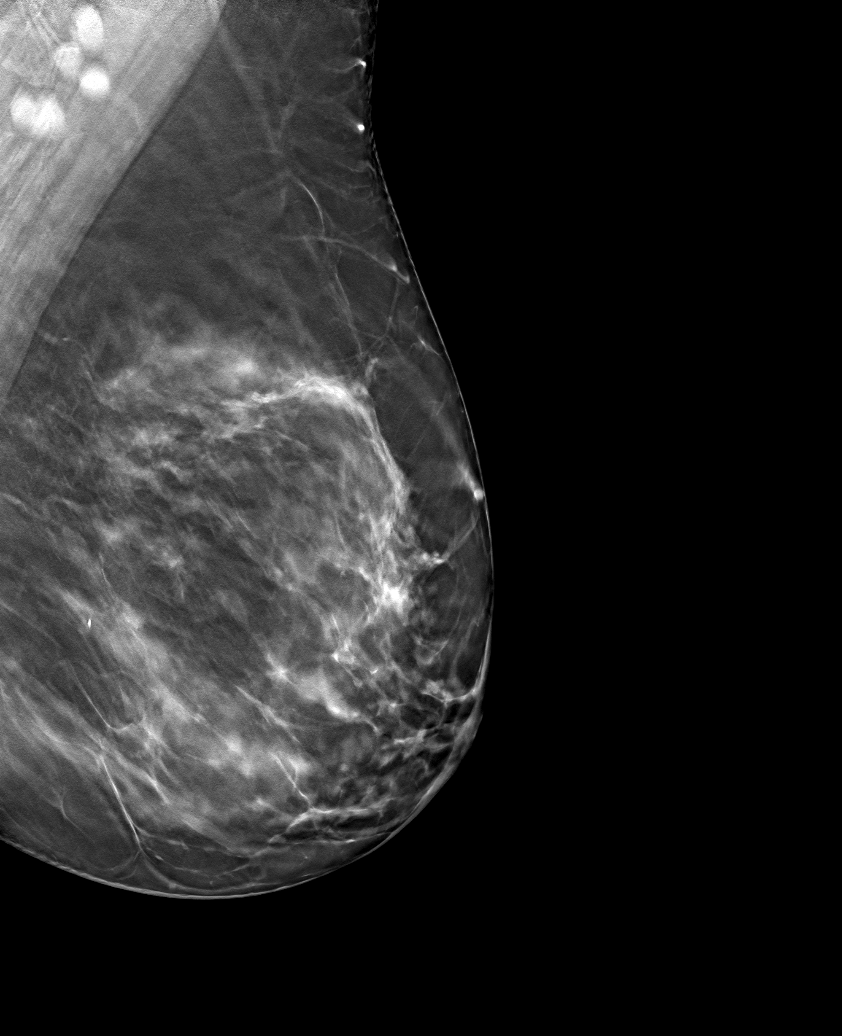

[L CC tomo · tomo slice 42/83.0]
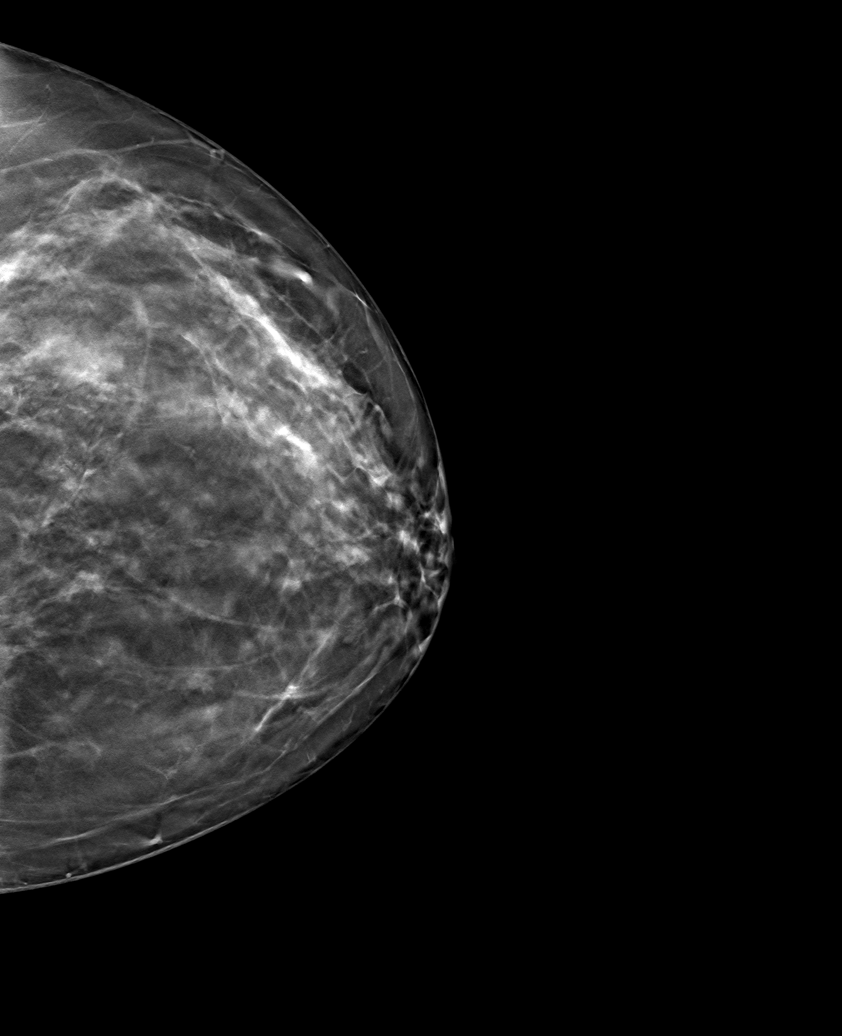

[5 of 12 positions shown; findings below may reference images not displayed]

ACR Breast Density Category b: There are scattered areas of
fibroglandular density.
FINDINGS: Ribbon and coil shaped biopsy clips are present in the 3 o'clock
axis of the left breast, middle to posterior thirds at the site of
prior benign biopsies. The nodularity seen in the 3 o'clock region
of the left breast on the prior mammogram July 2017 is less
prominent. No new or suspicious mass, architectural distortion, or
suspicious microcalcification is identified in the left breast.

Mammographic images were processed with CAD.

Targeted ultrasound is performed, showing a bilobed cyst with
minimal internal debris at [DATE] position 4 cm from nipple measuring
1.5 x 0.5 x 0.9 cm. There is no associated vascular flow. This is a
benign finding. At [DATE] position 4 cm from nipple is a hypoechoic
circumscribed oval nodule measuring 0.6 x 0.3 x 0.5 cm consistent
with a complicated cyst. This is similar in size, previously
measuring 0.5 x 0.4 x 0.5 cm.

Previously biopsied mass at [DATE] position 6 cm from the nipple as
described on ultrasound July 10, 2017 is not discretely
visualized today, suggesting interval resolution or significant
decrease in size of fibrocystic changes in this location.

Additionally, previously described irregular hypoechoic mass in the
[DATE] position 7 cm from the nipple is not identified by ultrasound
today.
IMPRESSION: No suspicious findings in the left breast. Interval decrease in the
overall number/size of previously biopsied left breast masses. There
is remaining simple cyst and a stable complicated cyst in the [DATE]
position. There is no architectural distortion.

RECOMMENDATION:
Bilateral screening mammogram is recommended in June 2018.

I have discussed the findings and recommendations with the patient.
Results were also provided in writing at the conclusion of the
visit. If applicable, a reminder letter will be sent to the patient
regarding the next appointment.

BI-RADS CATEGORY  2: Benign.

## 2018-10-12 ENCOUNTER — Ambulatory Visit: Payer: BC Managed Care – PPO | Admitting: Family Medicine

## 2018-10-12 ENCOUNTER — Encounter: Payer: Self-pay | Admitting: Family Medicine

## 2018-10-12 VITALS — BP 134/84 | Temp 97.5°F

## 2018-10-12 DIAGNOSIS — J019 Acute sinusitis, unspecified: Secondary | ICD-10-CM

## 2018-10-12 DIAGNOSIS — J4520 Mild intermittent asthma, uncomplicated: Secondary | ICD-10-CM

## 2018-10-12 DIAGNOSIS — B9689 Other specified bacterial agents as the cause of diseases classified elsewhere: Secondary | ICD-10-CM

## 2018-10-12 MED ORDER — DOXYCYCLINE HYCLATE 100 MG PO TABS: 100.0000 mg | ORAL_TABLET | Freq: Two times a day (BID) | ORAL | 0 refills | Status: DC

## 2018-10-12 MED ORDER — BENZONATATE 100 MG PO CAPS: 100.0000 mg | ORAL_CAPSULE | Freq: Four times a day (QID) | ORAL | 0 refills | Status: AC | PRN

## 2018-10-12 MED ORDER — ALBUTEROL SULFATE HFA 108 (90 BASE) MCG/ACT IN AERS: 2.0000 | INHALATION_SPRAY | Freq: Four times a day (QID) | RESPIRATORY_TRACT | 2 refills | Status: DC | PRN

## 2018-10-12 NOTE — Progress Notes (Signed)
   Subjective:    Patient ID: [name], female    DOB: 29-Dec-1960, 57 y.o.   MRN: [id number]  Cough  This is a recurrent problem. The current episode started 1 to 4 weeks ago. The cough is productive of sputum. Associated symptoms include headaches and wheezing. Treatments tried: ABTs; cough med. The treatment provided mild relief.  Pt states that this is her 3rd visit to office for cough. Pt states cough gets worse at night.    Persistent coug and cong in the chest    Pt satrted with the flu    Now persistent cough and gunky    energy level  Not the best  Just a ittle whezines a bit    Review of Systems  Respiratory: Positive for cough and wheezing.   Neurological: Positive for headaches.       Objective:   Physical Exam  Active, active alert moderate malaise HEENT mild nasal congestion pharynx normal neck supple lungs wheezy cough intermittently no tachypnea clear      Assessment & Plan:  Impression post influenza persistent rhinosinusitis/bronchitis with element of reactive airways plan albuterol 2 sprays 4 times daily use faithfully.  Doxycycline 100 twice daily 10 days symptom care discussed.  If persists beyond the next week or 2 need to start considering a more in-depth reactive airway management

## 2018-11-03 ENCOUNTER — Ambulatory Visit (HOSPITAL_COMMUNITY)
Admission: RE | Admit: 2018-11-03 | Discharge: 2018-11-03 | Disposition: A | Discharge status: Home / Self Care | Payer: BC Managed Care – PPO | Source: Ambulatory Visit | Attending: Family Medicine | Admitting: Family Medicine

## 2018-11-03 ENCOUNTER — Other Ambulatory Visit (HOSPITAL_COMMUNITY)
Admission: RE | Admit: 2018-11-03 | Discharge: 2018-11-03 | Disposition: A | Discharge status: Home / Self Care | Payer: BC Managed Care – PPO | Source: Ambulatory Visit | Attending: *Deleted | Admitting: *Deleted

## 2018-11-03 ENCOUNTER — Encounter: Payer: Self-pay | Admitting: Family Medicine

## 2018-11-03 ENCOUNTER — Ambulatory Visit: Payer: BC Managed Care – PPO | Admitting: Family Medicine

## 2018-11-03 ENCOUNTER — Telehealth: Payer: Self-pay | Admitting: *Deleted

## 2018-11-03 VITALS — BP 112/80 | Ht 68.0 in

## 2018-11-03 DIAGNOSIS — M79604 Pain in right leg: Secondary | ICD-10-CM

## 2018-11-03 DIAGNOSIS — M7989 Other specified soft tissue disorders: Secondary | ICD-10-CM | POA: Insufficient documentation

## 2018-11-03 LAB — D-DIMER, QUANTITATIVE (NOT AT ARMC): D DIMER QUANT: 0.52 ug{FEU}/mL — AB (ref 0.00–0.50)

## 2018-11-03 NOTE — Telephone Encounter (Signed)
Stat US results in epic and d dimer pending. No dvt per dr Richardson Landry. Pt notified of result and notified we will call her tomorrow with full report after getting bw results. Pt verbalized understanding.

## 2018-11-03 NOTE — Progress Notes (Signed)
   Subjective:    Patient ID: [name], female    DOB: 1961/04/01, 58 y.o.   MRN: [id number]  HPI  Patient is here today with pain in her right calf and ankle pain. This started 3-4 days ago. She states she has had a pain in the calf of the right leg feels kind of like the after affects of a charlie horse.   She has not had a charlie horse that she knows of and states her right ankle is a little more swollen than it supposed to be. She is concerned with whether or not she has a blood clot.   Ankle painful for a coupel  Of days   No hormone supplemental   No hx of clot  Hx of pre eclampsia   Notes sone head eiscomforyt  Left lat head     No    tendrness  Feels odd  Pt experienced  No injury to leg   No cramps   Using foot massage   occads ibuporofen   No reg meds   No meds  No major hx of swelling  No fam hx   Of such      She also states she has an odd feeling on her left side of back of head.Ongoing the last few days.   Review of Systems No headache, no major weight loss or weight gain, no chest pain no back pain abdominal pain no change in bowel habits complete ROS otherwise negative     Objective:   Physical Exam  Alert and oriented, vitals reviewed and stable, NAD ENT-TM's and ext canals WNL bilat via otoscopic exam Soft palate, tonsils and post pharynx WNL via oropharyngeal exam Neck-symmetric, no masses; thyroid nonpalpable and nontender Pulmonary-no tachypnea or accessory muscle use; Clear without wheezes via auscultation Card--no abnrml murmurs, rhythm reg and rate WNL Carotid pulses symmetric, without bruits Right leg tender posterior calf.  Arterial pulses good.  Trace venous stasis.  Skin normal sensation intact knee normal ankle normal impression 1 calf pain with tenderness.  Discussed at length.  Good idea to rule out DVT.  Rationale discussed.  D-dimer plus right leg ultrasound  2.  Nonspecific left-sided symptomatology of  the head.  Very nonspecific.  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  At this time would monitor and manage if continues recommend follow-up with Dr. Nicki Reaper      Assessment & Plan:

## 2018-11-04 ENCOUNTER — Other Ambulatory Visit: Payer: Self-pay

## 2018-11-04 ENCOUNTER — Ambulatory Visit (HOSPITAL_COMMUNITY): Payer: BC Managed Care – PPO

## 2018-11-04 DIAGNOSIS — Z0189 Encounter for other specified special examinations: Secondary | ICD-10-CM

## 2018-11-04 DIAGNOSIS — R7989 Other specified abnormal findings of blood chemistry: Secondary | ICD-10-CM

## 2018-11-04 DIAGNOSIS — R799 Abnormal finding of blood chemistry, unspecified: Secondary | ICD-10-CM

## 2018-11-09 ENCOUNTER — Ambulatory Visit (HOSPITAL_COMMUNITY)
Admission: RE | Admit: 2018-11-09 | Discharge: 2018-11-09 | Disposition: A | Discharge status: Home / Self Care | Payer: BC Managed Care – PPO | Source: Ambulatory Visit | Attending: Family Medicine | Admitting: Family Medicine

## 2018-11-09 DIAGNOSIS — R799 Abnormal finding of blood chemistry, unspecified: Secondary | ICD-10-CM | POA: Diagnosis not present

## 2018-11-09 DIAGNOSIS — R7989 Other specified abnormal findings of blood chemistry: Secondary | ICD-10-CM | POA: Diagnosis present

## 2018-11-16 ENCOUNTER — Encounter: Payer: Self-pay | Admitting: Family Medicine

## 2018-11-16 ENCOUNTER — Ambulatory Visit (HOSPITAL_COMMUNITY)
Admission: RE | Admit: 2018-11-16 | Discharge: 2018-11-16 | Disposition: A | Discharge status: Home / Self Care | Payer: BC Managed Care – PPO | Source: Ambulatory Visit | Attending: Family Medicine | Admitting: Family Medicine

## 2018-11-16 ENCOUNTER — Ambulatory Visit: Payer: BC Managed Care – PPO | Admitting: Family Medicine

## 2018-11-16 VITALS — BP 120/78 | Ht 68.0 in

## 2018-11-16 DIAGNOSIS — R51 Headache: Secondary | ICD-10-CM | POA: Diagnosis not present

## 2018-11-16 DIAGNOSIS — Z131 Encounter for screening for diabetes mellitus: Secondary | ICD-10-CM | POA: Diagnosis not present

## 2018-11-16 DIAGNOSIS — M542 Cervicalgia: Secondary | ICD-10-CM | POA: Diagnosis present

## 2018-11-16 DIAGNOSIS — Z13220 Encounter for screening for lipoid disorders: Secondary | ICD-10-CM

## 2018-11-16 DIAGNOSIS — R519 Headache, unspecified: Secondary | ICD-10-CM

## 2018-11-16 NOTE — Progress Notes (Signed)
   Subjective:    Patient ID: [name], female    DOB: January 07, 1961, 59 y.o.   MRN: [id number]  HPI  Patient arrives for a follow up on  right leg pain.  Patient states her leg is still hurting in her thigh.  States the leg doing much better compared to where it was   Patient also had migraine yesterday and just feeling weird feeling in her head. Patient denies any pulsatile sensations.  Denies unilateral numbness weakness.  Denies blurred vision nausea vomiting diarrhea.  Does not wake her up at night.  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  HENT: Negative for congestion and rhinorrhea.   Respiratory: Negative for cough and shortness of breath.   Cardiovascular: Negative for chest pain and leg swelling.  Gastrointestinal: Negative for abdominal pain, nausea and vomiting.  Skin: Negative for color change.  Neurological: Positive for headaches. Negative for dizziness and weakness.  Psychiatric/Behavioral: Negative for agitation and confusion.       Objective:   Physical Exam Vitals signs reviewed.  Constitutional:      General: She is not in acute distress. HENT:     Head: Normocephalic.  Cardiovascular:     Rate and Rhythm: Normal rate and regular rhythm.     Heart sounds: Normal heart sounds. No murmur.  Pulmonary:     Effort: Pulmonary effort is normal.     Breath sounds: Normal breath sounds.  Lymphadenopathy:     Cervical: No cervical adenopathy.  Neurological:     Mental Status: She is alert.  Psychiatric:        Behavior: Behavior normal.      Screening lab work recommended     Assessment & Plan:  This patient has had some atypical  headaches/left neck pain that occurs with certain movements or motions does not occur when she is at rest does not wake her up at night  More than likely this is related to the cervical spine with impingement recommend x-rays of the cervical spine patient will keep track of these symptoms and she will send me an update within 4 to 6 weeks on how this is doing may need consultation with neurology or MRI if worsens I find no evidence of tumor, hemorrhage, stroke, aneurysm  As for the leg is gradually getting better ultrasounds were negative I do not recommend any further work-up unless has increased issues

## 2018-11-17 LAB — LIPID PANEL
Chol/HDL Ratio: 5 ratio — ABNORMAL HIGH (ref 0.0–4.4)
Cholesterol, Total: 246 mg/dL — ABNORMAL HIGH (ref 100–199)
HDL: 49 mg/dL (ref >39)
LDL Calculated: 159 mg/dL — ABNORMAL HIGH (ref 0–99)
Triglycerides: 189 mg/dL — ABNORMAL HIGH (ref 0–149)
VLDL Cholesterol Cal: 38 mg/dL (ref 5–40)

## 2018-11-17 LAB — GLUCOSE, RANDOM: Glucose: 98 mg/dL (ref 65–99)

## 2018-11-25 ENCOUNTER — Other Ambulatory Visit: Payer: Self-pay | Admitting: Family Medicine

## 2019-04-06 ENCOUNTER — Other Ambulatory Visit: Payer: Self-pay | Admitting: Family Medicine

## 2019-04-06 ENCOUNTER — Telehealth: Payer: Self-pay | Admitting: Family Medicine

## 2019-04-06 MED ORDER — HYDROCODONE-ACETAMINOPHEN 5-325 MG PO TABS: 1.0000 | ORAL_TABLET | Freq: Four times a day (QID) | ORAL | 0 refills | Status: AC | PRN

## 2019-04-06 MED ORDER — HYDROCODONE-ACETAMINOPHEN 5-325 MG PO TABS: 1.0000 | ORAL_TABLET | Freq: Four times a day (QID) | ORAL | 0 refills | Status: DC | PRN

## 2019-04-06 NOTE — Telephone Encounter (Signed)
Pt contacted and verbalized understanding.  

## 2019-04-06 NOTE — Telephone Encounter (Signed)
Pain medication-hydrocodone sent to [facility] 1 tablet every 6 hours as needed for severe pain caution drowsiness home use only May use ibuprofen as needed Cool compresses 20 minutes at a time several times a day May use a stiff soled shoe if necessary Could take several weeks for this to heal the progressive troubles or problems notify us

## 2019-04-06 NOTE — Telephone Encounter (Signed)
Pt contacted office regarding her hitting her pinky toe on the refrigerator last night. Pt states the pinky toe is now black and blue and painful. Pain is radiating to ankle. Pt has been putting ice on the area and taking Ibuprofen but has not helped. Spoke with Dr.Scott and Dr.Scott states that pt can do home care with cold compresses; we could order an xray or pt can come to office and provider would come out and like at toe. Pt states she would like to know if something can be called in for the pain. Pt doesn't really want to go to expense of having xray and coming up to office. Please advise. Thank you  [facility]

## 2019-05-10 ENCOUNTER — Telehealth: Payer: Self-pay | Admitting: Family Medicine

## 2019-05-10 NOTE — Telephone Encounter (Signed)
This patient called regarding foot pain and discomfort we will see her in the office COVID screen negative-patient advised to call us back if any COVID symptoms Reason for visit foot pain Please give her Thursday at 1:10 PM with Dr. Nicki Reaper

## 2019-05-11 NOTE — Telephone Encounter (Signed)
I tried to call patient a couple of times and no answer and mailbox is full. I will continue to try.  1:10 appt Thursday has already been taken.

## 2019-05-13 ENCOUNTER — Other Ambulatory Visit: Payer: Self-pay

## 2019-05-13 ENCOUNTER — Ambulatory Visit: Payer: BC Managed Care – PPO | Admitting: Family Medicine

## 2019-05-13 ENCOUNTER — Encounter: Payer: Self-pay | Admitting: Family Medicine

## 2019-05-13 VITALS — BP 132/86 | Temp 98.0°F

## 2019-05-13 DIAGNOSIS — M722 Plantar fascial fibromatosis: Secondary | ICD-10-CM

## 2019-05-13 MED ORDER — NAPROXEN 500 MG PO TABS: 500.0000 mg | ORAL_TABLET | Freq: Two times a day (BID) | ORAL | 0 refills | Status: DC

## 2019-05-13 MED ORDER — CITALOPRAM HYDROBROMIDE 20 MG PO TABS: ORAL_TABLET | ORAL | 1 refills | Status: DC

## 2019-05-13 NOTE — Progress Notes (Signed)
   Subjective:    Patient ID: [name], female    DOB: [birth year], 57 y.o.   MRN: [id number]  Foot Pain This is a new problem. Episode onset: June 2020. Pertinent negatives include no abdominal pain, chest pain, congestion, coughing, nausea, vomiting or weakness. She has tried oral narcotics for the symptoms.  Pt states she hit her pinky toe on a water liner and it became really bruised. Pt sent my chart message back in June. Pt states that her toe/foot is constantly throbbing. Sometimes the pain feels like it is traveling to leg. Pt has been taking Hydrocodone one tablet that was prescribed back in June  Review of Systems  Constitutional: Negative for activity change and appetite change.  HENT: Negative for congestion and rhinorrhea.   Respiratory: Negative for cough and shortness of breath.   Cardiovascular: Negative for chest pain and leg swelling.  Gastrointestinal: Negative for abdominal pain, nausea and vomiting.  Skin: Negative for color change.  Neurological: Negative for dizziness and weakness.  Psychiatric/Behavioral: Negative for agitation and confusion.       Objective:   Physical Exam On physical exam calf normal knee normal ankle normal and there is tenderness in the calcaneal bone in the lateral aspect of the foot the small toe where she had her injury is actually healed up       Assessment & Plan:  Calcaneal tenderness possible plantar fasciitis stretching exercises anti-inflammatories for short span of time follow-up if progressive troubles or if worse  If not seeing significant improvement over the next 2 weeks next step would be referral to podiatry

## 2019-05-15 ENCOUNTER — Other Ambulatory Visit: Payer: Self-pay | Admitting: Family Medicine

## 2019-05-17 NOTE — Telephone Encounter (Signed)
This +2 refills will need follow-up office visit regarding this medication by fall

## 2019-07-15 ENCOUNTER — Other Ambulatory Visit: Payer: Self-pay | Admitting: Family Medicine

## 2019-07-15 DIAGNOSIS — Z1231 Encounter for screening mammogram for malignant neoplasm of breast: Secondary | ICD-10-CM

## 2019-08-13 ENCOUNTER — Encounter: Payer: Self-pay | Admitting: Family Medicine

## 2019-08-13 ENCOUNTER — Telehealth: Payer: Self-pay | Admitting: Family Medicine

## 2019-08-13 MED ORDER — PREDNISONE 20 MG PO TABS: ORAL_TABLET | ORAL | 0 refills | Status: DC

## 2019-08-13 NOTE — Telephone Encounter (Signed)
I really believe that first step would be is utilizing OTC allergy medicine Is she taking anything currently? Typically what I would recommend is an Allegra 180 mg 1 daily And for the eyes I would recommend Opthcon A   If she is already tried OTC measures I can send in a prescription allergy eyedrops to utilize with her Allegra

## 2019-08-13 NOTE — Telephone Encounter (Signed)
Contacted pt. Pt verbalized understanding. Pt would like to be seen at her fathers house at his visit.(father has home visit on 08/17/2019 at 4:10pm). Prednisone sent to pharmacy

## 2019-08-13 NOTE — Telephone Encounter (Signed)
So this is more difficult than what can be handled via telephone messaging back-and-forth Ideally this would be done as a office visit I would recommend the patient take pictures of this rash In the meantime prednisone 20 mg 2 daily for the next 5 days Then I would recommend office visit or virtual visit to discuss further

## 2019-08-13 NOTE — Telephone Encounter (Signed)
Please advise. Thank you

## 2019-08-13 NOTE — Telephone Encounter (Signed)
Patient is doing remodeling at her house and she has been in a lot of dust and debris.  She thinks she is reacting to that.  Her eyes are itching and watering a lot and now she has spots on her face.  She said she has had allergic reactions to stuff in the past.  Wanted to know if there was something we could call in to help with this.  [facility]

## 2019-08-13 NOTE — Telephone Encounter (Signed)
Tried to contact pt. Someone picked up and then hung up. Will try again later

## 2019-08-13 NOTE — Telephone Encounter (Signed)
Pt states she has welps on her face and they itch. Right eye is swollen and itchy. Pt has not tried any treatments.informed pt of provider recommendations but  Pt states she not sure if she is having an allergic reaction or what. Please advise. Thank you

## 2019-08-15 ENCOUNTER — Telehealth: Payer: Self-pay | Admitting: Family Medicine

## 2019-08-15 NOTE — Telephone Encounter (Signed)
Please see her previous phone message  Patient states that she would do consultation with me when I go to see her father for a home visit I am fine with doing that but it is important for her to be put onto the schedule as a home visit as well Tuesday afternoon at [address] the nurses can work her up as well That will allow me to see the patient and document accordingly thank you

## 2019-08-16 NOTE — Telephone Encounter (Signed)
Pt has been notified.

## 2019-08-16 NOTE — Telephone Encounter (Signed)
Please call pt and let her know and then add her to schedule

## 2019-08-17 ENCOUNTER — Other Ambulatory Visit: Payer: Self-pay

## 2019-08-17 ENCOUNTER — Encounter: Payer: Self-pay | Admitting: Family Medicine

## 2019-08-17 ENCOUNTER — Ambulatory Visit (INDEPENDENT_AMBULATORY_CARE_PROVIDER_SITE_OTHER): Payer: BC Managed Care – PPO | Admitting: Family Medicine

## 2019-08-17 DIAGNOSIS — L259 Unspecified contact dermatitis, unspecified cause: Secondary | ICD-10-CM | POA: Diagnosis not present

## 2019-08-17 DIAGNOSIS — H00015 Hordeolum externum left lower eyelid: Secondary | ICD-10-CM

## 2019-08-17 NOTE — Progress Notes (Signed)
   Subjective:    Patient ID: [name], female    DOB: [birth year], 58 y.o.   MRN: [id number]  HPI Pt had rash come up on face a couple of days ago. Pt states she is to take her last dose of Prednisone today. Pt states she is now having a sty come up on right eye.  Patient with a rash on her face that seems to be getting better this occurred after being exposed to some drywall she thinks it may have triggered an allergic reaction the prednisone helped now she has a stye underneath the right eye that is tender and painful to the touch denies blurred vision  Review of Systems  Constitutional: Negative for activity change and fever.  HENT: Negative for congestion, ear pain and rhinorrhea.   Eyes: Negative for discharge.       Tenderness lower eyelid consistent with stye infection  Respiratory: Negative for cough, shortness of breath and wheezing.   Cardiovascular: Negative for chest pain.       Objective:   Physical Exam Vitals signs reviewed.  Constitutional:      General: She is not in acute distress. HENT:     Head: Normocephalic.  Neurological:     Mental Status: She is alert.  Psychiatric:        Behavior: Behavior normal.   Patient with rash is clearing I reviewed over the pictures she sent her to ER She does have a stye underneath the right Contact dermatitis left ring finger       Assessment & Plan:  Stye-antibiotics Facial rash doing better with the prednisone no need for treatment Area of the left midbrain finger contact dermatitis triamcinolone Antibiotic triamcinolone sent into the pharmacy [facility]

## 2019-08-18 ENCOUNTER — Encounter: Payer: Self-pay | Admitting: Family Medicine

## 2019-08-19 ENCOUNTER — Other Ambulatory Visit: Payer: Self-pay

## 2019-08-19 ENCOUNTER — Ambulatory Visit
Admission: RE | Admit: 2019-08-19 | Discharge: 2019-08-19 | Disposition: A | Discharge status: Home / Self Care | Payer: BC Managed Care – PPO | Source: Ambulatory Visit | Attending: Family Medicine | Admitting: Family Medicine

## 2019-08-19 DIAGNOSIS — Z1231 Encounter for screening mammogram for malignant neoplasm of breast: Secondary | ICD-10-CM

## 2019-10-12 ENCOUNTER — Ambulatory Visit (INDEPENDENT_AMBULATORY_CARE_PROVIDER_SITE_OTHER): Payer: BC Managed Care – PPO | Admitting: Family Medicine

## 2019-10-12 ENCOUNTER — Other Ambulatory Visit: Payer: Self-pay

## 2019-10-12 DIAGNOSIS — L03031 Cellulitis of right toe: Secondary | ICD-10-CM | POA: Diagnosis not present

## 2019-10-12 MED ORDER — CEPHALEXIN 500 MG PO CAPS: 500.0000 mg | ORAL_CAPSULE | Freq: Four times a day (QID) | ORAL | 0 refills | Status: DC

## 2019-10-12 NOTE — Progress Notes (Signed)
   Subjective:    Patient ID: [name], female    DOB: 1961/04/30, 59 y.o.   MRN: [id number]  HPI  Patient calls with redness around cuticle on right toe this am -started with itching yesterday. She relates the toe is red slightly swollen denies high fever chills sweats denies any setbacks currently She relates it is draining a little bit of pus slightly tender She denies any other major setbacks. Virtual Visit via Video Note  I connected with [name] on 10/12/19 at  9:30 AM EST by a video enabled telemedicine application and verified that I am speaking with the correct person using two identifiers.  Location: Patient: home Provider: office   I discussed the limitations of evaluation and management by telemedicine and the availability of in person appointments. The patient expressed understanding and agreed to proceed.  History of Present Illness:    Observations/Objective:   Assessment and Plan:   Follow Up Instructions:    I discussed the assessment and treatment plan with the patient. The patient was provided an opportunity to ask questions and all were answered. The patient agreed with the plan and demonstrated an understanding of the instructions.   The patient was advised to call back or seek an in-person evaluation if the symptoms worsen or if the condition fails to improve as anticipated.  I provided 15 minutes of non-face-to-face time during this encounter.     Review of Systems  Constitutional: Negative for activity change and appetite change.  HENT: Negative for congestion and rhinorrhea.   Respiratory: Negative for cough and shortness of breath.   Cardiovascular: Negative for chest pain and leg swelling.  Gastrointestinal: Negative for abdominal pain, nausea and vomiting.  Skin: Negative for color change.  Neurological: Negative for dizziness and weakness.  Psychiatric/Behavioral: Negative for agitation and confusion.       Objective:   Physical Exam Patient had virtual visit Appears to be in no distress Atraumatic Neuro able to relate and oriented No apparent resp distress Color good toe is red with exudative area concerning for the possibility of infection        Assessment & Plan:  Steroid cream she has triamcinolone twice daily to help with rash Also recommend antibiotics Patient will give [place] an update within a week She will follow-up if progressive troubles or worse

## 2019-10-15 ENCOUNTER — Encounter: Payer: Self-pay | Admitting: Family Medicine

## 2019-10-16 ENCOUNTER — Other Ambulatory Visit: Payer: Self-pay | Admitting: Family Medicine

## 2019-10-16 MED ORDER — DOXYCYCLINE HYCLATE 100 MG PO TABS: 100.0000 mg | ORAL_TABLET | Freq: Two times a day (BID) | ORAL | 0 refills | Status: DC

## 2019-10-16 MED ORDER — MOMETASONE FUROATE 0.1 % EX CREA: TOPICAL_CREAM | CUTANEOUS | 1 refills | Status: DC

## 2019-10-18 NOTE — Telephone Encounter (Signed)
Pt states her toe does seem to be getting better since changing antibiotic. States she talked with dr Nicki Reaper after sending in this message and does not need anything at this time. Will call back if anything changes.

## 2019-10-18 NOTE — Telephone Encounter (Signed)
Tried to call no answer

## 2019-10-19 ENCOUNTER — Encounter: Payer: Self-pay | Admitting: Family Medicine

## 2019-10-19 NOTE — Telephone Encounter (Signed)
Nurses We appreciate the update Please communicate with patient to send [place] an update on Friday morning or Thursday night so I can review on Friday.  We should hopefully see some progress at that time thank you

## 2019-10-20 ENCOUNTER — Other Ambulatory Visit: Payer: Self-pay

## 2019-10-20 ENCOUNTER — Ambulatory Visit (INDEPENDENT_AMBULATORY_CARE_PROVIDER_SITE_OTHER): Payer: BC Managed Care – PPO | Admitting: Family Medicine

## 2019-10-20 DIAGNOSIS — S29019A Strain of muscle and tendon of unspecified wall of thorax, initial encounter: Secondary | ICD-10-CM

## 2019-10-20 DIAGNOSIS — M545 Low back pain: Secondary | ICD-10-CM | POA: Diagnosis not present

## 2019-10-20 MED ORDER — TIZANIDINE HCL 4 MG PO TABS: 4.0000 mg | ORAL_TABLET | Freq: Three times a day (TID) | ORAL | 0 refills | Status: DC | PRN

## 2019-10-20 MED ORDER — ETODOLAC 400 MG PO TABS: 400.0000 mg | ORAL_TABLET | Freq: Two times a day (BID) | ORAL | 0 refills | Status: DC | PRN

## 2019-10-20 NOTE — Progress Notes (Signed)
   Subjective:  Audio video  Patient ID: [name], female    DOB: April 18, 1961, 59 y.o.   MRN: [id number]  HPI  Patient calls with low back pain-more on the right side- that started in the early morning hours. Patient was sleeping on cot while staying with her father. Patient states the pain takes her breath away with movement.  Virtual Visit via Video Note  I connected with Sheeva Boehl Wendt on 10/20/19 at  3:30 PM EST by a video enabled telemedicine application and verified that I am speaking with the correct person using two identifiers.  Location: Patient: home Provider: office   I discussed the limitations of evaluation and management by telemedicine and the availability of in person appointments. The patient expressed understanding and agreed to proceed.  History of Present Illness:    Observations/Objective:   Assessment and Plan:   Follow Up Instructions:    I discussed the assessment and treatment plan with the patient. The patient was provided an opportunity to ask questions and all were answered. The patient agreed with the plan and demonstrated an understanding of the instructions.   The patient was advised to call back or seek an in-person evaluation if the symptoms worsen or if the condition fails to improve as anticipated.  I provided 20 minutes of non-face-to-face time during this encounter.  Patient has been doing a lot of lifting recently.  Helping elderly father.  Sleeping and abnormal situations on a cotton etc.  Developed fairly severe pain in the lumbar and thoracic area.  Worse with motions.  Sudden severe pains.  At times can almost take her breath away.  Is tried over-the-counter ibuprofen and local therapy heat and ice Review of Systems No fever no headache no cough no chest pain    Objective:   Physical Exam  Virtual      Assessment & Plan:  Impression thoracic/lumbar strain.  Petra Kuba of injury described.  Symptom care discussed local  measures discussed.  Prescription anti-inflammatory plus prescription muscle spasm medicine given expect slow resolution

## 2019-10-22 ENCOUNTER — Encounter: Payer: Self-pay | Admitting: Family Medicine

## 2019-10-27 ENCOUNTER — Encounter: Payer: Self-pay | Admitting: Family Medicine

## 2019-11-01 ENCOUNTER — Encounter: Payer: Self-pay | Admitting: Family Medicine

## 2019-11-02 MED ORDER — LEVOFLOXACIN 500 MG PO TABS: ORAL_TABLET | ORAL | 0 refills | Status: DC

## 2019-11-02 NOTE — Telephone Encounter (Signed)
Nurses Please let patient know I saw her pictures and her update I would recommend OTC Naprosyn 220 mg Take 2 pills twice daily over the next 5 to 7 days Also Levaquin 500 mg 1 daily for 5 days I would recommend a follow-up office visit if ongoing troubles Possibility of having to get dermatology or podiatry involved depending on how patient doubts

## 2019-11-02 NOTE — Addendum Note (Signed)
Addended by: Vicente Males on: 11/02/2019 11:57 AM   Modules accepted: Orders

## 2019-11-02 NOTE — Addendum Note (Signed)
Addended by: Vicente Males on: 11/02/2019 11:01 AM   Modules accepted: Orders

## 2019-11-04 ENCOUNTER — Encounter: Payer: Self-pay | Admitting: Family Medicine

## 2019-11-12 ENCOUNTER — Encounter: Payer: Self-pay | Admitting: Family Medicine

## 2019-11-12 NOTE — Telephone Encounter (Signed)
Nurses Please let [name] know that we appreciate the update on bill.  The weight is slightly down compared to where it has been I would recommend weighing him every 2 to 3 weeks and keeping a ongoing list.  Either send [place] a written out list regarding this or [facility] can establish a MyChart link for [name] so that future updates could be sent in on his chart (The system does not like mixing patients on individuals medical record) Thanks-Dr. Nicki Reaper

## 2020-01-13 ENCOUNTER — Encounter: Payer: Self-pay | Admitting: Family Medicine

## 2020-01-14 ENCOUNTER — Encounter: Payer: Self-pay | Admitting: Family Medicine

## 2020-01-23 ENCOUNTER — Encounter: Payer: Self-pay | Admitting: Family Medicine

## 2020-02-11 ENCOUNTER — Other Ambulatory Visit: Payer: Self-pay | Admitting: Family Medicine

## 2020-02-11 NOTE — Telephone Encounter (Signed)
Pt calling checking to see if medication has been refilled she is going out of town.

## 2020-02-20 ENCOUNTER — Encounter: Payer: Self-pay | Admitting: Family Medicine

## 2020-02-21 ENCOUNTER — Encounter: Payer: Self-pay | Admitting: Family Medicine

## 2020-02-23 NOTE — Telephone Encounter (Signed)
Nurses I am not aware of anyone seeking this type of employment currently. They may want to check with hospice to see if they have a list of caretakers that are looking for work Nurses-if you are aware of any reliable person for this potential work please connect with Langley Gauss regarding this  Thanks

## 2020-03-14 ENCOUNTER — Encounter: Payer: Self-pay | Admitting: Family Medicine

## 2020-03-14 ENCOUNTER — Other Ambulatory Visit: Payer: Self-pay | Admitting: Family Medicine

## 2020-03-14 MED ORDER — CEPHALEXIN 500 MG PO CAPS: 500.0000 mg | ORAL_CAPSULE | Freq: Four times a day (QID) | ORAL | 0 refills | Status: DC

## 2020-03-15 ENCOUNTER — Encounter: Payer: Self-pay | Admitting: Family Medicine

## 2020-03-15 ENCOUNTER — Other Ambulatory Visit: Payer: Self-pay

## 2020-03-15 ENCOUNTER — Ambulatory Visit: Payer: BC Managed Care – PPO | Admitting: Family Medicine

## 2020-03-15 VITALS — BP 112/82 | Temp 98.4°F

## 2020-03-15 DIAGNOSIS — R300 Dysuria: Secondary | ICD-10-CM

## 2020-03-15 NOTE — Progress Notes (Signed)
° °  Subjective:    Patient ID: [name], female    DOB: [birth year], 59 y.o.   MRN: [id number]  Urinary Tract Infection  This is a new problem. The current episode started yesterday. Associated symptoms include chills, frequency and hematuria. Associated symptoms comments: Cloudy urine, odor and pain with urination. She has tried acetaminophen (AZO) for the symptoms.   Patient did see streaks of blood in her urine and when she wiped   Review of Systems  Constitutional: Positive for chills. Negative for fatigue.  Respiratory: Negative for cough and shortness of breath.   Genitourinary: Positive for dysuria, frequency and hematuria.       Objective:   Physical Exam  Lungs clear respiratory rate normal heart regular flanks nontender abdomen soft  Urinalysis with WBCs some RBCs consistent with infection    Assessment & Plan:  UTI-culture pending Keflex 5 days Warning signs discussed Repeat urine in 3 to 4 weeks with urinalysis and urine culture If current culture is negative referral to urology

## 2020-03-17 LAB — URINE CULTURE

## 2020-03-22 ENCOUNTER — Encounter: Payer: Self-pay | Admitting: Family Medicine

## 2020-04-04 ENCOUNTER — Telehealth (INDEPENDENT_AMBULATORY_CARE_PROVIDER_SITE_OTHER): Payer: BC Managed Care – PPO | Admitting: *Deleted

## 2020-04-04 ENCOUNTER — Encounter: Payer: Self-pay | Admitting: *Deleted

## 2020-04-04 DIAGNOSIS — R300 Dysuria: Secondary | ICD-10-CM

## 2020-04-04 LAB — POCT URINALYSIS DIPSTICK
Spec Grav, UA: 1.015 (ref 1.010–1.025)
pH, UA: 6 (ref 5.0–8.0)

## 2020-04-04 NOTE — Telephone Encounter (Signed)
Urine dip was negative no sign of blood.  Await culture.  Please send patient my chart message letting her know urine dip and microscope was negative and we are awaiting the urine culture

## 2020-04-04 NOTE — Telephone Encounter (Signed)
Urine dip was normal and urine is spun and ready- culture sent to lab

## 2020-04-04 NOTE — Telephone Encounter (Signed)
Pt dropped off urine specimen to follow up on UTI. States she is not having any symptoms now just wanted to make sure infection was cleared up.

## 2020-04-07 ENCOUNTER — Other Ambulatory Visit: Payer: Self-pay | Admitting: *Deleted

## 2020-04-07 MED ORDER — PENICILLIN V POTASSIUM 500 MG PO TABS: 500.0000 mg | ORAL_TABLET | Freq: Four times a day (QID) | ORAL | 0 refills | Status: AC

## 2020-04-09 LAB — URINE CULTURE

## 2020-05-04 ENCOUNTER — Other Ambulatory Visit: Payer: Self-pay | Admitting: *Deleted

## 2020-05-04 ENCOUNTER — Telehealth (INDEPENDENT_AMBULATORY_CARE_PROVIDER_SITE_OTHER): Payer: BC Managed Care – PPO | Admitting: *Deleted

## 2020-05-04 DIAGNOSIS — R300 Dysuria: Secondary | ICD-10-CM

## 2020-05-04 LAB — POCT URINALYSIS DIPSTICK (MANUAL)
Leukocytes, UA: NEGATIVE
Nitrite, UA: NEGATIVE
Poct Bilirubin: NEGATIVE
Poct Blood: NEGATIVE
Poct Glucose: NORMAL mg/dL
Poct Ketones: NEGATIVE
Poct Protein: NEGATIVE mg/dL
Spec Grav, UA: 1.02 (ref 1.010–1.025)
pH, UA: 6 (ref 5.0–8.0)

## 2020-05-04 NOTE — Telephone Encounter (Signed)
Patient came by to leave a urine specimen for recheck. Dip was negative and urine was spun and left at nurses station. Results for orders placed or performed in visit on 05/04/20  POCT Urinalysis Dip Manual  Result Value Ref Range   Spec Grav, UA 1.020 1.010 - 1.025   pH, UA 6.0 5.0 - 8.0   Leukocytes, UA Negative Negative   Nitrite, UA Negative Negative   Poct Protein Negative Negative, trace mg/dL   Poct Glucose Normal Normal mg/dL   Poct Ketones Negative Negative   Poct Urobilinogen     Poct Bilirubin Negative Negative   Poct Blood Negative Negative, trace

## 2020-05-04 NOTE — Telephone Encounter (Signed)
Please send urine for culture.

## 2020-05-04 NOTE — Telephone Encounter (Signed)
Urine sent for culture and pt is aware.

## 2020-05-06 LAB — URINE CULTURE

## 2020-05-06 LAB — SPECIMEN STATUS REPORT

## 2020-05-19 ENCOUNTER — Ambulatory Visit: Payer: BC Managed Care – PPO | Admitting: Family Medicine

## 2020-05-19 ENCOUNTER — Encounter: Payer: Self-pay | Admitting: Family Medicine

## 2020-05-19 ENCOUNTER — Other Ambulatory Visit: Payer: Self-pay

## 2020-05-19 VITALS — BP 134/86 | HR 111 | Temp 98.0°F | Ht 68.0 in

## 2020-05-19 DIAGNOSIS — L089 Local infection of the skin and subcutaneous tissue, unspecified: Secondary | ICD-10-CM | POA: Diagnosis not present

## 2020-05-19 DIAGNOSIS — B9689 Other specified bacterial agents as the cause of diseases classified elsewhere: Secondary | ICD-10-CM | POA: Diagnosis not present

## 2020-05-19 MED ORDER — DOXYCYCLINE HYCLATE 100 MG PO TABS
100.0000 mg | ORAL_TABLET | Freq: Two times a day (BID) | ORAL | 0 refills | Status: DC
Start: 2020-05-19 — End: 2020-06-15

## 2020-05-19 NOTE — Progress Notes (Signed)
   Patient ID: [name], female    DOB: 10-03-1961, 59 y.o.   MRN: [id number]   Chief Complaint  Patient presents with  . med check up   Subjective:    HPI  med check up and insect bite to be checked out.  Pt wants refill on elocon cream.  Blister on left heel.  Rash on left side for 2 weeks.    Medical History Kayse has a past medical history of Aneurysm ([place]) and Normal spontaneous vaginal delivery.   Outpatient Encounter Medications as of 05/19/2020  Medication Sig  . albuterol (PROVENTIL HFA;VENTOLIN HFA) 108 (90 Base) MCG/ACT inhaler Inhale 2 puffs into the lungs every 6 (six) hours as needed for wheezing or shortness of breath. (Patient not taking: Reported on 08/17/2019)  . cephALEXin (KEFLEX) 500 MG capsule Take 1 capsule (500 mg total) by mouth 4 (four) times daily.  . citalopram (CELEXA) 20 MG tablet TAKE ONE TABLET (20MG ) BY MOUTH DAILY  . etodolac (LODINE) 400 MG tablet Take 1 tablet (400 mg total) by mouth 2 (two) times daily as needed (pain). With food (Patient not taking: Reported on 03/15/2020)  . levofloxacin (LEVAQUIN) 500 MG tablet Take one tablet po daily for 5 days  . mometasone (ELOCON) 0.1 % cream Apply to affected area twice daily for up to 2 weeks  . tiZANidine (ZANAFLEX) 4 MG tablet Take 1 tablet (4 mg total) by mouth 3 (three) times daily as needed for muscle spasms. (Patient not taking: Reported on 03/15/2020)   No facility-administered encounter medications on file as of 05/19/2020.     Review of Systems  Constitutional: Negative for chills, fatigue and fever.  Skin: Positive for rash.       Left side with  "bite mark" from 2-3 weeks ago. Blister on left heel healing from ill fitting shoe.     Vitals BP 134/86   Pulse (!) 111   Temp 98 F (36.7 C)   Ht 5\' 8"  (1.727 m)   SpO2 98%   BMI 29.07 kg/m   Objective:   Physical Exam Constitutional:      Appearance: Normal appearance.  Cardiovascular:     Rate and Rhythm: Normal rate and  regular rhythm.  Pulmonary:     Effort: Pulmonary effort is normal.     Breath sounds: Normal breath sounds.  Skin:    General: Skin is warm and dry.     Comments: Left lower abdomen with what looks like an insect bite. Concern for local skin infection, as there is some erythema around the site. There is also an old blister on the bottom of her left heel that she is concerned about. No drainage or redness.  Neurological:     Mental Status: She is alert.      Assessment and Plan   1. Localized bacterial skin infection   Doxycyline 100 mg by mouth two times per day for 10 days given.  Agrees with plan of care and understands to return if symptoms do not improve.   Pecolia Ades, NP  05/19/2020

## 2020-05-19 NOTE — Patient Instructions (Signed)
I think something bit you. Take Doxycycline as instructed.

## 2020-06-02 ENCOUNTER — Other Ambulatory Visit: Payer: Self-pay | Admitting: Family Medicine

## 2020-06-15 ENCOUNTER — Other Ambulatory Visit: Payer: Self-pay

## 2020-06-15 ENCOUNTER — Ambulatory Visit: Payer: BC Managed Care – PPO | Admitting: Family Medicine

## 2020-06-15 ENCOUNTER — Encounter: Payer: Self-pay | Admitting: Family Medicine

## 2020-06-15 VITALS — BP 118/76 | HR 92 | Temp 98.4°F | Ht 68.0 in

## 2020-06-15 DIAGNOSIS — M79602 Pain in left arm: Secondary | ICD-10-CM

## 2020-06-15 NOTE — Patient Instructions (Signed)
Continue using ibuprofen and asper Cream for your arm pain. If your symptoms worsen: shortness of breath, sweating, more arm pain not relieved: Go to ED Get labs drawn tomorrow morning and we will discuss results.   Lipid Profile Test Why am I having this test? The lipid profile test can be used to help evaluate your risk for developing heart disease. The test is also used to monitor your levels during treatment for high cholesterol to see if you are reaching your goals. What is being tested? A lipid profile measures the following:  Total cholesterol. Cholesterol is a waxy, fat-like substance in your blood. If your total cholesterol level is high, this can increase your risk for heart disease.  High-density lipoprotein (HDL). This is known as the good cholesterol. Having high levels of HDL decreases your risk for heart disease. Your HDL level may be low if you smoke or do not get enough exercise.  Low-density lipoprotein (LDL). This is known as the bad cholesterol. This type causes plaque to build up in your arteries. Having a low level of LDL is best. Having high levels of LDL increases your risk for heart disease.  Cholesterol to HDL ratio. This is calculated by dividing your total cholesterol by your HDL cholesterol. The ratio is used by health care providers to determine your risk for heart disease. A low ratio is best.  Triglycerides. These are fats that your body can store or burn for energy. Low levels are best. Having high levels of triglycerides increases your risk for heart disease. What kind of sample is taken?  A blood sample is required for this test. It is usually collected by inserting a needle into a blood vessel. How do I prepare for this test? Do not drink alcohol starting at least 24 hours before your test. Follow any instructions from your health care provider about dietary restrictions before your test. Do not eat or drink anything other than water after midnight on the  night before the test, or as told by your health care provider. Tell a health care provider about:  All medicines you are taking, including vitamins, herbs, eye drops, creams, and over-the-counter medicines.  Any medical conditions you have.  Whether you are pregnant or may be pregnant. How are the results reported? Your test results will be reported as values that indicate your cholesterol and triglyceride levels. Your health care provider will compare your results to normal ranges that were established after testing a large group of people (reference ranges). Reference ranges may vary among labs and hospitals. For this test, common reference ranges are: Total cholesterol  Adult or elderly: less than 200 mg/dL.  Child: 120-200 mg/dL.  Infant: 70-175 mg/dL.  Newborn: 53-135 mg/dL. HDL  Female: greater than 45 mg/dL.  Female: greater than 55 mg/dL. HDL reference values based on your risk for heart disease:  Low risk for heart disease: ? Female: 60 mg/dL. ? Female: 70 mg/dL.  Moderate risk for heart disease: ? Female: 45 mg/dL. ? Female: 55 mg/dL.  High risk for heart disease: ? Female: 25 mg/dL. ? Female: 35 mg/dL. LDL  Adults: Your health care provider will determine a target level for LDL based on your risk for heart disease. ? If you are at low risk, your LDL should be 130 mg/dL or less. ? If you are at moderate risk, your LDL should be 100 mg/dL or less. ? If you are at high risk, your LDL should be 70 mg/dL or less.  Children:  less than 110 mg/dL. Cholesterol to HDL ratio Reference values based on your risk for heart disease:  Risk that is half the average risk: ? Female: 3.4. ? Female: 3.3.  Average risk: ? Female: 5.0. ? Female: 4.4.  Risk that is two times average (moderate risk): ? Female: 10.0. ? Female: 7.0.  Risk that is three times average (high risk): ? Female: 24.0. ? Female: 11.0. Triglycerides  Adult or elderly: ? Female: 40-160 mg/dL. ? Female: 35-135  mg/dL.  Children 55-25 years old: ? Female: 40-163 mg/dL. ? Female: 40-128 mg/dL.  Children 98-21 years old: ? Female: 36-138 mg/dL. ? Female: 41-138 mg/dL.  Children 76-2 years old: ? Female: 31-108 mg/dL. ? Female: 35-114 mg/dL.  Children 79-75 years old: ? Female: 30-86 mg/dL. ? Female: 32-99 mg/dL. Triglycerides should be less than 400 mg/dL even when you are not fasting. What do the results mean? Results that are within the reference ranges are considered normal. Total cholesterol, LDL, and triglyceride levels that are higher than the reference ranges can mean that you have an increased risk for heart disease. An HDL level that is lower than the reference range can also indicate an increased risk. Talk with your health care provider about what your results mean. Questions to ask your health care provider Ask your health care provider, or the department that is doing the test:  When will my results be ready?  How will I get my results?  What are my treatment options?  What other tests do I need?  What are my next steps? Summary  The lipid profile test can be used to help predict the likelihood that you will develop heart disease. It can also help monitor your cholesterol levels during treatment.  A lipid profile measures your total cholesterol, high-density lipoprotein (HDL), low-density lipoprotein (LDL), cholesterol to HDL ratio, and triglycerides.  Total cholesterol, LDL, and triglyceride levels that are higher than the reference ranges can indicate an increased risk for heart disease.  An HDL level that is lower than the reference range can indicate an increased risk for heart disease.  Talk with your health care provider about what your results mean. This information is not intended to replace advice given to you by your health care provider. Make sure you discuss any questions you have with your health care provider. Document Revised: 06/30/2017 Document Reviewed:  06/30/2017 Elsevier Patient Education  2020 [employer].

## 2020-06-15 NOTE — Progress Notes (Signed)
Patient ID: [name], female    DOB: Dec 15, 1960, 59 y.o.   MRN: [id number]   Chief Complaint  Patient presents with   Shoulder Pain   Subjective:    HPIleft shoulder pain for a few weeks. Tried ibuprofen and aspercream.    Medical History [name] has a past medical history of Aneurysm ([place]) and Normal spontaneous vaginal delivery.   Outpatient Encounter Medications as of 06/15/2020  Medication Sig   citalopram (CELEXA) 20 MG tablet TAKE ONE TABLET (20MG ) BY MOUTH DAILY   [DISCONTINUED] albuterol (PROVENTIL HFA;VENTOLIN HFA) 108 (90 Base) MCG/ACT inhaler Inhale 2 puffs into the lungs every 6 (six) hours as needed for wheezing or shortness of breath. (Patient not taking: Reported on 08/17/2019)   [DISCONTINUED] cephALEXin (KEFLEX) 500 MG capsule Take 1 capsule (500 mg total) by mouth 4 (four) times daily.   [DISCONTINUED] doxycycline (VIBRA-TABS) 100 MG tablet Take 1 tablet (100 mg total) by mouth 2 (two) times daily.   [DISCONTINUED] etodolac (LODINE) 400 MG tablet Take 1 tablet (400 mg total) by mouth 2 (two) times daily as needed (pain). With food (Patient not taking: Reported on 03/15/2020)   [DISCONTINUED] levofloxacin (LEVAQUIN) 500 MG tablet Take one tablet po daily for 5 days   [DISCONTINUED] mometasone (ELOCON) 0.1 % cream Apply to affected area twice daily for up to 2 weeks   [DISCONTINUED] tiZANidine (ZANAFLEX) 4 MG tablet Take 1 tablet (4 mg total) by mouth 3 (three) times daily as needed for muscle spasms. (Patient not taking: Reported on 03/15/2020)   No facility-administered encounter medications on file as of 06/15/2020.     Review of Systems  Constitutional: Negative for chills, diaphoresis and fever.  HENT: Negative.   Respiratory: Positive for shortness of breath. Negative for cough and chest tightness.        Described as a "short breath" when sitting still.  Cardiovascular:       Left arm achy.   Gastrointestinal: Negative.   Endocrine: Negative.     Genitourinary: Negative.   Musculoskeletal:       Left arm aches.  Skin: Negative.      Vitals BP 118/76    Pulse 92    Temp 98.4 F (36.9 C)    Ht 5\' 8"  (1.727 m)    SpO2 99%    BMI 29.07 kg/m   Objective:   Physical Exam Vitals and nursing note reviewed.  Constitutional:      Appearance: Normal appearance.  Eyes:     Extraocular Movements: Extraocular movements intact.     Pupils: Pupils are equal, round, and reactive to light.  Cardiovascular:     Rate and Rhythm: Normal rate and regular rhythm.     Heart sounds: Normal heart sounds.  Pulmonary:     Effort: Pulmonary effort is normal.     Breath sounds: Normal breath sounds.  Skin:    General: Skin is warm and dry.  Neurological:     Mental Status: She is alert and oriented to person, place, and time.  Psychiatric:        Mood and Affect: Mood normal.        Behavior: Behavior normal.      Assessment and Plan   1. Pain of left upper extremity - CBC With Differential - Comprehensive Metabolic Panel (CMET) - Lipid Profile - TSH   [name] presents today after a very stressful situation helping her father pass away peacefully. She admits to neglecting her health care and is now  ready to get back on track.  She has noticed her left arm is achy. Denies shortness of breath and chest pain or tightness.   We will do an EKG today and she will get her labs drawn in the morning. EKG reviewed with Langley Gauss- normal, no ST elevation or ectopy noted.    Her detailed neuro exam is negative today. She has started a walking routine and was able to walk 1-1.5 miles without shortness of breath or chest pain.   Agrees with plan of care discussed today. Understands warning signs to seek further care: chest pain, shortness of breath, sweating.  Understands we will notify her once lab results are available and to discuss next steps. She will continue to use ibuprofen and asper cream for left arm ache.    Pecolia Ades,  FNP-C 06/15/2020

## 2020-06-17 ENCOUNTER — Encounter: Payer: Self-pay | Admitting: Family Medicine

## 2020-06-17 LAB — LIPID PANEL
Chol/HDL Ratio: 5.5 ratio — ABNORMAL HIGH (ref 0.0–4.4)
Cholesterol, Total: 255 mg/dL — ABNORMAL HIGH (ref 100–199)
HDL: 46 mg/dL (ref >39)
LDL Chol Calc (NIH): 172 mg/dL — ABNORMAL HIGH (ref 0–99)
Triglycerides: 197 mg/dL — ABNORMAL HIGH (ref 0–149)
VLDL Cholesterol Cal: 37 mg/dL (ref 5–40)

## 2020-06-17 LAB — TSH: TSH: 1.41 u[IU]/mL (ref 0.450–4.500)

## 2020-06-17 LAB — CBC WITH DIFFERENTIAL
Basophils Absolute: 0 10*3/uL (ref 0.0–0.2)
Basos: 1 %
EOS (ABSOLUTE): 0.3 10*3/uL (ref 0.0–0.4)
Eos: 5 %
Hematocrit: 43.5 % (ref 34.0–46.6)
Hemoglobin: 14.2 g/dL (ref 11.1–15.9)
Immature Grans (Abs): 0 10*3/uL (ref 0.0–0.1)
Immature Granulocytes: 0 %
Lymphocytes Absolute: 2.3 10*3/uL (ref 0.7–3.1)
Lymphs: 39 %
MCH: 28.8 pg (ref 26.6–33.0)
MCHC: 32.6 g/dL (ref 31.5–35.7)
MCV: 88 fL (ref 79–97)
Monocytes Absolute: 0.4 10*3/uL (ref 0.1–0.9)
Monocytes: 7 %
Neutrophils Absolute: 2.8 10*3/uL (ref 1.4–7.0)
Neutrophils: 48 %
RBC: 4.93 x10E6/uL (ref 3.77–5.28)
RDW: 13 % (ref 11.7–15.4)
WBC: 5.9 10*3/uL (ref 3.4–10.8)

## 2020-06-17 LAB — COMPREHENSIVE METABOLIC PANEL
ALT: 27 IU/L (ref 0–32)
AST: 20 IU/L (ref 0–40)
Albumin/Globulin Ratio: 2 (ref 1.2–2.2)
Albumin: 4.7 g/dL (ref 3.8–4.9)
Alkaline Phosphatase: 66 IU/L (ref 48–121)
BUN/Creatinine Ratio: 16 (ref 9–23)
BUN: 11 mg/dL (ref 6–24)
Bilirubin Total: 0.5 mg/dL (ref 0.0–1.2)
CO2: 24 mmol/L (ref 20–29)
Calcium: 9.7 mg/dL (ref 8.7–10.2)
Chloride: 103 mmol/L (ref 96–106)
Creatinine, Ser: 0.67 mg/dL (ref 0.57–1.00)
GFR calc Af Amer: 113 mL/min/{1.73_m2} (ref >59)
GFR calc non Af Amer: 98 mL/min/{1.73_m2} (ref >59)
Globulin, Total: 2.4 g/dL (ref 1.5–4.5)
Glucose: 99 mg/dL (ref 65–99)
Potassium: 4.4 mmol/L (ref 3.5–5.2)
Sodium: 140 mmol/L (ref 134–144)
Total Protein: 7.1 g/dL (ref 6.0–8.5)

## 2020-06-19 ENCOUNTER — Telehealth: Payer: Self-pay | Admitting: *Deleted

## 2020-06-19 ENCOUNTER — Other Ambulatory Visit: Payer: Self-pay | Admitting: Family Medicine

## 2020-06-19 ENCOUNTER — Telehealth: Payer: Self-pay | Admitting: Family Medicine

## 2020-06-19 DIAGNOSIS — M79602 Pain in left arm: Secondary | ICD-10-CM

## 2020-06-19 MED ORDER — MELOXICAM 7.5 MG PO TABS: 7.5000 mg | ORAL_TABLET | Freq: Every day | ORAL | 0 refills | Status: DC

## 2020-06-19 NOTE — Telephone Encounter (Signed)
Pharmacist at [facility] called to report potential interaction between Meloxicam that was just sent in and her citalopram and want to make sure you still would like to have the prescription filled. They are requesting a call back to see what they should do  [place]

## 2020-06-19 NOTE — Telephone Encounter (Signed)
Pt is going tomorrow to hospital lab to have trop I done. She wanted to know if an anti inflammatory could be called in for her arm pain.  [name] pharm

## 2020-06-19 NOTE — Telephone Encounter (Signed)
Patient was seen 9/9 for arm pain and would like something called into [facility]

## 2020-06-19 NOTE — Telephone Encounter (Signed)
Andy from [facility] called to say meloxicam interacts with citalopram

## 2020-06-19 NOTE — Addendum Note (Signed)
Addended by: Carmelina Noun on: 06/19/2020 02:33 PM   Modules accepted: Orders

## 2020-06-19 NOTE — Telephone Encounter (Signed)
I sent over Mobic for arm pain. Please advise to take with food- NSAIDS increase risk of GI bleeding. Let me know if it is working. Santiago Glad

## 2020-06-20 ENCOUNTER — Other Ambulatory Visit (HOSPITAL_COMMUNITY)
Admission: RE | Admit: 2020-06-20 | Discharge: 2020-06-20 | Disposition: A | Discharge status: Home / Self Care | Payer: BC Managed Care – PPO | Source: Ambulatory Visit | Attending: Family Medicine | Admitting: Family Medicine

## 2020-06-20 ENCOUNTER — Telehealth: Payer: Self-pay | Admitting: *Deleted

## 2020-06-20 DIAGNOSIS — M79602 Pain in left arm: Secondary | ICD-10-CM | POA: Insufficient documentation

## 2020-06-20 LAB — TROPONIN I (HIGH SENSITIVITY): Troponin I (High Sensitivity): <2 ng/L (ref <18)

## 2020-06-20 NOTE — Telephone Encounter (Signed)
Santiago Glad NP spoke with pharmacist and patient regarding this message

## 2020-06-20 NOTE — Telephone Encounter (Signed)
Nurses, Please notify [facility] this test is negative, which is what I expected.  If the meloxicam is not effective for her left arm ache, we may need to refer her to ortho.  Thanks, Santiago Glad

## 2020-06-20 NOTE — Telephone Encounter (Signed)
Stat bw results ready for review in epic

## 2020-06-20 NOTE — Telephone Encounter (Signed)
Discussed with pt and pt verbalized understanding.  °

## 2020-06-22 ENCOUNTER — Encounter: Payer: Self-pay | Admitting: Family Medicine

## 2020-06-22 IMAGING — US RIGHT LOWER EXTREMITY VENOUS ULTRASOUND
1 series · 13 of 24 positions shown · non-contrast
Comparison: Right lower extremity venous Doppler
ultrasound-11/03/2018

CLINICAL DATA: Right lower extremity pain and swelling for the past
week. Elevated D-dimer. Evaluate for DVT.



[Series 1: right lower extremity venous ultrasound · 0.08mm/px · 13 of 35 slices shown]
[im 1/35]
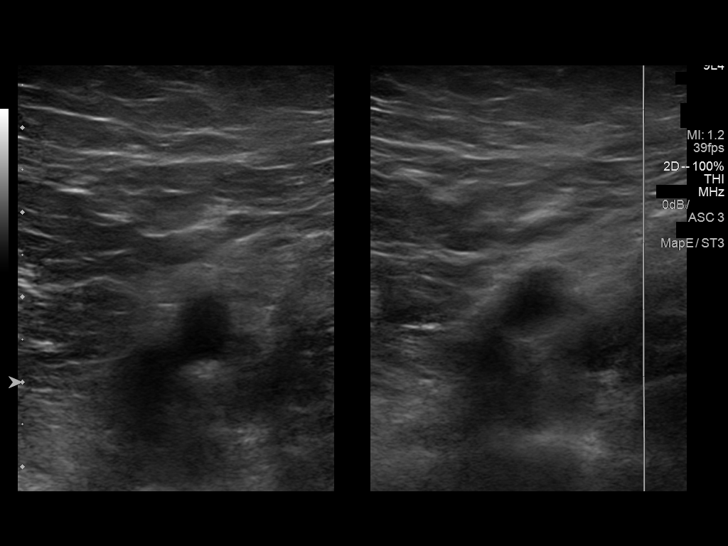
[im 3/35]
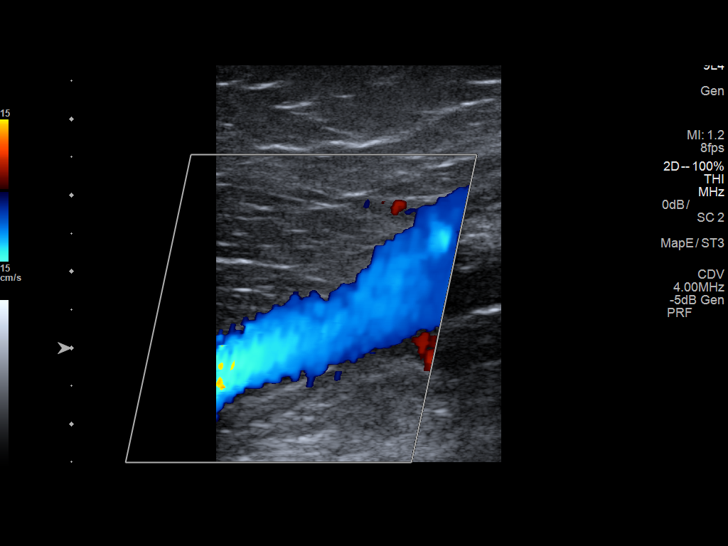
[im 6/35]
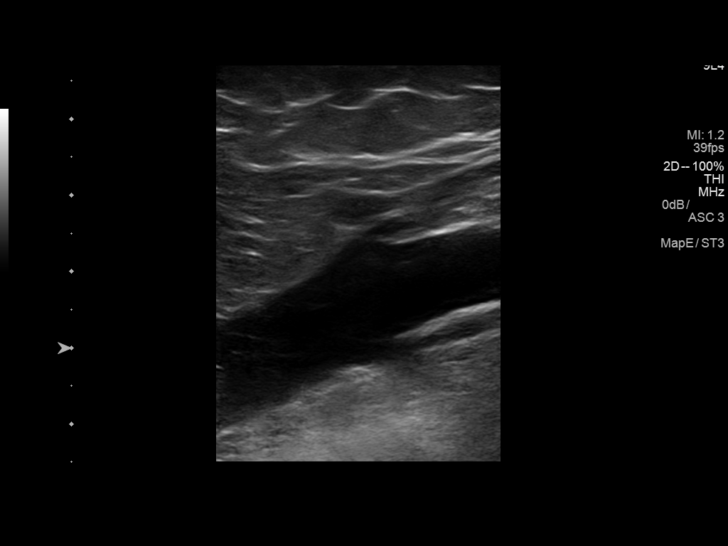
[im 9/35]
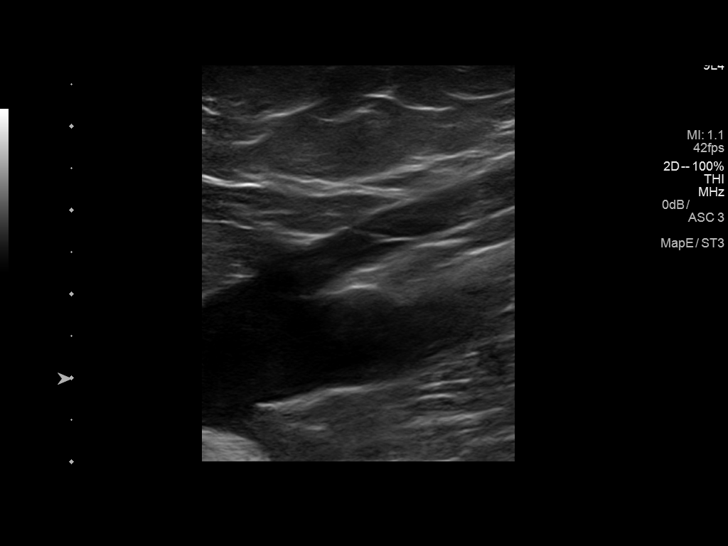
[im 12/35]
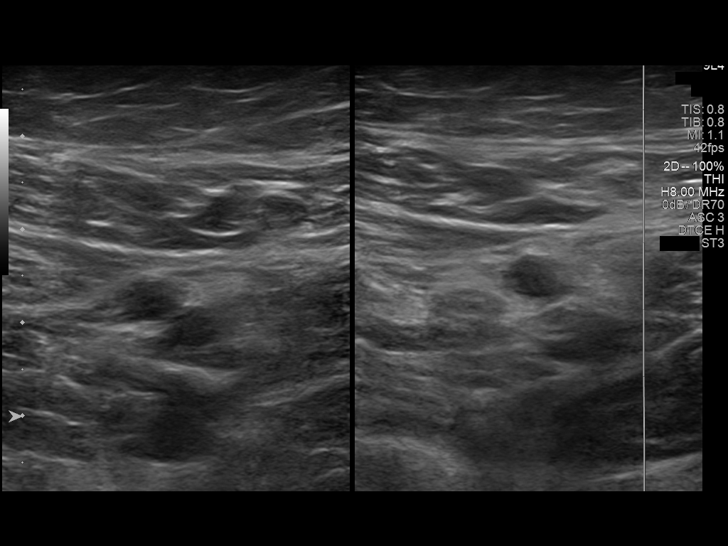
[im 15/35]
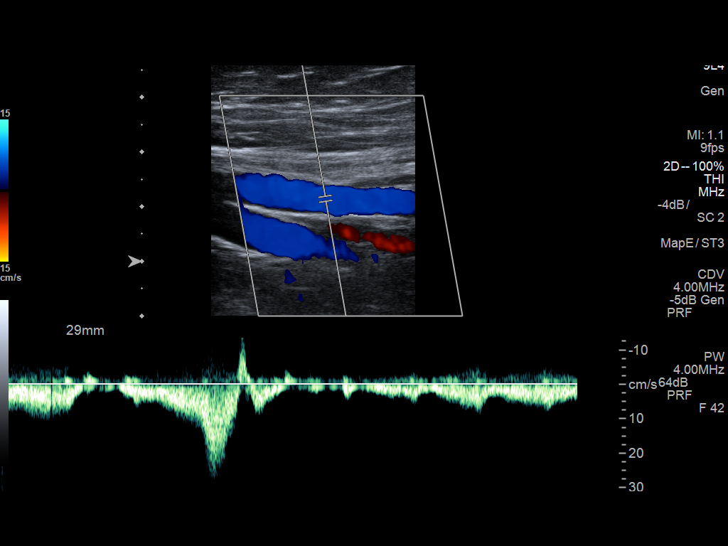
[im 18/35]
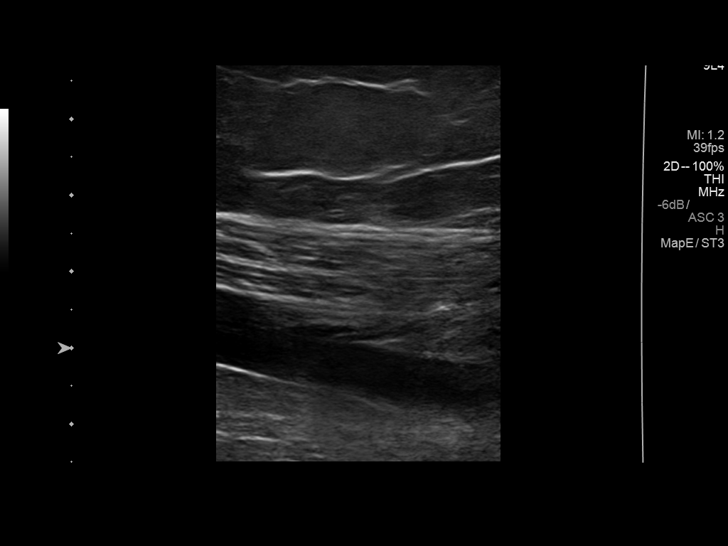
[im 20/35]
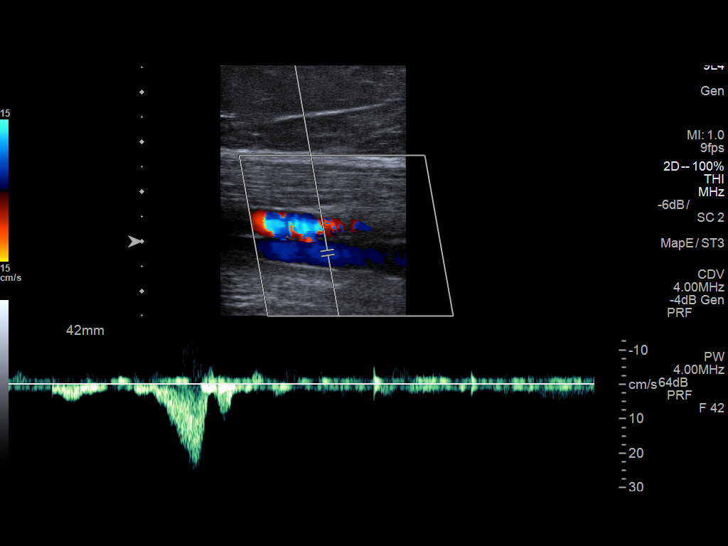
[im 23/35]
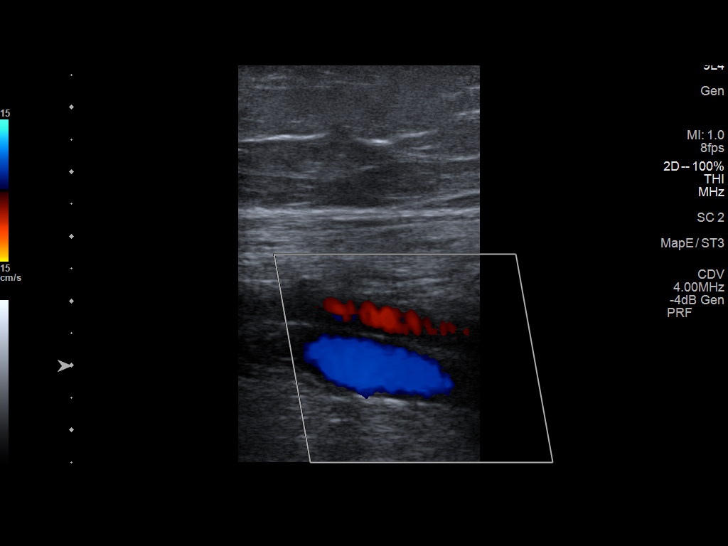
[im 26/35]
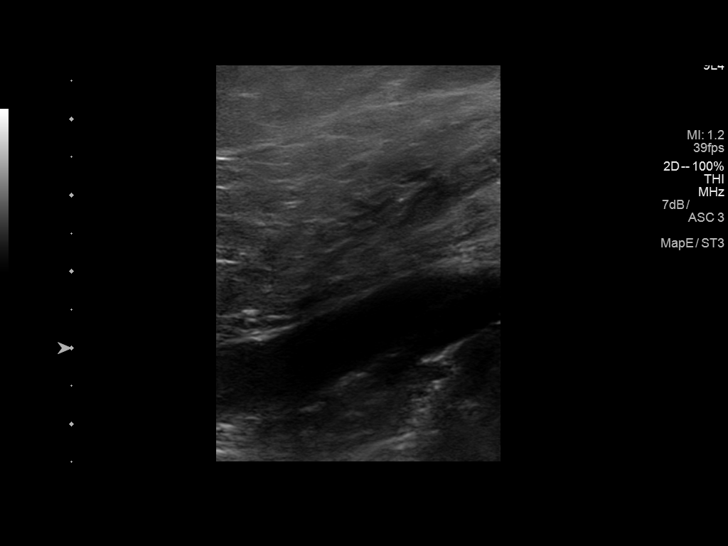
[im 29/35]
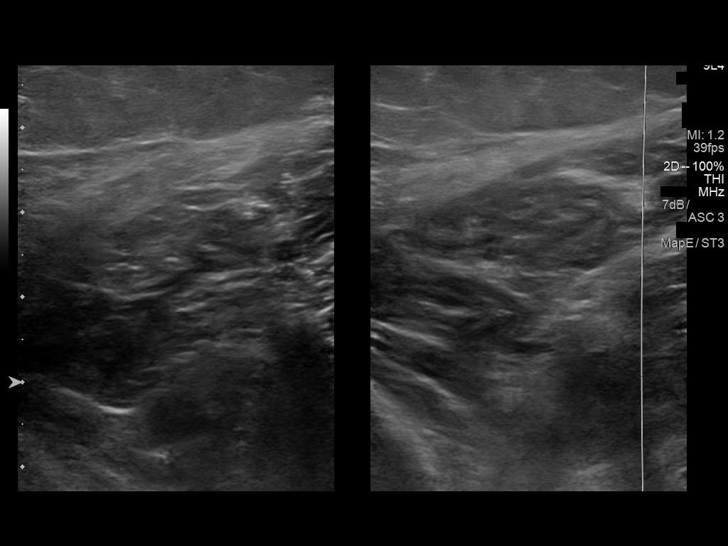
[im 32/35]
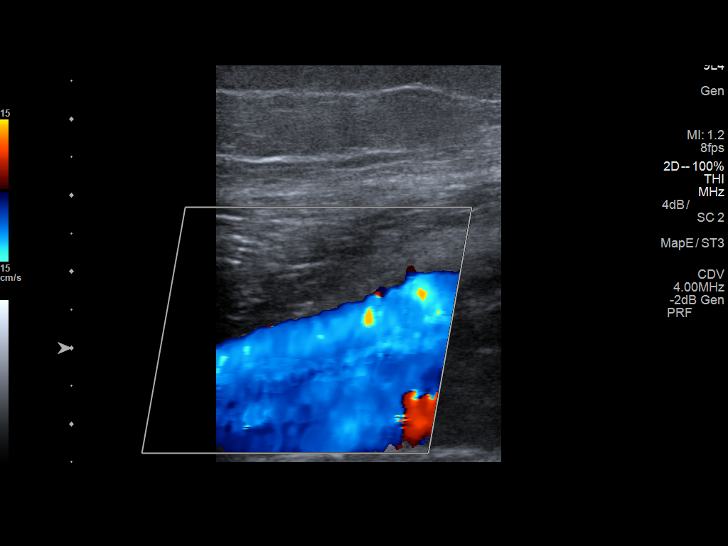
[im 35/35]
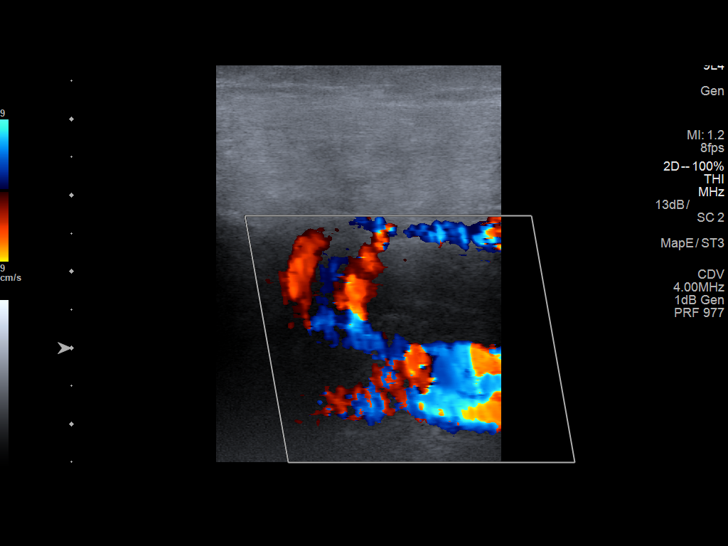

[13 of 24 positions shown; findings below may reference images not displayed]

FINDINGS: Contralateral Common Femoral Vein: Respiratory phasicity is normal
and symmetric with the symptomatic side. No evidence of thrombus.
Normal compressibility.

Common Femoral Vein: No evidence of thrombus. Normal
compressibility, respiratory phasicity and response to augmentation.

Saphenofemoral Junction: No evidence of thrombus. Normal
compressibility and flow on color Doppler imaging.

Profunda Femoral Vein: No evidence of thrombus. Normal
compressibility and flow on color Doppler imaging.

Femoral Vein: No evidence of thrombus. Normal compressibility,
respiratory phasicity and response to augmentation.

Popliteal Vein: No evidence of thrombus. Normal compressibility,
respiratory phasicity and response to augmentation.

Calf Veins: No evidence of thrombus. Normal compressibility and flow
on color Doppler imaging.

Superficial Great Saphenous Vein: No evidence of thrombus. Normal
compressibility.

Venous Reflux:  None.

Other Findings:  None.
IMPRESSION: No evidence of DVT within the right lower extremity.

## 2020-06-23 ENCOUNTER — Encounter: Payer: Self-pay | Admitting: Nurse Practitioner

## 2020-06-23 NOTE — Telephone Encounter (Signed)
I called [name] and put a phone message in [name]'s chart.

## 2020-06-30 ENCOUNTER — Encounter: Payer: Self-pay | Admitting: Nurse Practitioner

## 2020-07-21 ENCOUNTER — Ambulatory Visit (INDEPENDENT_AMBULATORY_CARE_PROVIDER_SITE_OTHER): Payer: BC Managed Care – PPO | Admitting: Nurse Practitioner

## 2020-07-21 ENCOUNTER — Encounter: Payer: Self-pay | Admitting: Nurse Practitioner

## 2020-07-21 VITALS — BP 118/84 | HR 85 | Temp 98.2°F | Ht 65.0 in | Wt 207.4 lb

## 2020-07-21 DIAGNOSIS — Z01419 Encounter for gynecological examination (general) (routine) without abnormal findings: Secondary | ICD-10-CM | POA: Diagnosis not present

## 2020-07-21 DIAGNOSIS — Z79899 Other long term (current) drug therapy: Secondary | ICD-10-CM | POA: Diagnosis not present

## 2020-07-21 DIAGNOSIS — E785 Hyperlipidemia, unspecified: Secondary | ICD-10-CM | POA: Diagnosis not present

## 2020-07-21 DIAGNOSIS — E782 Mixed hyperlipidemia: Secondary | ICD-10-CM | POA: Insufficient documentation

## 2020-07-21 MED ORDER — PHENTERMINE HCL 37.5 MG PO TABS: ORAL_TABLET | ORAL | 2 refills | Status: DC

## 2020-07-21 MED ORDER — ROSUVASTATIN CALCIUM 5 MG PO TABS: ORAL_TABLET | ORAL | 1 refills | Status: DC
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
## 2020-07-23 ENCOUNTER — Encounter: Payer: Self-pay | Admitting: Nurse Practitioner

## 2020-07-23 NOTE — Progress Notes (Signed)
   Subjective:    Patient ID: [name], female    DOB: 20-Jan-1961, 59 y.o.   MRN: [id number]  HPI    Review of Systems     Objective:   Physical Exam        Assessment & Plan:

## 2020-08-25 ENCOUNTER — Encounter: Payer: Self-pay | Admitting: Nurse Practitioner

## 2020-09-04 ENCOUNTER — Other Ambulatory Visit: Payer: Self-pay | Admitting: Family Medicine

## 2020-09-14 ENCOUNTER — Other Ambulatory Visit: Payer: Self-pay | Admitting: Nurse Practitioner

## 2020-09-20 ENCOUNTER — Other Ambulatory Visit: Payer: Self-pay | Admitting: Family Medicine

## 2020-09-20 DIAGNOSIS — Z1231 Encounter for screening mammogram for malignant neoplasm of breast: Secondary | ICD-10-CM

## 2020-10-27 ENCOUNTER — Ambulatory Visit (INDEPENDENT_AMBULATORY_CARE_PROVIDER_SITE_OTHER): Payer: BC Managed Care – PPO | Admitting: Nurse Practitioner

## 2020-10-27 ENCOUNTER — Other Ambulatory Visit: Payer: Self-pay

## 2020-10-27 ENCOUNTER — Encounter: Payer: Self-pay | Admitting: Nurse Practitioner

## 2020-10-27 VITALS — BP 128/88 | HR 100 | Temp 97.4°F | Wt 190.2 lb

## 2020-10-27 DIAGNOSIS — E785 Hyperlipidemia, unspecified: Secondary | ICD-10-CM | POA: Diagnosis not present

## 2020-10-27 MED ORDER — PHENTERMINE HCL 37.5 MG PO TABS: ORAL_TABLET | ORAL | 2 refills | Status: DC

## 2020-10-27 NOTE — Patient Instructions (Signed)
Qsymia Mediterranean Diet

## 2020-10-27 NOTE — Progress Notes (Signed)
   Subjective:    Patient ID: [name], female    DOB: September 18, 1961, 60 y.o.   MRN: [id number]  HPI Patient following up on hyperlipidemia and weight loss.  Requesting refill on adipex and 90 day on Rosuvastatin. No concerns.    Review of Systems     Objective:   Physical Exam        Assessment & Plan:

## 2020-10-27 NOTE — Progress Notes (Signed)
   Subjective:    Patient ID: AMBUR PROVINCE, female    DOB: 1961-03-16, 60 y.o.   MRN: [id number]  HPI Patient presents today for a follow-up of medications started in the fall of 2021, phentermine and rosuvastatin.  She is currently taking phentermine 37.5 mg QD and rosuvastatin one 5 mg tablet every Monday, Wednesday, and Friday.  Has lost 17 lbs since her last visit in October 2021.  She states she "feels really good".  Patient reports being generally more active, cleaning out her father's house on most days since he passed away last fall.  She has also purchased an elliptical and exercises on it occasionally.  She reports eating less food than she was prior to starting the phentermine.  Well balanced diet overall.  Patient is agreeable to have labs drawn for cholesterol follow-up today. Patient denies palpitations, shortness of breath, chest pain, numbness or tingling.    Review of Systems  Respiratory: Negative for cough, chest tightness and shortness of breath.   Cardiovascular: Negative for chest pain, palpitations and leg swelling.  Neurological: Negative for dizziness, syncope, light-headedness and numbness.  Psychiatric/Behavioral: Negative for agitation and sleep disturbance. The patient is not nervous/anxious.          Objective:   Physical Exam Cardiovascular:     Rate and Rhythm: Normal rate and regular rhythm.     Heart sounds: Normal heart sounds.  Pulmonary:     Breath sounds: Normal breath sounds.  Musculoskeletal:        General: No swelling.     Right lower leg: No edema.     Left lower leg: No edema.  Skin:    General: Skin is warm and dry.  Neurological:     Mental Status: She is alert.  Psychiatric:        Mood and Affect: Mood normal.        Behavior: Behavior normal.        Thought Content: Thought content normal.        Judgment: Judgment normal.         Assessment & Plan:   Problem List Items Addressed This Visit      Other   Hyperlipidemia  LDL goal <130 - Primary   Morbid obesity ([place])   Relevant Medications   phentermine (ADIPEX-P) 37.5 MG tablet     Meds ordered this encounter  Medications  . phentermine (ADIPEX-P) 37.5 MG tablet    Sig: 1 Tab PO QD on an empty stomach for weight loss    Dispense:  30 tablet    Refill:  2    Order Specific Question:   Supervising Provider    Answer:   Sallee Lange A [id number]     Patient would like to continue the phentermine for another 3 months with follow-up.  At that time we will reassess her weight loss needs and if needed place her on a different medication that is more appropriate for long-term use. Encouraged patient to research Mediterranean Diet to see if it could be an appropriate lifestyle change for her in addition to continuing the phentermine.  Will follow-up on pending labs.   Return in about 3 months (around 01/25/2021) for Weight loss.

## 2020-10-28 ENCOUNTER — Other Ambulatory Visit: Payer: Self-pay | Admitting: Nurse Practitioner

## 2020-10-28 LAB — LIPID PANEL
Chol/HDL Ratio: 3.5 ratio (ref 0.0–4.4)
Cholesterol, Total: 177 mg/dL (ref 100–199)
HDL: 50 mg/dL (ref >39)
LDL Chol Calc (NIH): 112 mg/dL — ABNORMAL HIGH (ref 0–99)
Triglycerides: 83 mg/dL (ref 0–149)
VLDL Cholesterol Cal: 15 mg/dL (ref 5–40)

## 2020-10-28 LAB — HEPATIC FUNCTION PANEL
ALT: 22 IU/L (ref 0–32)
AST: 20 IU/L (ref 0–40)
Albumin: 4.6 g/dL (ref 3.8–4.9)
Alkaline Phosphatase: 73 IU/L (ref 44–121)
Bilirubin Total: 0.6 mg/dL (ref 0.0–1.2)
Bilirubin, Direct: 0.16 mg/dL (ref 0.00–0.40)
Total Protein: 7.2 g/dL (ref 6.0–8.5)

## 2020-10-28 MED ORDER — ROSUVASTATIN CALCIUM 5 MG PO TABS: ORAL_TABLET | ORAL | 1 refills | Status: DC

## 2020-11-01 ENCOUNTER — Other Ambulatory Visit: Payer: Self-pay

## 2020-11-01 ENCOUNTER — Ambulatory Visit
Admission: RE | Admit: 2020-11-01 | Discharge: 2020-11-01 | Disposition: A | Discharge status: Home / Self Care | Payer: BC Managed Care – PPO | Source: Ambulatory Visit | Attending: Family Medicine | Admitting: Family Medicine

## 2020-11-01 DIAGNOSIS — Z1231 Encounter for screening mammogram for malignant neoplasm of breast: Secondary | ICD-10-CM

## 2020-12-16 ENCOUNTER — Other Ambulatory Visit: Payer: Self-pay | Admitting: Family Medicine

## 2020-12-16 ENCOUNTER — Encounter: Payer: Self-pay | Admitting: Nurse Practitioner

## 2020-12-18 ENCOUNTER — Telehealth: Payer: Self-pay

## 2020-12-18 ENCOUNTER — Encounter: Payer: Self-pay | Admitting: Family Medicine

## 2020-12-19 ENCOUNTER — Other Ambulatory Visit: Payer: Self-pay | Admitting: *Deleted

## 2020-12-19 ENCOUNTER — Other Ambulatory Visit: Payer: Self-pay | Admitting: Family Medicine

## 2020-12-19 MED ORDER — MELOXICAM 15 MG PO TABS: 15.0000 mg | ORAL_TABLET | Freq: Every day | ORAL | 0 refills | Status: DC

## 2020-12-19 MED ORDER — CITALOPRAM HYDROBROMIDE 20 MG PO TABS
ORAL_TABLET | ORAL | 1 refills | Status: DC
Start: 2020-12-19 — End: 2021-09-26

## 2020-12-19 NOTE — Telephone Encounter (Signed)
Hi [name],  Dr. Nicki Reaper sent in your refill yesterday afternoon to [facility]. Let us know if they did not receive it. Have a good day! Abigail Butts

## 2020-12-19 NOTE — Telephone Encounter (Signed)
Nurses Recommend meloxicam 15 mg, #30, 1 daily as needed  Also may have 6 months on Celexa follow-up by summertime Thanks-Dr. Nicki Reaper

## 2020-12-19 NOTE — Telephone Encounter (Signed)
Error

## 2021-01-01 ENCOUNTER — Encounter: Payer: Self-pay | Admitting: Family Medicine

## 2021-01-02 ENCOUNTER — Ambulatory Visit (INDEPENDENT_AMBULATORY_CARE_PROVIDER_SITE_OTHER): Payer: BC Managed Care – PPO | Admitting: Family Medicine

## 2021-01-02 ENCOUNTER — Other Ambulatory Visit: Payer: Self-pay

## 2021-01-02 ENCOUNTER — Encounter: Payer: Self-pay | Admitting: Family Medicine

## 2021-01-02 VITALS — HR 93 | Temp 98.1°F | Wt 191.6 lb

## 2021-01-02 DIAGNOSIS — T783XXA Angioneurotic edema, initial encounter: Secondary | ICD-10-CM | POA: Diagnosis not present

## 2021-01-02 MED ORDER — PREDNISONE 20 MG PO TABS: ORAL_TABLET | ORAL | 0 refills | Status: DC

## 2021-01-02 NOTE — Progress Notes (Signed)
   Subjective:    Patient ID: PAILYN BELLEVUE, female    DOB: Jun 13, 1961, 60 y.o.   MRN: [id number]  HPI  Patient arrives to discuss allergic reaction since Sunday.  No known cause- worse on Sunday and Monday  Very nice patient Is been working around some home equipment stamp other thanks that she is now having some hives breakout of various places on her face and mouth She states she ate some seasoned chicken from [place] the other day and felt tingling around her lips but she was having some of the spells before that happened Has not had this problem before No wheezing difficulty breathing no difficulty swallowing Review of Systems Please see above    Objective:   Physical Exam  Mouth is doing well there is no sign of any obstruction airway looks normal she does have some hives on the face lungs clear heart regular      Assessment & Plan:  Allergic reaction Prednisone taper If progressive troubles or worse or not improving over the course the next several days let us know Hold off on allergy testing currently If it does recur allergy testing warranted

## 2021-01-02 NOTE — Telephone Encounter (Signed)
Staff Please have the niece come today at 4:10 PM so we can see her evaluate and treat thank you

## 2021-01-02 NOTE — Telephone Encounter (Signed)
See other my chart message. Patient being seen by Dr Nicki Reaper at 4:10 pm today

## 2021-01-05 ENCOUNTER — Encounter: Payer: Self-pay | Admitting: Family Medicine

## 2021-01-06 ENCOUNTER — Encounter: Payer: Self-pay | Admitting: Nurse Practitioner

## 2021-01-06 ENCOUNTER — Other Ambulatory Visit: Payer: Self-pay | Admitting: Nurse Practitioner

## 2021-01-06 MED ORDER — TRIAMCINOLONE ACETONIDE 0.1 % EX CREA: 1.0000 "application " | TOPICAL_CREAM | Freq: Two times a day (BID) | CUTANEOUS | 0 refills | Status: DC

## 2021-01-08 MED ORDER — MUPIROCIN 2 % EX OINT: TOPICAL_OINTMENT | CUTANEOUS | 1 refills | Status: DC

## 2021-01-08 NOTE — Addendum Note (Signed)
Addended by: Vicente Males on: 01/08/2021 04:59 PM   Modules accepted: Orders

## 2021-01-08 NOTE — Telephone Encounter (Signed)
Nurses  I would recommend Bactroban ointment apply thin amount 3 times daily to the areas on the left that are cracked and sore do this over the course of the next 5 to 7 days it should help speed up the healing. May send them 22 g tube with 1 refill

## 2021-01-26 ENCOUNTER — Other Ambulatory Visit: Payer: Self-pay

## 2021-01-26 ENCOUNTER — Ambulatory Visit: Payer: BC Managed Care – PPO | Admitting: Nurse Practitioner

## 2021-01-26 NOTE — Patient Instructions (Signed)
[email or web address]  Also look into saxenda (generic name is liraglutide).

## 2021-01-26 NOTE — Progress Notes (Signed)
   Subjective:    Patient ID: [name], female    DOB: 03-Apr-1961, 60 y.o.   MRN: [id number]  HPI Patient presents for follow-up of weight loss.  She has been taking phentermine for 6 months.  She lost about 20lbs, but plateaued since her last visit with [place] 3 months ago. She describes a very active lifestyle and eating much better over the last 6 months.  She is able to eat healthier food and on a more regular schedule.  She feels great and has done well maintaining the weight loss since January.  Denies chest pain, shortness of breath, palpitations, sleep disturbance, anxiety.    Review of Systems  Constitutional: Negative for activity change, appetite change, fatigue and unexpected weight change.  Respiratory: Negative for chest tightness and shortness of breath.   Cardiovascular: Negative for chest pain, palpitations and leg swelling.  Psychiatric/Behavioral: Negative for agitation and sleep disturbance. The patient is not nervous/anxious.        Objective:   Physical Exam Vitals and nursing note reviewed. Exam conducted with a chaperone present.  Constitutional:      General: She is not in acute distress.    Appearance: Normal appearance.  Cardiovascular:     Rate and Rhythm: Normal rate and regular rhythm.     Heart sounds: Normal heart sounds.  Pulmonary:     Breath sounds: Normal breath sounds.  Neurological:     Mental Status: She is alert.  Psychiatric:        Mood and Affect: Mood normal.        Behavior: Behavior normal.        Thought Content: Thought content normal.        Judgment: Judgment normal.    Vitals:   01/26/21 1023  BP: 126/82  Pulse: 88  Temp: (!) 97.2 F (36.2 C)  SpO2: 98%       Assessment & Plan:   Problem List Items Addressed This Visit      Other   Morbid obesity ([place]) - Primary      Since she has been on phentermine for 6 months and weight has plateaued, recommend to start either Qsymia or Saxenda.  She is interested in  continuing with weight loss management and she is doing well with activity and diet at this time, but seen no weight loss over the last 3 months.  Pt to investigate insurance costs.  Recommended online coupons for patient to see if Qsymia is more affordable for her.  She will contact us once she determines if one of these medications if affordable for her and which pharmacy we can send the prescription to. Follow-up in 3 months once new medication has been started.

## 2021-01-26 NOTE — Progress Notes (Signed)
   Subjective:    Patient ID: [name], female    DOB: 01/26/1961, 60 y.o.   MRN: [id number]  Hyperlipidemia This is a chronic problem. Treatments tried: crestor. There are no compliance problems.       Review of Systems     Objective:   Physical Exam        Assessment & Plan:

## 2021-01-27 ENCOUNTER — Encounter: Payer: Self-pay | Admitting: Nurse Practitioner

## 2021-02-19 ENCOUNTER — Encounter: Payer: Self-pay | Admitting: Family Medicine

## 2021-04-30 ENCOUNTER — Other Ambulatory Visit: Payer: Self-pay | Admitting: Nurse Practitioner

## 2021-07-21 ENCOUNTER — Encounter: Payer: Self-pay | Admitting: Nurse Practitioner

## 2021-07-21 ENCOUNTER — Encounter: Payer: Self-pay | Admitting: Family Medicine

## 2021-07-21 ENCOUNTER — Other Ambulatory Visit: Payer: Self-pay | Admitting: Family Medicine

## 2021-07-21 DIAGNOSIS — M21619 Bunion of unspecified foot: Secondary | ICD-10-CM

## 2021-07-23 ENCOUNTER — Other Ambulatory Visit: Payer: Self-pay | Admitting: Family Medicine

## 2021-07-23 MED ORDER — ROSUVASTATIN CALCIUM 5 MG PO TABS: ORAL_TABLET | ORAL | 0 refills | Status: DC

## 2021-07-23 NOTE — Telephone Encounter (Signed)
Nurses  Please go ahead with referral to podiatry at [facility] reason bunion May relay the message to [facility] as well  Sofie Hartigan I would recommend consultation with podiatry our staff will help get you set up with [facility] which is a good podiatry group.  TakeCare-Dr. Nicki Reaper

## 2021-07-23 NOTE — Addendum Note (Signed)
Addended by: Vicente Males on: 07/23/2021 09:31 AM   Modules accepted: Orders

## 2021-08-07 HISTORY — PX: BUNIONECTOMY: SHX129

## 2021-08-09 ENCOUNTER — Encounter: Payer: Self-pay | Admitting: Podiatry

## 2021-08-09 ENCOUNTER — Ambulatory Visit (INDEPENDENT_AMBULATORY_CARE_PROVIDER_SITE_OTHER): Payer: BC Managed Care – PPO

## 2021-08-09 ENCOUNTER — Ambulatory Visit: Payer: BC Managed Care – PPO | Admitting: Podiatry

## 2021-08-09 ENCOUNTER — Other Ambulatory Visit: Payer: Self-pay

## 2021-08-09 ENCOUNTER — Other Ambulatory Visit: Payer: Self-pay | Admitting: Podiatry

## 2021-08-09 DIAGNOSIS — M205X2 Other deformities of toe(s) (acquired), left foot: Secondary | ICD-10-CM | POA: Diagnosis not present

## 2021-08-09 DIAGNOSIS — M21619 Bunion of unspecified foot: Secondary | ICD-10-CM | POA: Diagnosis not present

## 2021-08-09 DIAGNOSIS — M21611 Bunion of right foot: Secondary | ICD-10-CM

## 2021-08-09 NOTE — Patient Instructions (Signed)

## 2021-08-09 NOTE — Progress Notes (Signed)
Subjective:   Patient ID: [name], female   DOB: 60 y.o.   MRN: [id number]   HPI Patient presents stating she is had a painful bunion deformity of her left foot which has been getting worse over the last several years and recently it is started to hurt her the joint.  States that she is tried wider shoes she is tried soaks and other modalities without relief and has family history of this and states that increasing needs difficult to wear shoes.  Patient does not smoke likes to be active with mild deformity on the right   Review of Systems  All other systems reviewed and are negative.      Objective:  Physical Exam Vitals and nursing note reviewed.  Constitutional:      Appearance: She is well-developed.  Pulmonary:     Effort: Pulmonary effort is normal.  Musculoskeletal:        General: Normal range of motion.  Skin:    General: Skin is warm.  Neurological:     Mental Status: She is alert.    Neurovascular status found to be intact muscle strength found to be adequate range of motion adequate with prominence around the first metatarsal head left with redness around the joint surface pain moderate restriction of motion with what appears to be dorsal spurring discomfort of the metatarsal phalangeal joint and mild excessive motion of the inner phalangeal joint with no pain or indications of crepitus.  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  At this point because she had to drive here from a longer distance she wants  to review consent form and I allowed her to review consent form going over alternative treatments complications associated with this condition and she is willing to accept risk signed consent form after extensive review understanding total recovery can take upwards of 6 months and that possible fusion or implant may be necessary long-term and patient is scheduled for outpatient surgery with air fracture walker dispensed today with all instructions on usage and all questions answered.  Encouraged her to call if anything should come up and surgery is scheduled  X-rays indicate that there is elevation of the intermetatarsal angle left over right of approximate 15 degrees and dorsal spur formation first metatarsal head left over right foot

## 2021-08-10 ENCOUNTER — Telehealth: Payer: Self-pay | Admitting: Urology

## 2021-08-10 NOTE — Telephone Encounter (Signed)
DOS - 08/28/2021  [place] BUNIONECTOMY LEFT --- [id number]   [employer] EFFECTIVE DATE - 12/05/20   PLAN DEDUCTIBLE - $1,250.00  W/ $1,250.00 REMAINING OUT OF POCKET - $4,890.00 W/ $4,819.48 REMAINING COINSURANCE - 20% COPAY - $0.00   NO PRIOR AUTH IS REQUIRED

## 2021-08-27 ENCOUNTER — Encounter: Payer: Self-pay | Admitting: Nurse Practitioner

## 2021-08-27 MED ORDER — ONDANSETRON HCL 4 MG PO TABS: 4.0000 mg | ORAL_TABLET | Freq: Three times a day (TID) | ORAL | 0 refills | Status: DC | PRN

## 2021-08-27 MED ORDER — OXYCODONE-ACETAMINOPHEN 10-325 MG PO TABS: 1.0000 | ORAL_TABLET | ORAL | 0 refills | Status: DC | PRN

## 2021-08-27 NOTE — Addendum Note (Signed)
Addended by: Wallene Huh on: 08/27/2021 05:09 PM   Modules accepted: Orders

## 2021-08-28 DIAGNOSIS — M2012 Hallux valgus (acquired), left foot: Secondary | ICD-10-CM

## 2021-09-03 ENCOUNTER — Other Ambulatory Visit: Payer: Self-pay

## 2021-09-03 ENCOUNTER — Encounter: Payer: Self-pay | Admitting: Podiatry

## 2021-09-03 ENCOUNTER — Ambulatory Visit (INDEPENDENT_AMBULATORY_CARE_PROVIDER_SITE_OTHER): Payer: BC Managed Care – PPO

## 2021-09-03 ENCOUNTER — Ambulatory Visit (INDEPENDENT_AMBULATORY_CARE_PROVIDER_SITE_OTHER): Payer: BC Managed Care – PPO | Admitting: Podiatry

## 2021-09-03 DIAGNOSIS — M205X2 Other deformities of toe(s) (acquired), left foot: Secondary | ICD-10-CM | POA: Diagnosis not present

## 2021-09-03 DIAGNOSIS — M21619 Bunion of unspecified foot: Secondary | ICD-10-CM | POA: Diagnosis not present

## 2021-09-04 ENCOUNTER — Encounter: Payer: Self-pay | Admitting: Podiatry

## 2021-09-04 NOTE — Progress Notes (Signed)
Subjective:   Patient ID: [name], female   DOB: 60 y.o.   MRN: [id number]   HPI Patient states overall doing well has still been using her crutches but not having a lot of pain currently neuro   ROS      Objective:  Physical Exam  Vascular status intact negative Bevelyn Buckles' sign is noted with patient found to have well-healing surgical site first metatarsal left with wound edges well coapted range of motion good no crepitus of the joint surface noted     Assessment:  Doing well post osteotomy left first metatarsal     Plan:  H&P reviewed condition and I have recommended continued immobilization elevation compression with dressing applied today and gave instructions for ice and range of motion exercises.  Reappoint 3 weeks or earlier if needed  X-rays indicate the osteotomy is healing well fixation in place joint congruence

## 2021-09-11 ENCOUNTER — Encounter: Payer: Self-pay | Admitting: Family Medicine

## 2021-09-24 ENCOUNTER — Encounter: Payer: Self-pay | Admitting: Podiatry

## 2021-09-24 ENCOUNTER — Ambulatory Visit (INDEPENDENT_AMBULATORY_CARE_PROVIDER_SITE_OTHER): Payer: BC Managed Care – PPO | Admitting: Podiatry

## 2021-09-24 ENCOUNTER — Other Ambulatory Visit: Payer: Self-pay

## 2021-09-24 ENCOUNTER — Ambulatory Visit (INDEPENDENT_AMBULATORY_CARE_PROVIDER_SITE_OTHER): Payer: BC Managed Care – PPO

## 2021-09-24 DIAGNOSIS — M205X2 Other deformities of toe(s) (acquired), left foot: Secondary | ICD-10-CM | POA: Diagnosis not present

## 2021-09-24 DIAGNOSIS — Z9889 Other specified postprocedural states: Secondary | ICD-10-CM | POA: Diagnosis not present

## 2021-09-24 NOTE — Progress Notes (Signed)
Subjective:   Patient ID: [name], female   DOB: 60 y.o.   MRN: [id number]   HPI Patient presents stating overall doing very well and my skin just feels sensitive and I mildly swollen   ROS      Objective:  Physical Exam  Neurovascular status intact negative Bevelyn Buckles' sign noted the wound edges are coapted well there is slight irritation of the tissue but normal with good range of motion of the first MPJ mild edema      Assessment:  Normal amount of edema for this.  Postop with patient found to have good range of motion no crepitus of the joint stitches intact no drainage with incision site healing well     Plan:  Overall very pleased advised on compression elevation continued immobilization and patient will be seen back and I did discuss the pin is moved up dorsally and may have to be removed proximal at 1 point in the future  X-ray indicates the osteotomy is healing fine but there has been some dorsal movement of the proximal pin which may require removal if it becomes symptomatic during the postoperative period reappoint 6 weeks to evaluate

## 2021-09-25 ENCOUNTER — Other Ambulatory Visit: Payer: Self-pay | Admitting: Family Medicine

## 2021-10-02 ENCOUNTER — Encounter: Payer: Self-pay | Admitting: Family Medicine

## 2021-10-03 ENCOUNTER — Encounter: Payer: Self-pay | Admitting: Podiatry

## 2021-10-03 ENCOUNTER — Other Ambulatory Visit: Payer: Self-pay | Admitting: Podiatry

## 2021-10-03 MED ORDER — CYCLOBENZAPRINE HCL 5 MG PO TABS: 5.0000 mg | ORAL_TABLET | Freq: Three times a day (TID) | ORAL | 1 refills | Status: DC | PRN

## 2021-10-03 NOTE — Telephone Encounter (Signed)
Please advise 

## 2021-10-10 ENCOUNTER — Other Ambulatory Visit: Payer: Self-pay | Admitting: Family Medicine

## 2021-10-10 DIAGNOSIS — Z1231 Encounter for screening mammogram for malignant neoplasm of breast: Secondary | ICD-10-CM

## 2021-10-31 ENCOUNTER — Ambulatory Visit
Admission: RE | Admit: 2021-10-31 | Discharge: 2021-10-31 | Disposition: A | Discharge status: Home / Self Care | Payer: BC Managed Care – PPO | Source: Ambulatory Visit | Attending: Family Medicine | Admitting: Family Medicine

## 2021-10-31 DIAGNOSIS — Z1231 Encounter for screening mammogram for malignant neoplasm of breast: Secondary | ICD-10-CM

## 2021-11-05 ENCOUNTER — Ambulatory Visit (INDEPENDENT_AMBULATORY_CARE_PROVIDER_SITE_OTHER): Payer: BC Managed Care – PPO | Admitting: Podiatry

## 2021-11-05 ENCOUNTER — Encounter: Payer: Self-pay | Admitting: Podiatry

## 2021-11-05 ENCOUNTER — Other Ambulatory Visit: Payer: Self-pay

## 2021-11-05 ENCOUNTER — Ambulatory Visit (INDEPENDENT_AMBULATORY_CARE_PROVIDER_SITE_OTHER): Payer: BC Managed Care – PPO

## 2021-11-05 DIAGNOSIS — Z9889 Other specified postprocedural states: Secondary | ICD-10-CM

## 2021-11-05 DIAGNOSIS — M205X2 Other deformities of toe(s) (acquired), left foot: Secondary | ICD-10-CM | POA: Diagnosis not present

## 2021-11-05 NOTE — Progress Notes (Signed)
Subjective:   Patient ID: [name], female   DOB: 61 y.o.   MRN: [id number]   HPI Patient presents doing well overall but still have some stiffness in the joint   ROS      Objective:  Physical Exam  Neurovascular status intact negative Bevelyn Buckles' sign noted wound edges healed well excellent range of motion no crepitus of the joint surface left      Assessment:  Overall doing well post osteotomy first metatarsal left excellent range of motion no crepitus     Plan:  H&P final x-ray reviewed and discussed that is normal to still have some swelling and discomfort at the end of the day.  This should heal uneventfully encouraged to call with questions concerns which may arise  X-rays indicate that the osteotomy is healing well good alignment is noted fixation in place

## 2021-11-08 ENCOUNTER — Ambulatory Visit
Admission: RE | Admit: 2021-11-08 | Discharge: 2021-11-08 | Disposition: A | Discharge status: Home / Self Care | Payer: BC Managed Care – PPO | Source: Ambulatory Visit | Attending: Family Medicine | Admitting: Family Medicine

## 2021-11-13 ENCOUNTER — Other Ambulatory Visit: Payer: Self-pay

## 2021-11-13 ENCOUNTER — Ambulatory Visit (INDEPENDENT_AMBULATORY_CARE_PROVIDER_SITE_OTHER): Payer: BC Managed Care – PPO | Admitting: Family Medicine

## 2021-11-13 VITALS — Temp 97.1°F

## 2021-11-13 DIAGNOSIS — B349 Viral infection, unspecified: Secondary | ICD-10-CM | POA: Diagnosis not present

## 2021-11-13 DIAGNOSIS — R051 Acute cough: Secondary | ICD-10-CM | POA: Diagnosis not present

## 2021-11-13 DIAGNOSIS — J069 Acute upper respiratory infection, unspecified: Secondary | ICD-10-CM | POA: Diagnosis not present

## 2021-11-13 MED ORDER — HYDROCODONE BIT-HOMATROP MBR 5-1.5 MG/5ML PO SOLN: 5.0000 mL | Freq: Four times a day (QID) | ORAL | 0 refills | Status: AC | PRN

## 2021-11-13 MED ORDER — BENZONATATE 100 MG PO CAPS: 100.0000 mg | ORAL_CAPSULE | Freq: Three times a day (TID) | ORAL | 0 refills | Status: DC | PRN

## 2021-11-13 MED ORDER — ALBUTEROL SULFATE (2.5 MG/3ML) 0.083% IN NEBU: 2.5000 mg | INHALATION_SOLUTION | RESPIRATORY_TRACT | 12 refills | Status: DC | PRN

## 2021-11-13 NOTE — Progress Notes (Signed)
° °  Subjective:    Patient ID: [name], female    DOB: 01-Apr-1961, 61 y.o.   MRN: [id number]  Cough This is a new problem. The current episode started in the past 7 days.  Started sat Constant cough Slight wheeze No fever No doe Slight production No ear pain Some in the sinuses No other sx Tried tussin Also ibuprofen No other illnesses around her Review of Systems  Respiratory:  Positive for cough.       Objective:   Physical Exam General-in no acute distress Eyes-no discharge Lungs-respiratory rate normal, CTA CV-no murmurs,RRR Extremities skin warm dry no edema Neuro grossly normal Behavior normal, alert        Assessment & Plan:   Viral respiratory illness should gradually get better.  Can do home COVID test We went ahead and did triple swab here Await results Tessalon as needed Hycodan caution drowsiness

## 2021-11-14 ENCOUNTER — Encounter: Payer: Self-pay | Admitting: Family Medicine

## 2021-11-14 LAB — COVID-19, FLU A+B AND RSV
Influenza A, NAA: NOT DETECTED
Influenza B, NAA: NOT DETECTED
RSV, NAA: NOT DETECTED
SARS-CoV-2, NAA: NOT DETECTED

## 2021-11-14 LAB — SPECIMEN STATUS REPORT

## 2021-11-16 ENCOUNTER — Encounter: Payer: Self-pay | Admitting: Family Medicine

## 2021-11-16 ENCOUNTER — Other Ambulatory Visit: Payer: Self-pay | Admitting: Family Medicine

## 2021-11-16 MED ORDER — AMOXICILLIN-POT CLAVULANATE 875-125 MG PO TABS: 1.0000 | ORAL_TABLET | Freq: Two times a day (BID) | ORAL | 0 refills | Status: DC

## 2021-11-16 MED ORDER — PREDNISONE 20 MG PO TABS: ORAL_TABLET | ORAL | 0 refills | Status: DC

## 2021-11-18 ENCOUNTER — Encounter: Payer: Self-pay | Admitting: Family Medicine

## 2021-11-19 ENCOUNTER — Ambulatory Visit (INDEPENDENT_AMBULATORY_CARE_PROVIDER_SITE_OTHER): Payer: BC Managed Care – PPO | Admitting: Family Medicine

## 2021-11-19 ENCOUNTER — Other Ambulatory Visit: Payer: Self-pay

## 2021-11-19 VITALS — BP 144/62 | HR 66 | Temp 98.9°F | Wt 211.0 lb

## 2021-11-19 DIAGNOSIS — J019 Acute sinusitis, unspecified: Secondary | ICD-10-CM | POA: Diagnosis not present

## 2021-11-19 MED ORDER — HYDROCODONE BIT-HOMATROP MBR 5-1.5 MG/5ML PO SOLN: 5.0000 mL | ORAL | 0 refills | Status: AC | PRN

## 2021-11-19 NOTE — Telephone Encounter (Signed)
Nurses Unfortunately via [employer] it is very hard to know if a person's lungs are doing well or not without seeing them. Because it is giving her such a rough time at night I believe we need to recheck her to make sure that there is not pneumonia or other complicating factors I can see her toward the end of today or tomorrow for recheck please

## 2021-11-19 NOTE — Progress Notes (Signed)
° °  Subjective:    Patient ID: [name], female    DOB: 02/05/1961, 61 y.o.   MRN: [id number]  HPI Ongoing cough congestion denies wheezing difficulty breathing no vomiting or diarrhea.  Energy level subpar.  PMH benign.   Review of Systems     Objective:   Physical Exam  General-in no acute distress Eyes-no discharge Lungs-respiratory rate normal, CTA CV-no murmurs,RRR Extremities skin warm dry no edema Neuro grossly normal Behavior normal, alert       Assessment & Plan:  Reactive airway Finish out the prednisone Albuterol as needed Underlying bronchitis with acute rhinosinusitis Antibiotics have been prescribed Finish out antibiotics Cough medication ordered Caution drowsiness No sign of pneumonia

## 2021-11-19 NOTE — Telephone Encounter (Signed)
Left message to return call 

## 2021-11-19 NOTE — Telephone Encounter (Signed)
Patient scheduled follow up appointment today at 4:15 pm with Dr Nicki Reaper

## 2021-12-03 ENCOUNTER — Encounter: Payer: Self-pay | Admitting: Family Medicine

## 2021-12-04 ENCOUNTER — Encounter: Payer: Self-pay | Admitting: Family Medicine

## 2021-12-04 NOTE — Telephone Encounter (Signed)
Nurses This raises into question possibility of reflux? Would be a good idea for her to consider having the bed on a slight slope to lessen the risk of reflux Also given that this is been going on for approximately 4 weeks chest x-ray would be reasonable but also a follow-up visit within the next 7 to 10 days

## 2021-12-05 ENCOUNTER — Ambulatory Visit (HOSPITAL_COMMUNITY)
Admission: RE | Admit: 2021-12-05 | Discharge: 2021-12-05 | Disposition: A | Discharge status: Home / Self Care | Payer: BC Managed Care – PPO | Source: Ambulatory Visit | Attending: Family Medicine | Admitting: Family Medicine

## 2021-12-05 ENCOUNTER — Other Ambulatory Visit: Payer: Self-pay | Admitting: Family Medicine

## 2021-12-05 ENCOUNTER — Encounter: Payer: Self-pay | Admitting: Family Medicine

## 2021-12-05 ENCOUNTER — Other Ambulatory Visit: Payer: Self-pay

## 2021-12-05 DIAGNOSIS — R059 Cough, unspecified: Secondary | ICD-10-CM | POA: Insufficient documentation

## 2021-12-05 NOTE — Telephone Encounter (Signed)
Please see other my chart message. Thank you ?

## 2021-12-06 ENCOUNTER — Ambulatory Visit: Payer: Self-pay | Admitting: Surgery

## 2021-12-06 NOTE — H&P (Signed)
?  JAMIRIA LANGILL ?[id number]  ? ?Referring Provider:  Self ? ? ?Subjective  ? ?Chief Complaint: New Consultation ?  ? ? ?History of Present Illness: ?   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  This has been present for about 18 years, and has slightly increased in size over that time.  She was evaluated by Dr. Lucia Gaskins several years ago and had planned to have it excised, but then due to the [place] pandemic did not pursue this elective procedure.  It is slightly uncomfortable.  She wishes to pursue removal at this time. ? ?Review of Systems: ?A complete review of systems was obtained from the patient.  I have reviewed this information and discussed as appropriate with the patient.  See HPI as well for other ROS. ? ? ?Medical History: ?History reviewed. No pertinent past medical history. ? ?There is no problem list on file for this patient. ? ? ?Past Surgical History:  ?Procedure Laterality Date  ? HYSTERECTOMY    ?  ? ?No Known Allergies ? ?Current Outpatient Medications on File Prior to Visit  ?Medication Sig Dispense Refill  ? ascorbic acid (VITAMIN C ORAL) Take by mouth    ? BIOTIN ORAL Take by mouth    ? citalopram (CELEXA) 20 MG tablet Take 20 mg by mouth once daily    ? ergocalciferol, vitamin D2, (VITAMIN D2 ORAL) Take by mouth    ? rosuvastatin (CRESTOR) 5 MG tablet Take 5 mg by mouth once daily    ? ?No current facility-administered medications on file prior to visit.  ? ? ?Family History  ?Problem Relation Age of Onset  ? High blood pressure (Hypertension) Mother   ? Stroke Mother   ? Stroke Father   ? Breast cancer Sister   ?  ? ?Social History  ? ?Tobacco Use  ?Smoking Status Never  ?Smokeless Tobacco Never  ?  ? ?Social History  ? ?Socioeconomic History  ? Marital status: Married  ?Tobacco Use  ? Smoking status: Never  ? Smokeless tobacco: Never  ? ? ?Objective:  ? ? ?Vitals:  ? 12/06/21 1055  ?BP: 122/84  ?Pulse: (!) 125  ?Weight: 94.9  kg (209 lb 3.2 oz)  ?Height: 172.7 cm (5\' 8" )  ?  ?Body mass index is 31.81 kg/m?. ? ?Alert and well-appearing ?Unlabored respirations ?There is a approximately 6 cm mobile smooth, soft subcutaneous mass overlying the left clavicle, no overlying skin changes. ? ?Assessment and Plan:  ?Diagnoses and all orders for this visit: ? ?Subcutaneous mass ? ?Discussed excision.  Risks of bleeding, infection, pain, scarring, healing problems, seroma, injury to nearby structures, recurrence.  Questions welcomed and answered.  We will schedule at her convenience. ?  ? ?[facility] Foots Raquel James, MD  ? ?

## 2021-12-07 ENCOUNTER — Other Ambulatory Visit: Payer: Self-pay | Admitting: Family Medicine

## 2021-12-07 DIAGNOSIS — E785 Hyperlipidemia, unspecified: Secondary | ICD-10-CM

## 2021-12-07 DIAGNOSIS — Z79899 Other long term (current) drug therapy: Secondary | ICD-10-CM

## 2021-12-07 MED ORDER — FLUTICASONE PROPIONATE HFA 110 MCG/ACT IN AERO: INHALATION_SPRAY | RESPIRATORY_TRACT | 2 refills | Status: DC

## 2021-12-07 MED ORDER — PANTOPRAZOLE SODIUM 40 MG PO TBEC: 40.0000 mg | DELAYED_RELEASE_TABLET | Freq: Every day | ORAL | 3 refills | Status: DC

## 2021-12-07 NOTE — Telephone Encounter (Signed)
Nurses ? ?Please assist making sure the front schedule is the niece with me somewhere within the next 2 to 3 weeks-this may be somewhat challenging because I am taking a few days off.  But we should be able to get her in before the end of the month. ? ?Also you may pass along following message ? ?Hi [name] ? ?I would not recommend omeprazole this would interact with Celexa.  I sent in a prescription of what is called Protonix 1 daily does reduce acid reflux but does not stop her reflux (hint-no reflux medicine stops reflux it just reduces the acid that comes up) ? ?I recommend Flovent essentially 1 inhalation-I recommend you look online or watch a YouTube video how to properly use an inhaler, followed 1 minute later by the second inhalation.  Rinse your mouth with water after use.  Do these inhalations twice daily. ? ?Your medication was sent in to [place] ? ?We will see you later in the month. ? ?We can have a more complete discussion at that time when you follow-up thanks-Dr. Nicki Reaper ?

## 2021-12-11 ENCOUNTER — Encounter: Payer: Self-pay | Admitting: Family Medicine

## 2021-12-11 NOTE — Telephone Encounter (Signed)
Nurses-recommend office visit with me somewhere within the next several days either this week or early next week ? ?Hard to know if this is a sign of any type of worrisome issue it is best to see her in person so we can have a fluid discussion and examine the needs ?

## 2021-12-13 ENCOUNTER — Emergency Department (HOSPITAL_COMMUNITY)
Admission: EM | Admit: 2021-12-13 | Discharge: 2021-12-13 | Disposition: A | Discharge status: Home / Self Care | Payer: BC Managed Care – PPO | Attending: Emergency Medicine | Admitting: Emergency Medicine

## 2021-12-13 ENCOUNTER — Encounter (HOSPITAL_COMMUNITY): Payer: Self-pay | Admitting: *Deleted

## 2021-12-13 ENCOUNTER — Emergency Department (HOSPITAL_COMMUNITY): Payer: BC Managed Care – PPO

## 2021-12-13 DIAGNOSIS — N83202 Unspecified ovarian cyst, left side: Secondary | ICD-10-CM | POA: Diagnosis not present

## 2021-12-13 DIAGNOSIS — R1032 Left lower quadrant pain: Secondary | ICD-10-CM | POA: Diagnosis present

## 2021-12-13 LAB — COMPREHENSIVE METABOLIC PANEL
ALT: 34 U/L (ref 0–44)
AST: 24 U/L (ref 15–41)
Albumin: 3.8 g/dL (ref 3.5–5.0)
Alkaline Phosphatase: 55 U/L (ref 38–126)
Anion gap: 7 (ref 5–15)
BUN: 12 mg/dL (ref 6–20)
CO2: 24 mmol/L (ref 22–32)
Calcium: 8.8 mg/dL — ABNORMAL LOW (ref 8.9–10.3)
Chloride: 109 mmol/L (ref 98–111)
Creatinine, Ser: 0.6 mg/dL (ref 0.44–1.00)
GFR, Estimated: >60 mL/min (ref >60)
Glucose, Bld: 162 mg/dL — ABNORMAL HIGH (ref 70–99)
Potassium: 3.3 mmol/L — ABNORMAL LOW (ref 3.5–5.1)
Sodium: 140 mmol/L (ref 135–145)
Total Bilirubin: 0.3 mg/dL (ref 0.3–1.2)
Total Protein: 6.7 g/dL (ref 6.5–8.1)

## 2021-12-13 LAB — CBC WITH DIFFERENTIAL/PLATELET
Abs Immature Granulocytes: 0.02 10*3/uL (ref 0.00–0.07)
Basophils Absolute: 0 10*3/uL (ref 0.0–0.1)
Basophils Relative: 1 %
Eosinophils Absolute: 0.3 10*3/uL (ref 0.0–0.5)
Eosinophils Relative: 5 %
HCT: 38.3 % (ref 36.0–46.0)
Hemoglobin: 12.5 g/dL (ref 12.0–15.0)
Immature Granulocytes: 0 %
Lymphocytes Relative: 27 %
Lymphs Abs: 1.7 10*3/uL (ref 0.7–4.0)
MCH: 28.9 pg (ref 26.0–34.0)
MCHC: 32.6 g/dL (ref 30.0–36.0)
MCV: 88.7 fL (ref 80.0–100.0)
Monocytes Absolute: 0.4 10*3/uL (ref 0.1–1.0)
Monocytes Relative: 6 %
Neutro Abs: 4 10*3/uL (ref 1.7–7.7)
Neutrophils Relative %: 61 %
Platelets: 299 10*3/uL (ref 150–400)
RBC: 4.32 MIL/uL (ref 3.87–5.11)
RDW: 13.3 % (ref 11.5–15.5)
WBC: 6.4 10*3/uL (ref 4.0–10.5)
nRBC: 0 % (ref 0.0–0.2)

## 2021-12-13 LAB — LIPASE, BLOOD: Lipase: 32 U/L (ref 11–51)

## 2021-12-13 MED ORDER — IOHEXOL 300 MG/ML  SOLN
100.0000 mL | Freq: Once | INTRAMUSCULAR | Status: AC | PRN
  Administered 2021-12-13: 18:00:00 100 mL via INTRAVENOUS

## 2021-12-13 MED ORDER — ONDANSETRON HCL 4 MG/2ML IJ SOLN
4.0000 mg | Freq: Once | INTRAMUSCULAR | Status: AC
  Administered 2021-12-13: 16:00:00 4 mg via INTRAVENOUS
  Filled 2021-12-13: qty 2

## 2021-12-13 MED ORDER — HYDROCODONE-ACETAMINOPHEN 5-325 MG PO TABS: 2.0000 | ORAL_TABLET | ORAL | 0 refills | Status: DC | PRN

## 2021-12-13 MED ORDER — SODIUM CHLORIDE 0.9 % IV SOLN: INTRAVENOUS | Status: DC

## 2021-12-13 MED ORDER — HYDROCODONE-ACETAMINOPHEN 5-325 MG PO TABS: 1.0000 | ORAL_TABLET | Freq: Four times a day (QID) | ORAL | 0 refills | Status: DC | PRN

## 2021-12-13 MED ORDER — HYDROMORPHONE HCL 1 MG/ML IJ SOLN
1.0000 mg | Freq: Once | INTRAMUSCULAR | Status: AC
  Administered 2021-12-13: 19:00:00 1 mg via INTRAVENOUS
  Filled 2021-12-13: qty 1

## 2021-12-13 MED ORDER — HYDROMORPHONE HCL 1 MG/ML IJ SOLN
1.0000 mg | Freq: Once | INTRAMUSCULAR | Status: AC
Start: 2021-12-13 — End: 2021-12-13
  Administered 2021-12-13: 16:00:00 1 mg via INTRAVENOUS
  Filled 2021-12-13: qty 1

## 2021-12-13 MED ORDER — ONDANSETRON 4 MG PO TBDP: 4.0000 mg | ORAL_TABLET | Freq: Three times a day (TID) | ORAL | 1 refills | Status: DC | PRN

## 2021-12-13 MED ORDER — ONDANSETRON HCL 4 MG/2ML IJ SOLN
4.0000 mg | Freq: Once | INTRAMUSCULAR | Status: AC
  Administered 2021-12-13: 21:00:00 4 mg via INTRAVENOUS
  Filled 2021-12-13: qty 2

## 2021-12-13 NOTE — ED Notes (Signed)
Pt given prepack and signed paper ?

## 2021-12-13 NOTE — ED Provider Notes (Addendum)
[facility] EMERGENCY DEPARTMENT Provider Note   CSN: [id number] Arrival date & time: 12/13/21  1454     History  Chief Complaint  Patient presents with   Abdominal Pain    [name] is a 61 y.o. female.  Patient with the onset of left lower quadrant abdominal pain approximately an hour ago.  Patient felt fine earlier.  Never had pain in this area before.  Associated with vomiting no vomiting of blood.  Patient's past surgical history significant for abdominal hysterectomy and removal of her right ovary.  Still has her left.  Past medical history otherwise noncontributory.      Home Medications Prior to Admission medications   Medication Sig Start Date End Date Taking? Authorizing Provider  albuterol (PROVENTIL) (2.5 MG/3ML) 0.083% nebulizer solution Take 3 mLs (2.5 mg total) by nebulization every 4 (four) hours as needed for wheezing. 11/13/21  Yes Kathyrn Drown, MD  Biotin 5 MG TABS Take by mouth.   Yes [provider]  Cholecalciferol (VITAMIN D3 PO) Take by mouth.   Yes [provider]  citalopram (CELEXA) 20 MG tablet TAKE ONE (1) TABLET BY MOUTH EVERY DAY 09/26/21  Yes Luking, Anita Laguna A, MD  fluticasone (FLOVENT HFA) 110 MCG/ACT inhaler 2 puffs twice daily rinse mouth after use Patient taking differently: 2 puffs 2 (two) times daily. inse mouth after use 12/07/21  Yes Luking, Cloey Sferrazza A, MD  pantoprazole (PROTONIX) 40 MG tablet Take 1 tablet (40 mg total) by mouth daily. 12/07/21  Yes Luking, Shambria Camerer A, MD  rosuvastatin (CRESTOR) 5 MG tablet TAKE ONE TABLET BY MOUTH ON MONDAYS, WEDNESDAYS, AND FRIDAYS FOR CHOLESTEROL Patient taking differently: Take 5 mg by mouth See admin instructions. TAKE ONE TABLET BY MOUTH ON MONDAYS, WEDNESDAYS, AND FRIDAYS FOR CHOLESTEROL. 09/26/21  Yes Kathyrn Drown, MD  vitamin C (ASCORBIC ACID) 500 MG tablet Take 500 mg by mouth daily.   Yes [provider]  amoxicillin-clavulanate (AUGMENTIN) 875-125 MG tablet Take 1 tablet  by mouth 2 (two) times daily. Patient not taking: Reported on 12/13/2021 11/16/21   Kathyrn Drown, MD  benzonatate (TESSALON PERLES) 100 MG capsule Take 1 capsule (100 mg total) by mouth 3 (three) times daily as needed for cough. Patient not taking: Reported on 12/13/2021 11/13/21   Kathyrn Drown, MD  cyclobenzaprine (FLEXERIL) 5 MG tablet Take 1 tablet (5 mg total) by mouth 3 (three) times daily as needed for muscle spasms. Patient not taking: Reported on 12/13/2021 10/03/21   Wallene Huh, DPM  ondansetron (ZOFRAN) 4 MG tablet Take 1 tablet (4 mg total) by mouth every 8 (eight) hours as needed for nausea or vomiting. Patient not taking: Reported on 12/13/2021 08/27/21   Wallene Huh, DPM  predniSONE (DELTASONE) 20 MG tablet 2 qd for 5 Patient not taking: Reported on 12/13/2021 11/16/21   Kathyrn Drown, MD      Allergies    Patient has no known allergies.    Review of Systems   Review of Systems  Constitutional:  Negative for chills and fever.  HENT:  Negative for ear pain and sore throat.   Eyes:  Negative for pain and visual disturbance.  Respiratory:  Negative for cough and shortness of breath.   Cardiovascular:  Negative for chest pain and palpitations.  Gastrointestinal:  Positive for abdominal pain, nausea and vomiting.  Genitourinary:  Negative for dysuria and hematuria.  Musculoskeletal:  Negative for arthralgias and back pain.  Skin:  Negative for color  change and rash.  Neurological:  Negative for seizures and syncope.  All other systems reviewed and are negative.  Physical Exam Updated Vital Signs BP 136/76    Pulse 62    Temp 98.1 F (36.7 C) (Oral)    Resp 18    Ht 1.727 m ('5\' 8"'$ )    Wt 86.2 kg    SpO2 92%    BMI 28.89 kg/m  Physical Exam Vitals and nursing note reviewed.  Constitutional:      General: She is not in acute distress.    Appearance: Normal appearance. She is well-developed.  HENT:     Head: Normocephalic and atraumatic.  Eyes:      Conjunctiva/sclera: Conjunctivae normal.  Cardiovascular:     Rate and Rhythm: Normal rate and regular rhythm.     Heart sounds: No murmur heard. Pulmonary:     Effort: Pulmonary effort is normal. No respiratory distress.     Breath sounds: Normal breath sounds.  Abdominal:     Palpations: Abdomen is soft.     Tenderness: There is abdominal tenderness.  Musculoskeletal:        General: No swelling.     Cervical back: Neck supple.  Skin:    General: Skin is warm and dry.     Capillary Refill: Capillary refill takes less than 2 seconds.  Neurological:     General: No focal deficit present.     Mental Status: She is alert and oriented to person, place, and time.  Psychiatric:        Mood and Affect: Mood normal.    ED Results / Procedures / Treatments   Labs (all labs ordered are listed, but only abnormal results are displayed) Labs Reviewed  COMPREHENSIVE METABOLIC PANEL - Abnormal; Notable for the following components:      Result Value   Potassium 3.3 (*)    Glucose, Bld 162 (*)    Calcium 8.8 (*)    All other components within normal limits  LIPASE, BLOOD  CBC WITH DIFFERENTIAL/PLATELET    EKG None  Radiology CT Abdomen Pelvis W Contrast  Result Date: 12/13/2021 CLINICAL DATA:  Left lower quadrant pain EXAM: CT ABDOMEN AND PELVIS WITH CONTRAST TECHNIQUE: Multidetector CT imaging of the abdomen and pelvis was performed using the standard protocol following bolus administration of intravenous contrast. RADIATION DOSE REDUCTION: This exam was performed according to the departmental dose-optimization program which includes automated exposure control, adjustment of the mA and/or kV according to patient size and/or use of iterative reconstruction technique. CONTRAST:  152m OMNIPAQUE IOHEXOL 300 MG/ML  SOLN COMPARISON:  None. FINDINGS: Lower chest: Breathing motion and subsegmental atelectatic changes in the lung bases. Hepatobiliary: Liver is normal in size and contour. 1.5 cm  round hyperenhancing lesion in the right hepatic lobe segment 6. Gallstone with no gallbladder wall thickening or pericholecystic edema. No biliary ductal dilatation. Pancreas: Unremarkable. No pancreatic ductal dilatation or surrounding inflammatory changes. Spleen: Normal in size without focal abnormality. Adrenals/Urinary Tract: Adrenal glands appear normal. 1.4 cm cyst in the upper right kidney. Kidneys appear otherwise normal. Urinary bladder is normal. Stomach/Bowel: No bowel obstruction, free air or pneumatosis. No bowel wall edema identified. Appendix is normal. Vascular/Lymphatic: No significant vascular findings are present. No enlarged abdominal or pelvic lymph nodes. Reproductive: Status post hysterectomy. There is a 6.6 x 6.6 cm predominantly hyperdense left adnexal/ovarian mass. No surrounding inflammatory fat stranding. Other: No ascites. Musculoskeletal: No suspicious bony lesions. IMPRESSION: 1. 6.6 cm hyperdense left adnexal/ovarian mass, likely a  hemorrhagic cyst/mass. Recommend follow-up pelvic ultrasound. 2. 1.5 cm hyperenhancing lesion in the right hepatic lobe. Most likely differentials include hemangioma, FNH, adenoma. Consider nonemergent follow-up MRI abdomen with contrast. Electronically Signed   By: Ofilia Neas M.D.   On: 12/13/2021 19:00    Procedures Procedures    Medications Ordered in ED Medications  0.9 %  sodium chloride infusion ( Intravenous New Bag/Given 12/13/21 1615)  ondansetron (ZOFRAN) injection 4 mg (has no administration in time range)  ondansetron (ZOFRAN) injection 4 mg (4 mg Intravenous Given 12/13/21 1616)  HYDROmorphone (DILAUDID) injection 1 mg (1 mg Intravenous Given 12/13/21 1617)  iohexol (OMNIPAQUE) 300 MG/ML solution 100 mL (100 mLs Intravenous Contrast Given 12/13/21 1818)  HYDROmorphone (DILAUDID) injection 1 mg (1 mg Intravenous Given 12/13/21 1840)    ED Course/ Medical Decision Making/ A&P                           Medical Decision  Making Amount and/or Complexity of Data Reviewed Labs: ordered. Radiology: ordered.  Risk Prescription drug management.   Patient with acute onset of left lower quadrant abdominal pain.  Never is anything like this before.  Differential includes concerns for diverticulitis.  Also concerns for bowel obstruction since she has had a laparoscopic abdominal hysterectomy.  We will get CT scan of the abdomen.  We will get labs.  Treat with pain medicine and antinausea medicine.  Patient's labs are very reassuring.  Liver function test normal.  Potassium a little low at 3.3 but nothing significant CBC normal white count hemoglobin very normal at 12.5.  Patient's platelets are normal.  CT scan is significant for 6.6 cm hyperdense left Ovarian mass likely hemorrhagic cyst mass recommend follow-up with pelvic ultrasound.  This can be done as an outpatient.  Will refer [facility] to [facility].  Patient's pain is under better control here.  We will give her a prepack of hydrocodone and then a prescription that she can pick up tomorrow.  We will give her a dose of Zofran here before she goes.  And also give her a prescription for Zofran.  Her pharmacy is currently closed.    Final Clinical Impression(s) / ED Diagnoses Final diagnoses:  Left lower quadrant abdominal pain  Cyst of left ovary    Rx / DC Orders ED Discharge Orders     None         Fredia Sorrow, MD 12/13/21 1546    Fredia Sorrow, MD 12/13/21 2025

## 2021-12-13 NOTE — Discharge Instructions (Signed)
Cup shows left ovarian cyst or mass hemorrhagic.  Close follow-up with OB/GYN will be important.  Give [facility] a call.  Take pain medication as needed take the antinausea medicine as needed.  Return for the feeling like you are going to pass out or for feeling very lightheaded.  Your blood counts today were very normal.  No evidence of any significant anemia or any significant bleeding into the abdomen. ?

## 2021-12-13 NOTE — ED Triage Notes (Signed)
Left lower quadrant pain onset today ?

## 2021-12-14 ENCOUNTER — Encounter: Payer: Self-pay | Admitting: Family Medicine

## 2021-12-14 MED FILL — Hydrocodone-Acetaminophen Tab 5-325 MG: ORAL | Qty: 6 | Status: AC

## 2021-12-17 ENCOUNTER — Ambulatory Visit (INDEPENDENT_AMBULATORY_CARE_PROVIDER_SITE_OTHER): Payer: BC Managed Care – PPO | Admitting: Family Medicine

## 2021-12-17 ENCOUNTER — Encounter: Payer: Self-pay | Admitting: Family Medicine

## 2021-12-17 ENCOUNTER — Other Ambulatory Visit: Payer: Self-pay

## 2021-12-17 VITALS — BP 155/87 | HR 75 | Temp 98.0°F

## 2021-12-17 DIAGNOSIS — D1803 Hemangioma of intra-abdominal structures: Secondary | ICD-10-CM

## 2021-12-17 DIAGNOSIS — R053 Chronic cough: Secondary | ICD-10-CM

## 2021-12-17 DIAGNOSIS — K8020 Calculus of gallbladder without cholecystitis without obstruction: Secondary | ICD-10-CM | POA: Diagnosis not present

## 2021-12-17 DIAGNOSIS — N838 Other noninflammatory disorders of ovary, fallopian tube and broad ligament: Secondary | ICD-10-CM

## 2021-12-17 NOTE — Progress Notes (Signed)
? ?  Subjective:  ? ? Patient ID: [name], female    DOB: 1961/06/27, 61 y.o.   MRN: [id number] ? ?HPI ?Pt following up on cough. Provider would like to follow up per my chart message. Cough has improved per patient.  ?Cough much better only using inhaler as needed basis ?Still taking reflux related medicine ?Hospital on Thursday due to pain in left side. Hemorraghic cyst on ovary-referral to Gyn  ?ER note was reviewed ?Patient with significant left lower quadrant pain and discomfort now doing better ?Denies any prior trouble ?Has had a hysterectomy in the past ?No weight loss fevers or chills ? ?Review of Systems ? ?   ?Objective:  ? Physical Exam ? ?Lungs clear respiratory rate normal heart regular abdomen soft ? ? ?   ?Assessment & Plan:  ?1. Chronic cough ?Lungs are clear may use the inhaler on a as needed basis ?Use 6 weeks worth of reflux related medicine and then stop ?2. Calculus of gallbladder without cholecystitis without obstruction ?Asymptomatic currently warning signs were discussed no postprandial right upper quadrant pain or nausea ? ?3. Hepatic hemangioma ?MRI of the liver lesion is recommended cannot tell for certain what this is on CT scan.  Radiology/radiologist recommends MRI. ? ?4. Enlarged ovary ?GYN referral, will need ultrasound perhaps also needs Ca1 25 as well as laparoscopic removal ?Fax number-[phone] ?I did speak with [facility].  They will be happy to help set the patient up with an appointment hopefully this can be in the near future patient very concerned about this issue. ?

## 2021-12-18 ENCOUNTER — Other Ambulatory Visit: Payer: Self-pay | Admitting: *Deleted

## 2021-12-18 DIAGNOSIS — N838 Other noninflammatory disorders of ovary, fallopian tube and broad ligament: Secondary | ICD-10-CM

## 2021-12-18 DIAGNOSIS — D1803 Hemangioma of intra-abdominal structures: Secondary | ICD-10-CM

## 2021-12-18 NOTE — Progress Notes (Signed)
Referral ordered in EPIC. 

## 2021-12-18 NOTE — Progress Notes (Signed)
MRI abdomen with contrast ordered in Epic. ?

## 2021-12-20 NOTE — Addendum Note (Signed)
Addended by: Vicente Males on: 12/20/2021 11:15 AM ? ? Modules accepted: Orders ? ?

## 2021-12-21 DIAGNOSIS — D1803 Hemangioma of intra-abdominal structures: Secondary | ICD-10-CM | POA: Insufficient documentation

## 2021-12-21 DIAGNOSIS — N83202 Unspecified ovarian cyst, left side: Secondary | ICD-10-CM | POA: Insufficient documentation

## 2021-12-26 ENCOUNTER — Other Ambulatory Visit: Payer: Self-pay | Admitting: Family Medicine

## 2021-12-26 NOTE — Telephone Encounter (Signed)
Nurses ?It is not necessary for her to do office visit on the 31st ?You may cancel this appointment ? ?I would like for her to do a lipid profile as well as metabolic 7 this can be done at her convenience in the next few weeks in order to see how her cholesterol and potassium are doing ? ?You may forward the above message to her as well as the following ? ?Hi Geralene ?Hope you are feeling better with the cough.  As for the specialist did the gynecology office connect with you to set up your appointment yet?  If so when is it?  I spoke with them within a day of seeing you and the referral coordinator stated that they would reach out to you to schedule an appointment.  Please keep [place] updated on what they find out and request to them that they send a copy of the record to [place] ? ?If we can be of more help please let me know thanks-Dr. Nicki Reaper ?

## 2022-01-04 ENCOUNTER — Ambulatory Visit: Payer: BC Managed Care – PPO | Admitting: Family Medicine

## 2022-01-07 ENCOUNTER — Encounter: Payer: Self-pay | Admitting: Family Medicine

## 2022-01-14 ENCOUNTER — Ambulatory Visit (HOSPITAL_COMMUNITY)
Admission: RE | Admit: 2022-01-14 | Discharge: 2022-01-14 | Disposition: A | Discharge status: Home / Self Care | Payer: BC Managed Care – PPO | Source: Ambulatory Visit | Attending: Family Medicine | Admitting: Family Medicine

## 2022-01-14 DIAGNOSIS — D1803 Hemangioma of intra-abdominal structures: Secondary | ICD-10-CM | POA: Diagnosis present

## 2022-01-14 MED ORDER — GADOBUTROL 1 MMOL/ML IV SOLN
10.0000 mL | Freq: Once | INTRAVENOUS | Status: AC | PRN
  Administered 2022-01-14: 10 mL via INTRAVENOUS

## 2022-01-16 ENCOUNTER — Encounter: Payer: Self-pay | Admitting: Family Medicine

## 2022-01-29 ENCOUNTER — Encounter: Payer: Self-pay | Admitting: Gynecologic Oncology

## 2022-02-04 ENCOUNTER — Other Ambulatory Visit: Payer: Self-pay

## 2022-02-04 ENCOUNTER — Encounter: Payer: Self-pay | Admitting: Gynecologic Oncology

## 2022-02-04 ENCOUNTER — Inpatient Hospital Stay: Payer: BC Managed Care – PPO | Attending: Gynecologic Oncology | Admitting: Gynecologic Oncology

## 2022-02-04 VITALS — BP 126/56 | HR 72 | Temp 98.0°F | Resp 16 | Ht 64.0 in | Wt 205.4 lb

## 2022-02-04 DIAGNOSIS — Z90721 Acquired absence of ovaries, unilateral: Secondary | ICD-10-CM | POA: Diagnosis not present

## 2022-02-04 DIAGNOSIS — E669 Obesity, unspecified: Secondary | ICD-10-CM | POA: Insufficient documentation

## 2022-02-04 DIAGNOSIS — Z90710 Acquired absence of both cervix and uterus: Secondary | ICD-10-CM | POA: Insufficient documentation

## 2022-02-04 DIAGNOSIS — I726 Aneurysm of vertebral artery: Secondary | ICD-10-CM | POA: Diagnosis not present

## 2022-02-04 DIAGNOSIS — Z79899 Other long term (current) drug therapy: Secondary | ICD-10-CM | POA: Diagnosis not present

## 2022-02-04 DIAGNOSIS — R1900 Intra-abdominal and pelvic swelling, mass and lump, unspecified site: Secondary | ICD-10-CM | POA: Diagnosis present

## 2022-02-04 DIAGNOSIS — Z6835 Body mass index (BMI) 35.0-35.9, adult: Secondary | ICD-10-CM | POA: Diagnosis not present

## 2022-02-04 DIAGNOSIS — K219 Gastro-esophageal reflux disease without esophagitis: Secondary | ICD-10-CM | POA: Diagnosis not present

## 2022-02-04 NOTE — Patient Instructions (Addendum)
We will tentatively plan for surgery on March 12, 2022 at [facility] with Dr. Jeral Pinch. We will see you back in the office closer to the date for a preop appointment on March 11, 2022 with Joylene John NP to discuss the instructions for before and after surgery.  ? ?You will need to avoid taking any medications that may thin your blood like aspirin, fish oil, turmeric, excedrin. Also the day before surgery, you need to take in a light diet and avoid foods that cause gas such as carbonated beverages, raw fruits and veggies, beans, or anything that causes gas for you.  ? ?You may also receive a phone call from the hospital to arrange for a pre-op appointment there as well. Usually both appointments can be combined on the same day.  ?

## 2022-02-04 NOTE — Progress Notes (Addendum)
GYNECOLOGIC ONCOLOGY NEW PATIENT CONSULTATION  ? ?Patient Name: [name]  ?Patient Age: 61 y.o. ?Date of Service: 02/04/22 ?Referring Provider: Dr. Brien Few ? ?Primary Care Provider: Kathyrn Drown, MD ?Consulting Provider: Jeral Pinch, MD  ? ?Assessment/Plan:  ?61yo with complex pelvic mass.   ? ?I reviewed imaging findings with the patient and her husband.  We looked at the CT images together.  Overall on CT scan, the mass looks relatively simple.  There is no findings of metastatic disease.  Patient had a subsequent ultrasound performed outside of our [facility] system with some features increasing the risk of malignancy.  We discussed that imaging is not diagnostic in the only way to make a definitive diagnosis is with surgical excision.  Luckily, the patient is asymptomatic at this point but I recommend diagnostic surgery given the size and appearance of her adnexal mass. ? ?We also discussed her normal CA-125.  I stressed that this is not a test that is approved for diagnosis.  This is a test that is useful to decide who best to do surgery once the decision for surgery has been made.  Up to 50% of women with early stage ovarian cancer can have a normal CA-125 and many non-cancerous disease processes can cause the blood test to be elevated. ? ?Given her history of a vertebral artery aneurysm, will reach out to her primary care provider regarding preoperative clearance.  She has had major surgery since this diagnosis including her hysterectomy and thus I suspect will be cleared for surgery without any additional work-up. ? ?Patient's daughter is getting married in late May.  I think it is safe to wait until early June to proceed with surgery given her lack of symptoms.  I suspect that her acute symptoms several weeks ago may have been related to torsion. ? ?The plan will be to do surgery in a minimally invasive manner using a robotic approach.  I will place the enlarged adnexa in a bag for contained  cyst drainage.  The adnexa will then be sent to pathology for frozen section.  If benign, no further procedures will be performed.  If borderline tumor identified, then I will take some biopsies of the peritoneum as well as the omentum.  In the setting of malignancy, we discussed the addition of lymphadenectomy. ? ?Discussed the plan for a robotic assisted unilateral salpingo-oophorectomy, possible staging, possible laparotomy. The risks of surgery were discussed in detail and she understands these to include infection; wound separation; hernia; vaginal cuff separation, injury to adjacent organs such as bowel, bladder, blood vessels, ureters and nerves; bleeding which may require blood transfusion; anesthesia risk; thromboembolic events; possible death; unforeseen complications; possible need for re-exploration; medical complications such as heart attack, stroke, pleural effusion and pneumonia; and, if full lymphadenectomy is performed the risk of lymphedema and lymphocyst. The patient will receive DVT and antibiotic prophylaxis as indicated. She voiced a clear understanding. She had the opportunity to ask questions.  ? ?Given plan for surgery in a little over a month, the patient will return for perioperative teaching.  ? ?A copy of this note was sent to the patient's referring provider.  ? ?65 minutes of total time was spent for this patient encounter, including preparation, face-to-face counseling with the patient and coordination of care, and documentation of the encounter. ? ? ?Jeral Pinch, MD  ?Division of Gynecologic Oncology  ?Department of Obstetrics and Gynecology  ?[facility]  ?___________________________________________  ?Chief Complaint: ?No chief  complaint on file. ? ? ?History of Present Illness:  ?[name] is a 61 y.o. y.o. female who is seen in consultation at the request of Dr. Ronita Hipps for an evaluation of a complex adnexal mass. ? ?On March 9, after doing  some heavier housework, the patient developed left lower quadrant pain that she describes as feeling "like she got shot".  Pain was constant, sharp, wraps around to her left back.  The pain was unrelenting and she ultimately went to the emergency department.  She had some associated nausea and emesis.  She spent about a half a day in the emergency department and had imaging performed.  CT of the abdomen and pelvis on 12/13/2021 shows a 6.6 cm hyperdense left adnexal mass, likely hemorrhagic cyst.  Follow-up ultrasound recommended.  Hyperenhancing lesion in the right hepatic lobe also noted, most likely differential includes angioma, FNH, adenoma.  MRI of the abdomen and follow-up on 4/10 shows an enhancing 13 mm hepatic lesion demonstrating nonspecific imaging characteristics, favored to reflect benign etiology. ? ?After treatment with IV pain medication in the ER, pain subsided and has not returned.  She subsequently saw her primary care provider and then was referred to an OB/GYN. Pelvic MRI exam at [facility] on 01/03/2022 shows complex mass within the left adnexa.  Adnexa measures 7.6 x 7.3 x 6.2 cm.  Internal debris noted within the cystic portion of the uterus, and internal blood flow noted within the solid portions and irregular edges described. ? ?CA-125 was 9.7 on 01/03/22. ? ?She endorses a good appetite without nausea or emesis.  Reports normal bowel and bladder function.  Has had about a 20 pound weight gain over the last year.  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 ? 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 ? ?Patient has a history of what sounds like preeclampsia a week after her second delivery.  She was ultimately admitted to the ICU and ultimately the neuro ICU after she was found to have intracranial hemorrhage.  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 ? ?PAST MEDICAL HISTORY:  ?Past Medical History:  ?Diagnosis Date  ? Aneurysm ([place])   ? 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 BMI 35.0-35.9,adult   ? GERD (gastroesophageal reflux disease)   ? Normal spontaneous vaginal delivery   ? 2  ?  ? ?PAST SURGICAL HISTORY:  ?Past Surgical History:  ?Procedure Laterality Date  ? BREAST BIOPSY Left 07/15/2017  ? 2 masses  ? BUNIONECTOMY  08/2021  ? COLONOSCOPY N/A 06/09/2014  ? Procedure: COLONOSCOPY;  Surgeon: Rogene Houston, MD;  Location: [facility];  Service: Endoscopy;  Laterality: N/A;  830-moved to 945 Ann to notify pt  ? LAPAROSCOPIC TOTAL HYSTERECTOMY  07/18/2010  ? RSO  ? NECK SURGERY  11/2012  ? c6 c7  ? ? ?OB/GYN HISTORY:  ?OB History  ?Gravida Para Term Preterm AB Living  ?2 2          ?SAB IAB Ectopic Multiple Live Births  ?           ?  ?# Outcome Date GA Lbr Len/2nd Weight Sex Delivery Anes PTL Lv  ?2 Para           ?1 Para           ? ? ?  No LMP recorded. Patient has had a hysterectomy. ? ?Age at menarche: 34 ?Age at menopause: Hysterectomy at 21, has never had menopausal symptoms ?Hx of HRT: No ?Hx of STDs: No ?Last pap: 2021 ?History of abnormal pap smears: no ? ?SCREENING STUDIES:  ?Last mammogram: 2023  ?Last colonoscopy: 2015 ? ?MEDICATIONS: ?Outpatient Encounter Medications as of 02/04/2022  ?Medication Sig  ? Biotin 5 MG TABS Take by mouth.  ? Cholecalciferol (VITAMIN D3 PO) Take by mouth.  ? citalopram (CELEXA) 20 MG tablet TAKE ONE (1) TABLET BY MOUTH EVERY DAY  ? loratadine (CLARITIN) 10 MG tablet Take 10 mg by mouth daily.  ? pantoprazole (PROTONIX) 40 MG tablet Take 1 tablet (40 mg total) by mouth daily.  ? rosuvastatin (CRESTOR) 5 MG tablet TAKE ONE TABLET BY MOUTH ON MONDAYS, WEDNESDAYS, AND FRIDAYS FOR CHOLESTEROL  ? vitamin C (ASCORBIC ACID) 500 MG tablet Take 500 mg by mouth daily.  ? cyclobenzaprine (FLEXERIL) 5 MG tablet Take 1 tablet (5 mg total) by mouth 3  (three) times daily as needed for muscle spasms. (Patient not taking: Reported on 01/29/2022)  ? [DISCONTINUED] albuterol (PROVENTIL) (2.5 MG/3ML) 0.083% nebulizer solution Take 3 mLs (2.5 mg total) by nebulizatio

## 2022-02-05 ENCOUNTER — Other Ambulatory Visit: Payer: Self-pay | Admitting: Gynecologic Oncology

## 2022-02-05 DIAGNOSIS — R1900 Intra-abdominal and pelvic swelling, mass and lump, unspecified site: Secondary | ICD-10-CM

## 2022-02-07 ENCOUNTER — Ambulatory Visit: Payer: BC Managed Care – PPO | Admitting: Gynecologic Oncology

## 2022-02-15 ENCOUNTER — Ambulatory Visit (INDEPENDENT_AMBULATORY_CARE_PROVIDER_SITE_OTHER): Payer: BC Managed Care – PPO | Admitting: Family Medicine

## 2022-02-15 ENCOUNTER — Telehealth: Payer: Self-pay | Admitting: *Deleted

## 2022-02-15 DIAGNOSIS — N838 Other noninflammatory disorders of ovary, fallopian tube and broad ligament: Secondary | ICD-10-CM

## 2022-02-15 NOTE — Progress Notes (Signed)
? ?  Subjective:  ? ? Patient ID: [name], female    DOB: 27-Mar-1961, 61 y.o.   MRN: [id number] ? ?HPI ? ?Discuss forms with patient. ?Phone conversation ?Patient recently was discovered to have an ovarian mass ?They plan on doing surgery coming up in June ?She has had previous surgeries 1 recently for a foot surgery where she was given sedation anesthesia and did well with that ?She also had full anesthesia several years back and did well with that ?When her child was born in 2005 she did not have hypertensive crisis with aneurysm but this resolved the following year and she has not had a problem with this ?She was followed by [facility] ?Virtual Visit via Telephone Note ? ?I connected with Charlise Giovanetti Bourdeau on 02/15/22 at  4:10 PM EDT by telephone and verified that I am speaking with the correct person using two identifiers. ? ?Location: ?Patient: home ?Provider: office ?  ?I discussed the limitations, risks, security and privacy concerns of performing an evaluation and management service by telephone and the availability of in person appointments. I also discussed with the patient that there may be a patient responsible charge related to this service. The patient expressed understanding and agreed to proceed. ? ? ?History of Present Illness: ? ?  ?Observations/Objective: ? ? ?Assessment and Plan: ? ? ?Follow Up Instructions: ? ?  ?I discussed the assessment and treatment plan with the patient. The patient was provided an opportunity to ask questions and all were answered. The patient agreed with the plan and demonstrated an understanding of the instructions. ?  ?The patient was advised to call back or seek an in-person evaluation if the symptoms worsen or if the condition fails to improve as anticipated. ? ?I provided  17 minutes of non-face-to-face time during this encounter. ? ? ?Review of Systems ? ?   ?Objective:  ? Physical Exam ? ?Today's visit was via telephone ?Physical exam was not  possible for this visit ? ? ? ?   ?Assessment & Plan:  ?She is considered to be low risk for surgery back in 2005 she did have a small aneurysm which was followed by [facility] and was felt to be doing fine and they did not recommend any further follow-up ? ?Patient currently doing well has had 2 surgeries with anesthesia since then and did not have problem with this ? ?Patient can do walking and going up flights of steps without any chest pain or shortness of breath so therefore she is felt to be low risk for surgery. ? ?Early mobilization to prevent blood clots was discussed and the importance of following all advice of her surgeon ? ? ? ?

## 2022-02-15 NOTE — Telephone Encounter (Signed)
Ms. [name], [name] are scheduled for a virtual visit with your provider today.   ? ?Just as we do with appointments in the office, we must obtain your consent to participate.  Your consent will be active for this visit and any virtual visit you may have with one of our providers in the next 365 days.   ? ?If you have a MyChart account, I can also send a copy of this consent to you electronically.  All virtual visits are billed to your insurance company just like a traditional visit in the office.  As this is a virtual visit, video technology does not allow for your provider to perform a traditional examination.  This may limit your provider's ability to fully assess your condition.  If your provider identifies any concerns that need to be evaluated in person or the need to arrange testing such as labs, EKG, etc, we will make arrangements to do so.   ? ?Although advances in technology are sophisticated, we cannot ensure that it will always work on either your end or our end.  If the connection with a video visit is poor, we may have to switch to a telephone visit.  With either a video or telephone visit, we are not always able to ensure that we have a secure connection.   I need to obtain your verbal consent now.   Are you willing to proceed with your visit today?  ? ?[name] has provided verbal consent on 02/15/2022 for a virtual visit (video or telephone). ? ? ?Ned Card, LPN ?4/00/8676  1:95 PM ?  ?

## 2022-02-18 ENCOUNTER — Encounter: Payer: Self-pay | Admitting: Family Medicine

## 2022-02-18 ENCOUNTER — Ambulatory Visit (INDEPENDENT_AMBULATORY_CARE_PROVIDER_SITE_OTHER): Payer: BC Managed Care – PPO | Admitting: Family Medicine

## 2022-02-18 VITALS — BP 130/70 | HR 95 | Temp 96.6°F

## 2022-02-18 DIAGNOSIS — J019 Acute sinusitis, unspecified: Secondary | ICD-10-CM | POA: Diagnosis not present

## 2022-02-18 MED ORDER — HYDROCODONE BIT-HOMATROP MBR 5-1.5 MG/5ML PO SOLN: ORAL | 0 refills | Status: DC

## 2022-02-18 MED ORDER — ALBUTEROL SULFATE HFA 108 (90 BASE) MCG/ACT IN AERS: 2.0000 | INHALATION_SPRAY | Freq: Four times a day (QID) | RESPIRATORY_TRACT | 2 refills | Status: DC | PRN

## 2022-02-18 NOTE — Progress Notes (Signed)
? ?  Subjective:  ? ? Patient ID: [name], female    DOB: [birth year], 61 y.o.   MRN: [id number] ? ?HPI ?Pt arriving with cough, chills, aches. Head cold, glassy eyes. No fever. Symptoms began last night/early this morning.  Patient with head congestion drainage coughing sinus pressure ? ? ?Review of Systems ? ?   ?Objective:  ? Physical Exam ? ?Gen-NAD not toxic ?TMS-normal bilateral ?T- normal no redness ?Chest-CTA respiratory rate normal no crackles ?CV RRR no murmur ?Skin-warm dry ?Neuro-grossly normal ? ? ? ?   ?Assessment & Plan:  ?Patient to do home COVID ?Supportive measures ?More than likely viral ?Follow-up if progressive troubles or problems ?Hold off on antibiotics currently but if not doing better over the next 7 days notify us ? ?

## 2022-02-19 MED ORDER — AMOXICILLIN 500 MG PO CAPS: ORAL_CAPSULE | ORAL | 0 refills | Status: DC

## 2022-02-19 NOTE — Telephone Encounter (Signed)
Nurses-May send them amoxicillin 500 mg 1 taken 3 times daily for 7 days ? ?I reviewed over the message ?As for the mucus not unusual for viruses to do this but we will go ahead with an antibiotic to cover for any possibility of secondary infection ?

## 2022-02-22 ENCOUNTER — Encounter: Payer: Self-pay | Admitting: Family Medicine

## 2022-02-23 ENCOUNTER — Ambulatory Visit (HOSPITAL_COMMUNITY)
Admission: EM | Admit: 2022-02-23 | Discharge: 2022-02-23 | Disposition: A | Discharge status: Home / Self Care | Payer: BC Managed Care – PPO | Attending: Physician Assistant | Admitting: Physician Assistant

## 2022-02-23 ENCOUNTER — Encounter (HOSPITAL_COMMUNITY): Payer: Self-pay | Admitting: Emergency Medicine

## 2022-02-23 DIAGNOSIS — J069 Acute upper respiratory infection, unspecified: Secondary | ICD-10-CM | POA: Diagnosis not present

## 2022-02-23 MED ORDER — PREDNISONE 10 MG (21) PO TBPK: ORAL_TABLET | Freq: Every day | ORAL | 0 refills | Status: DC

## 2022-02-23 MED ORDER — PROMETHAZINE-DM 6.25-15 MG/5ML PO SYRP: 5.0000 mL | ORAL_SOLUTION | Freq: Four times a day (QID) | ORAL | 0 refills | Status: DC | PRN

## 2022-02-23 NOTE — ED Triage Notes (Signed)
Pt is present today with cough. Pt sx started one week ago

## 2022-02-23 NOTE — ED Provider Notes (Signed)
[place]    CSN: [id number] Arrival date & time: 02/23/22  1705      History   Chief Complaint Chief Complaint  Patient presents with   Cough    Wheezing - colored sputum;Currently on amoxicillin since Tuesday 5/16; Inhaler and nebulizerPCP Dr Sallee Lange -In my option need steroid shot; No fever - Covid Negative . - Entered by patient    HPI [name] is a 61 y.o. female.   Pt presents with persistent cough and chest tightness that started about one week ago.  She was seen by PCP and prescribed amoxicllin which she is still on.  She reports with coughing fits she has a hard time catching her breath after and feels short of breath.  She has been using a neb treatment and inhaler as needed with temporary relief.  Pt is having trouble sleeping due to cough.  She denies fever, chills, n/v/d, body aches.  Her daughter is getting married next weekend and she is feeling anxious about making it to the wedding.    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   Hyperlipidemia LDL goal <130 07/21/2020   Pain of left upper extremity 06/15/2020   Localized bacterial skin infection 05/19/2020   Lipoma of shoulder 04/03/2015   Gall stones 03/17/2014   Esophageal reflux 07/31/2013   Chest wall mass, left 04/13/2013   Left breast mass 01/20/2013   COUGH 02/07/2009    Past Surgical History:  Procedure Laterality Date   BREAST BIOPSY Left 07/15/2017   2 masses   BUNIONECTOMY  08/2021   COLONOSCOPY N/A 06/09/2014   Procedure: COLONOSCOPY;  Surgeon: Rogene Houston, MD;  Location: [facility];   Service: Endoscopy;  Laterality: N/A;  830-moved to 945 Ann to notify pt   LAPAROSCOPIC TOTAL HYSTERECTOMY  07/18/2010   RSO   NECK SURGERY  11/2012   c6 c7    OB History     Gravida  2   Para  2   Term      Preterm      AB      Living         SAB      IAB      Ectopic      Multiple      Live Births               Home Medications    Prior to Admission medications   Medication Sig Start Date End Date Taking? Authorizing Provider  predniSONE (STERAPRED UNI-PAK 21 TAB) 10 MG (21) TBPK tablet Take by mouth daily. Take 6 tabs by mouth daily  for 2 days, then 5 tabs for 2 days, then 4 tabs for 2 days, then 3 tabs for 2 days, 2 tabs for 2 days, then 1 tab by mouth daily for 2 days 02/23/22  Yes Ward, Lenise Arena, PA-C  promethazine-dextromethorphan (PROMETHAZINE-DM) 6.25-15 MG/5ML syrup Take 5 mLs by mouth 4 (four) times daily as needed for cough. 02/23/22  Yes Ward, Lenise Arena, PA-C  albuterol (VENTOLIN HFA) 108 (90 Base)  MCG/ACT inhaler Inhale 2 puffs into the lungs every 6 (six) hours as needed for wheezing or shortness of breath. 02/18/22   Kathyrn Drown, MD  amoxicillin (AMOXIL) 500 MG capsule Take one capsule po TID for 7 days 02/19/22   Kathyrn Drown, MD  Biotin 5 MG TABS Take by mouth.    [provider]  Cholecalciferol (VITAMIN D3 PO) Take by mouth.    [provider]  citalopram (CELEXA) 20 MG tablet TAKE ONE (1) TABLET BY MOUTH EVERY DAY 12/26/21   Kathyrn Drown, MD  cyclobenzaprine (FLEXERIL) 5 MG tablet Take 1 tablet (5 mg total) by mouth 3 (three) times daily as needed for muscle spasms. 10/03/21   Wallene Huh, DPM  loratadine (CLARITIN) 10 MG tablet Take 10 mg by mouth daily.    [provider]  pantoprazole (PROTONIX) 40 MG tablet Take 1 tablet (40 mg total) by mouth daily. 12/07/21   Kathyrn Drown, MD  rosuvastatin (CRESTOR) 5 MG tablet TAKE ONE TABLET BY MOUTH ON MONDAYS, WEDNESDAYS, AND FRIDAYS FOR CHOLESTEROL 12/26/21    Kathyrn Drown, MD  vitamin C (ASCORBIC ACID) 500 MG tablet Take 500 mg by mouth daily.    [provider]    Family History Family History  Problem Relation Age of Onset   Heart disease Mother    Prostate cancer Father    Breast cancer Sister 37   Prostate cancer Brother    Colon cancer Neg Hx    Ovarian cancer Neg Hx    Endometrial cancer Neg Hx    Pancreatic cancer Neg Hx     Social History Social History   Tobacco Use   Smoking status: Never   Smokeless tobacco: Never  Vaping Use   Vaping Use: Never used  Substance Use Topics   Alcohol use: Yes    Comment: 2-3 times/month   Drug use: No     Allergies   Patient has no known allergies.   Review of Systems Review of Systems  Constitutional:  Negative for chills and fever.  HENT:  Positive for postnasal drip. Negative for ear pain and sore throat.   Eyes:  Negative for pain and visual disturbance.  Respiratory:  Positive for cough and shortness of breath.   Cardiovascular:  Negative for chest pain and palpitations.  Gastrointestinal:  Negative for abdominal pain and vomiting.  Genitourinary:  Negative for dysuria and hematuria.  Musculoskeletal:  Negative for arthralgias and back pain.  Skin:  Negative for color change and rash.  Neurological:  Negative for seizures and syncope.  All other systems reviewed and are negative.   Physical Exam Triage Vital Signs ED Triage Vitals [02/23/22 1757]  Enc Vitals Group     BP (!) 148/74     Pulse Rate 96     Resp 17     Temp 98.3 F (36.8 C)     Temp src      SpO2 95 %     Weight      Height      Head Circumference      Peak Flow      Pain Score 0     Pain Loc      Pain Edu?      Excl. in [place]?    No data found.  Updated Vital Signs BP (!) 148/74   Pulse 96   Temp 98.3 F (36.8 C)   Resp 17   SpO2 95%   Visual Acuity  Right Eye Distance:   Left Eye Distance:   Bilateral Distance:    Right Eye Near:   Left Eye Near:    Bilateral  Near:     Physical Exam Vitals and nursing note reviewed.  Constitutional:      General: She is not in acute distress.    Appearance: She is well-developed.  HENT:     Head: Normocephalic and atraumatic.     Comments: Voice hoarseness  Eyes:     Conjunctiva/sclera: Conjunctivae normal.  Cardiovascular:     Rate and Rhythm: Normal rate and regular rhythm.     Heart sounds: No murmur heard. Pulmonary:     Effort: Pulmonary effort is normal. No respiratory distress.     Breath sounds: Normal breath sounds.  Abdominal:     Palpations: Abdomen is soft.     Tenderness: There is no abdominal tenderness.  Musculoskeletal:        General: No swelling.     Cervical back: Neck supple.  Skin:    General: Skin is warm and dry.     Capillary Refill: Capillary refill takes less than 2 seconds.  Neurological:     Mental Status: She is alert.  Psychiatric:        Mood and Affect: Mood normal.     UC Treatments / Results  Labs (all labs ordered are listed, but only abnormal results are displayed) Labs Reviewed - No data to display  EKG   Radiology No results found.  Procedures Procedures (including critical care time)  Medications Ordered in UC Medications - No data to display  Initial Impression / Assessment and Plan / UC Course  I have reviewed the triage vital signs and the nursing notes.  Pertinent labs & imaging results that were available during my care of the patient were reviewed by me and considered in my medical decision making (see chart for details).    Will prescribe prednisone dosepak and cough syrup for persistent cough.  She will continue with inhaler as needed. Return precautions discussed.  Lungs clear on exam, pt overall well appearing.  Final Clinical Impressions(s) / UC Diagnoses   Final diagnoses:  Viral URI with cough     Discharge Instructions      Take prednisone as prescribed Can take cough syrup as needed  Continue with Mucinex Recommend  rest, drink plenty of fluids    ED Prescriptions     Medication Sig Dispense Auth. Provider   predniSONE (STERAPRED UNI-PAK 21 TAB) 10 MG (21) TBPK tablet Take by mouth daily. Take 6 tabs by mouth daily  for 2 days, then 5 tabs for 2 days, then 4 tabs for 2 days, then 3 tabs for 2 days, 2 tabs for 2 days, then 1 tab by mouth daily for 2 days 42 tablet Ward, Lenise Arena, PA-C   promethazine-dextromethorphan (PROMETHAZINE-DM) 6.25-15 MG/5ML syrup Take 5 mLs by mouth 4 (four) times daily as needed for cough. 118 mL Ward, Lenise Arena, PA-C      PDMP not reviewed this encounter.   Ward, Lenise Arena, PA-C 02/23/22 1821

## 2022-02-23 NOTE — Discharge Instructions (Addendum)
Take prednisone as prescribed Can take cough syrup as needed  Continue with Mucinex Recommend rest, drink plenty of fluids

## 2022-03-01 NOTE — Patient Instructions (Signed)
DUE TO COVID-19 ONLY TWO VISITORS  (aged 59 and older)  ARE ALLOWED TO COME WITH YOU AND STAY IN THE WAITING ROOM ONLY DURING PRE OP AND PROCEDURE.   **NO VISITORS ARE ALLOWED IN THE SHORT STAY AREA OR RECOVERY ROOM!!**   Your procedure is scheduled on: 03/12/22   Report to [facility] Main Entrance    Report to admitting at 5:15 AM   Call this number if you have problems the morning of surgery [phone]   Do not eat food :After Midnight.   After Midnight you may have the following liquids until 4:30 AM DAY OF SURGERY  Water Black Coffee (sugar ok, NO MILK/CREAM OR CREAMERS)  Tea (sugar ok, NO MILK/CREAM OR CREAMERS) regular and decaf                             Plain Jell-O (NO RED)                                           Fruit ices (not with fruit pulp, NO RED)                                     Popsicles (NO RED)                                                                  Juice: apple, WHITE grape, WHITE cranberry Sports drinks like Gatorade (NO RED) Clear broth(vegetable,chicken,beef)                The day of surgery:  Drink ONE (1) Pre-Surgery Clear Ensure at 4:30 AM the morning of surgery. Drink in one sitting. Do not sip.  This drink was given to you during your hospital  pre-op appointment visit. Nothing else to drink after completing the  Pre-Surgery Clear Ensure.          If you have questions, please contact your surgeon's office.   FOLLOW BOWEL PREP AND ANY ADDITIONAL PRE OP INSTRUCTIONS YOU RECEIVED FROM YOUR SURGEON'S OFFICE!!!     Oral Hygiene is also important to reduce your risk of infection.                                    Remember - BRUSH YOUR TEETH THE MORNING OF SURGERY WITH YOUR REGULAR TOOTHPASTE   Take these medicines the morning of surgery with A SIP OF WATER: Inhalers, Amoxicillin, Citalopram, Loratadine, Pantoprazole.                               You may not have any metal on your body including hair pins, jewelry, and body  piercing             Do not wear make-up, lotions, powders, perfumes, or deodorant  Do not wear nail polish including gel and S&S, artificial/acrylic nails, or any other type of covering on natural nails  including finger and toenails. If you have artificial nails, gel coating, etc. that needs to be removed by a nail salon please have this removed prior to surgery or surgery may need to be canceled/ delayed if the surgeon/ anesthesia feels like they are unable to be safely monitored.   Do not shave  48 hours prior to surgery.    Do not bring valuables to the hospital. [place].    Patients discharged on the day of surgery will not be allowed to drive home.  Someone NEEDS to stay with you for the first 24 hours after anesthesia.              Please read over the following fact sheets you were given: IF YOU HAVE QUESTIONS ABOUT YOUR PRE-OP INSTRUCTIONS PLEASE CALL [place] - Preparing for Surgery Before surgery, you can play an important role.  Because skin is not sterile, your skin needs to be as free of germs as possible.  You can reduce the number of germs on your skin by washing with CHG (chlorahexidine gluconate) soap before surgery.  CHG is an antiseptic cleaner which kills germs and bonds with the skin to continue killing germs even after washing. Please DO NOT use if you have an allergy to CHG or antibacterial soaps.  If your skin becomes reddened/irritated stop using the CHG and inform your nurse when you arrive at Short Stay. Do not shave (including legs and underarms) for at least 48 hours prior to the first CHG shower.  You may shave your face/neck.  Please follow these instructions carefully:  1.  Shower with CHG Soap the night before surgery and the  morning of surgery.  2.  If you choose to wash your hair, wash your hair first as usual with your normal  shampoo.  3.  After you shampoo, rinse your hair and  body thoroughly to remove the shampoo.                             4.  Use CHG as you would any other liquid soap.  You can apply chg directly to the skin and wash.  Gently with a scrungie or clean washcloth.  5.  Apply the CHG Soap to your body ONLY FROM THE NECK DOWN.   Do   not use on face/ open                           Wound or open sores. Avoid contact with eyes, ears mouth and   genitals (private parts).                       Wash face,  Genitals (private parts) with your normal soap.             6.  Wash thoroughly, paying special attention to the area where your    surgery  will be performed.  7.  Thoroughly rinse your body with warm water from the neck down.  8.  DO NOT shower/wash with your normal soap after using and rinsing off the CHG Soap.                9.  Pat yourself dry with a clean towel.  10.  Wear clean pajamas.            11.  Place clean sheets on your bed the night of your first shower and do not  sleep with pets. Day of Surgery : Do not apply any lotions/deodorants the morning of surgery.  Please wear clean clothes to the hospital/surgery center.  FAILURE TO FOLLOW THESE INSTRUCTIONS MAY RESULT IN THE CANCELLATION OF YOUR SURGERY  PATIENT SIGNATURE_________________________________  NURSE SIGNATURE__________________________________  ________________________________________________________________________  WHAT IS A BLOOD TRANSFUSION? Blood Transfusion Information  A transfusion is the replacement of blood or some of its parts. Blood is made up of multiple cells which provide different functions. Red blood cells carry oxygen and are used for blood loss replacement. White blood cells fight against infection. Platelets control bleeding. Plasma helps clot blood. Other blood products are available for specialized needs, such as hemophilia or other clotting disorders. BEFORE THE TRANSFUSION  Who gives blood for transfusions?  Healthy volunteers who are  fully evaluated to make sure their blood is safe. This is blood bank blood. Transfusion therapy is the safest it has ever been in the practice of medicine. Before blood is taken from a donor, a complete history is taken to make sure that person has no history of diseases nor engages in risky social behavior (examples are intravenous drug use or sexual activity with multiple partners). The donor's travel history is screened to minimize risk of transmitting infections, such as malaria. The donated blood is tested for signs of infectious diseases, such as HIV and hepatitis. The blood is then tested to be sure it is compatible with you in order to minimize the chance of a transfusion reaction. If you or a relative donates blood, this is often done in anticipation of surgery and is not appropriate for emergency situations. It takes many days to process the donated blood. RISKS AND COMPLICATIONS Although transfusion therapy is very safe and saves many lives, the main dangers of transfusion include:  Getting an infectious disease. Developing a transfusion reaction. This is an allergic reaction to something in the blood you were given. Every precaution is taken to prevent this. The decision to have a blood transfusion has been considered carefully by your caregiver before blood is given. Blood is not given unless the benefits outweigh the risks. AFTER THE TRANSFUSION Right after receiving a blood transfusion, you will usually feel much better and more energetic. This is especially true if your red blood cells have gotten low (anemic). The transfusion raises the level of the red blood cells which carry oxygen, and this usually causes an energy increase. The nurse administering the transfusion will monitor you carefully for complications. HOME CARE INSTRUCTIONS  No special instructions are needed after a transfusion. You may find your energy is better. Speak with your caregiver about any limitations on activity for  underlying diseases you may have. SEEK MEDICAL CARE IF:  Your condition is not improving after your transfusion. You develop redness or irritation at the intravenous (IV) site. SEEK IMMEDIATE MEDICAL CARE IF:  Any of the following symptoms occur over the next 12 hours: Shaking chills. You have a temperature by mouth above 102 F (38.9 C), not controlled by medicine. Chest, back, or muscle pain. People around you feel you are not acting correctly or are confused. Shortness of breath or difficulty breathing. Dizziness and fainting. You get a rash or develop hives. You have a decrease in urine output. Your urine turns a dark color or changes to pink,  red, or brown. Any of the following symptoms occur over the next 10 days: You have a temperature by mouth above 102 F (38.9 C), not controlled by medicine. Shortness of breath. Weakness after normal activity. The white part of the eye turns yellow (jaundice). You have a decrease in the amount of urine or are urinating less often. Your urine turns a dark color or changes to pink, red, or brown. Document Released: 09/20/2000 Document Revised: 12/16/2011 Document Reviewed: 05/09/2008 [facility] Patient Information 2014 [place], [place].  _______________________________________________________________________

## 2022-03-01 NOTE — Progress Notes (Signed)
COVID Vaccine Completed: yes x3  Date of COVID positive in last 90 days:  PCP - Sallee Lange, MD Cardiologist -   Medical clearance by Sallee Lange 02/15/22 in Epic   Chest x-ray - 12/05/21 Epic EKG -  Stress Test -  ECHO -  Cardiac Cath -  Pacemaker/ICD device last checked: Spinal Cord Stimulator:  Bowel Prep -   Sleep Study -  CPAP -   Fasting Blood Sugar -  Checks Blood Sugar _____ times a day  Blood Thinner Instructions: Aspirin Instructions: Last Dose:  Activity level:  Can go up a flight of stairs and perform activities of daily living without stopping and without symptoms of chest pain or shortness of breath.   Able to exercise without symptoms  Unable to go up a flight of stairs without symptoms of      Anesthesia review:   Patient denies shortness of breath, fever, cough and chest pain at PAT appointment   Patient verbalized understanding of instructions that were given to them at the PAT appointment. Patient was also instructed that they will need to review over the PAT instructions again at home before surgery.

## 2022-03-05 ENCOUNTER — Encounter (HOSPITAL_COMMUNITY): Payer: Self-pay

## 2022-03-05 ENCOUNTER — Encounter (HOSPITAL_COMMUNITY)
Admission: RE | Admit: 2022-03-05 | Discharge: 2022-03-05 | Disposition: A | Discharge status: Home / Self Care | Payer: BC Managed Care – PPO | Source: Ambulatory Visit | Attending: Gynecologic Oncology | Admitting: Gynecologic Oncology

## 2022-03-05 ENCOUNTER — Encounter: Payer: Self-pay | Admitting: Family Medicine

## 2022-03-05 DIAGNOSIS — R1900 Intra-abdominal and pelvic swelling, mass and lump, unspecified site: Secondary | ICD-10-CM | POA: Diagnosis not present

## 2022-03-05 DIAGNOSIS — Z01812 Encounter for preprocedural laboratory examination: Secondary | ICD-10-CM | POA: Insufficient documentation

## 2022-03-05 HISTORY — DX: Pneumonia, unspecified organism: J18.9

## 2022-03-05 LAB — CBC
HCT: 40.9 % (ref 36.0–46.0)
Hemoglobin: 13.6 g/dL (ref 12.0–15.0)
MCH: 29.4 pg (ref 26.0–34.0)
MCHC: 33.3 g/dL (ref 30.0–36.0)
MCV: 88.3 fL (ref 80.0–100.0)
Platelets: 336 10*3/uL (ref 150–400)
RBC: 4.63 MIL/uL (ref 3.87–5.11)
RDW: 14 % (ref 11.5–15.5)
WBC: 7.4 10*3/uL (ref 4.0–10.5)
nRBC: 0 % (ref 0.0–0.2)

## 2022-03-05 LAB — COMPREHENSIVE METABOLIC PANEL
ALT: 19 U/L (ref 0–44)
AST: 14 U/L — ABNORMAL LOW (ref 15–41)
Albumin: 3.9 g/dL (ref 3.5–5.0)
Alkaline Phosphatase: 51 U/L (ref 38–126)
Anion gap: 7 (ref 5–15)
BUN: 20 mg/dL (ref 6–20)
CO2: 27 mmol/L (ref 22–32)
Calcium: 8.7 mg/dL — ABNORMAL LOW (ref 8.9–10.3)
Chloride: 107 mmol/L (ref 98–111)
Creatinine, Ser: 0.8 mg/dL (ref 0.44–1.00)
GFR, Estimated: >60 mL/min (ref >60)
Glucose, Bld: 90 mg/dL (ref 70–99)
Potassium: 4 mmol/L (ref 3.5–5.1)
Sodium: 141 mmol/L (ref 135–145)
Total Bilirubin: 0.7 mg/dL (ref 0.3–1.2)
Total Protein: 6.9 g/dL (ref 6.5–8.1)

## 2022-03-06 ENCOUNTER — Encounter: Payer: Self-pay | Admitting: Gynecologic Oncology

## 2022-03-06 ENCOUNTER — Telehealth: Payer: Self-pay

## 2022-03-06 NOTE — Telephone Encounter (Signed)
I spoke to pt regarding her upcoming pre-op appointment with Joylene John NP on 6/5. Meaningful use was done. I also followed up  with pt from her recent visit to urgent care where e was diagnosed with an upper respiratory infection. She states she is much better and has finished her antibiotic.

## 2022-03-11 ENCOUNTER — Inpatient Hospital Stay: Payer: BC Managed Care – PPO | Attending: Gynecologic Oncology | Admitting: Gynecologic Oncology

## 2022-03-11 ENCOUNTER — Other Ambulatory Visit: Payer: Self-pay

## 2022-03-11 ENCOUNTER — Telehealth: Payer: Self-pay

## 2022-03-11 VITALS — BP 125/59 | HR 86 | Temp 98.5°F | Resp 18 | Ht 66.0 in | Wt 214.0 lb

## 2022-03-11 DIAGNOSIS — Z90721 Acquired absence of ovaries, unilateral: Secondary | ICD-10-CM | POA: Insufficient documentation

## 2022-03-11 DIAGNOSIS — R1900 Intra-abdominal and pelvic swelling, mass and lump, unspecified site: Secondary | ICD-10-CM

## 2022-03-11 DIAGNOSIS — C7962 Secondary malignant neoplasm of left ovary: Secondary | ICD-10-CM | POA: Insufficient documentation

## 2022-03-11 DIAGNOSIS — C19 Malignant neoplasm of rectosigmoid junction: Secondary | ICD-10-CM | POA: Insufficient documentation

## 2022-03-11 DIAGNOSIS — Z79899 Other long term (current) drug therapy: Secondary | ICD-10-CM | POA: Insufficient documentation

## 2022-03-11 MED ORDER — SENNOSIDES-DOCUSATE SODIUM 8.6-50 MG PO TABS: 2.0000 | ORAL_TABLET | Freq: Every day | ORAL | 0 refills | Status: DC

## 2022-03-11 MED ORDER — OXYCODONE HCL 5 MG PO TABS: 5.0000 mg | ORAL_TABLET | ORAL | 0 refills | Status: DC | PRN

## 2022-03-11 MED ORDER — IBUPROFEN 800 MG PO TABS: 800.0000 mg | ORAL_TABLET | Freq: Three times a day (TID) | ORAL | 0 refills | Status: DC | PRN

## 2022-03-11 NOTE — Anesthesia Preprocedure Evaluation (Signed)
Anesthesia Evaluation  Patient identified by MRN, date of birth, ID band Patient awake    Reviewed: Allergy & Precautions, NPO status , Patient's Chart, lab work & pertinent test results  Airway Mallampati: II  TM Distance: >3 FB Neck ROM: Full    Dental no notable dental hx.    Pulmonary neg pulmonary ROS,    Pulmonary exam normal        Cardiovascular negative cardio ROS   Rhythm:Regular Rate:Normal     Neuro/Psych negative neurological ROS  negative psych ROS   GI/Hepatic Neg liver ROS, GERD  Medicated,  Endo/Other  negative endocrine ROS  Renal/GU negative Renal ROS  Female GU complaint     Musculoskeletal negative musculoskeletal ROS (+)   Abdominal Normal abdominal exam  (+)   Peds  Hematology negative hematology ROS (+)   Anesthesia Other Findings   Reproductive/Obstetrics                            Anesthesia Physical Anesthesia Plan  ASA: 2  Anesthesia Plan: General   Post-op Pain Management: Celebrex PO (pre-op)*, Tylenol PO (pre-op)* and Gabapentin PO (pre-op)*   Induction: Intravenous  PONV Risk Score and Plan: 3 and Ondansetron, Dexamethasone, Midazolam and Treatment may vary due to age or medical condition  Airway Management Planned: Mask and Oral ETT  Additional Equipment: None  Intra-op Plan:   Post-operative Plan: Extubation in OR  Informed Consent: I have reviewed the patients History and Physical, chart, labs and discussed the procedure including the risks, benefits and alternatives for the proposed anesthesia with the patient or authorized representative who has indicated his/her understanding and acceptance.     Dental advisory given  Plan Discussed with: CRNA  Anesthesia Plan Comments: (Lab Results      Component                Value               Date                      WBC                      7.4                 03/05/2022                 HGB                      13.6                03/05/2022                HCT                      40.9                03/05/2022                MCV                      88.3                03/05/2022                PLT  336                 03/05/2022           Lab Results      Component                Value               Date                      NA                       141                 03/05/2022                K                        4.0                 03/05/2022                CO2                      27                  03/05/2022                GLUCOSE                  90                  03/05/2022                BUN                      20                  03/05/2022                CREATININE               0.80                03/05/2022                CALCIUM                  8.7 (L)             03/05/2022                GFRNONAA                 >60                 03/05/2022          )       Anesthesia Quick Evaluation

## 2022-03-11 NOTE — Progress Notes (Signed)
Patient here with her husband for a pre-operative appointment prior to her scheduled surgery on March 12, 2022. She is scheduled for robotic assisted unilateral salpingo-oophorectomy, possible staging if a cancer is identified, possible laparotomy. She had her pre-admission testing appointment on 5/30 at [facility]. The surgery was discussed in detail. See after visit summary for additional details. Visual aids used to discuss items related to surgery including the incentive spirometer (given to patient), sequential compression stockings, foley catheter, IV pump, multi-modal pain regimen including tylenol, photo of the surgical robot, female reproductive system to discuss surgery in detail.      Discussed post-op pain management in detail including the aspects of the enhanced recovery pathway.  Advised her that a new prescription would be sent in for oxycodone and it is only to be used for after her upcoming surgery.  We discussed the use of tylenol post-op and to monitor for a maximum of 4,000 mg in a 24 hour period.  Also prescribed sennakot to be used after surgery and to hold if having loose stools.  Discussed bowel regimen in detail.     Discussed the use of SCDs and measures to take at home to prevent DVT including frequent mobility.  Reportable signs and symptoms of DVT discussed. Post-operative instructions discussed and expectations for after surgery. Incisional care discussed as well including reportable signs and symptoms including erythema, drainage, wound separation.     10 minutes spent with the patient.  Verbalizing understanding of material discussed. No needs or concerns voiced at the end of the visit.   Advised patient and family to call for any needs.  Advised that her post-operative medications had been prescribed and could be picked up at any time.    This appointment is included in the global surgical bundle as pre-operative teaching and has no charge.

## 2022-03-11 NOTE — Telephone Encounter (Signed)
Pt in office for pre-op with Joylene John NP.  Patient compliant with pre-operative instructions.  Reinforced nothing to eat after midnight. Clear liquids until 4:30am. Patient to arrive at 5:15am.  No questions or concerns voiced.  Instructed to call for any needs.

## 2022-03-11 NOTE — Patient Instructions (Addendum)
Preparing for your Surgery  Plan for surgery on March 12, 2022 with Dr. Jeral Pinch at [place] will be scheduled for robotic assisted unilateral salpingo-oophorectomy (removal of one ovary and fallopian tube), possible staging if a cancer is identified, possible laparotomy (larger incision if needed).   Pre-operative Testing -(DONE) You will receive a phone call from presurgical testing at [facility] to arrange for a pre-operative appointment and lab work.  -Bring your insurance card, copy of an advanced directive if applicable, medication list  -At that visit, you will be asked to sign a consent for a possible blood transfusion in case a transfusion becomes necessary during surgery.  The need for a blood transfusion is rare but having consent is a necessary part of your care.     -You should not be taking blood thinners or aspirin at least ten days prior to surgery unless instructed by your surgeon.  -Do not take supplements such as fish oil (omega 3), red yeast rice, turmeric before your surgery. You want to avoid medications with aspirin in them including headache powders such as BC or Goody's), Excedrin migraine.  Day Before Surgery at [place] will be asked to take in a light diet the day before surgery. You will be advised you can have clear liquids up until 3 hours before your surgery.    Eat a light diet the day before surgery.  Examples including soups, broths, toast, yogurt, mashed potatoes.  AVOID GAS PRODUCING FOODS. Things to avoid include carbonated beverages (fizzy beverages, sodas), raw fruits and raw vegetables (uncooked), or beans.   If your bowels are filled with gas, your surgeon will have difficulty visualizing your pelvic organs which increases your surgical risks.  Your role in recovery Your role is to become active as soon as directed by your doctor, while still giving yourself time to heal.  Rest when you feel tired. You will be asked to  do the following in order to speed your recovery:  - Cough and breathe deeply. This helps to clear and expand your lungs and can prevent pneumonia after surgery.  - [place]. Do mild physical activity. Walking or moving your legs help your circulation and body functions return to normal. Do not try to get up or walk alone the first time after surgery.   -If you develop swelling on one leg or the other, pain in the back of your leg, redness/warmth in one of your legs, please call the office or go to the Emergency Room to have a doppler to rule out a blood clot. For shortness of breath, chest pain-seek care in the Emergency Room as soon as possible. - Actively manage your pain. Managing your pain lets you move in comfort. We will ask you to rate your pain on a scale of zero to 10. It is your responsibility to tell your doctor or nurse where and how much you hurt so your pain can be treated.  Special Considerations -If you are diabetic, you may be placed on insulin after surgery to have closer control over your blood sugars to promote healing and recovery.  This does not mean that you will be discharged on insulin.  If applicable, your oral antidiabetics will be resumed when you are tolerating a solid diet.  -Your final pathology results from surgery should be available around one week after surgery and the results will be relayed to you when available.  -Dr. Lahoma Crocker is the  surgeon that assists your GYN Oncologist with surgery.  If you end up staying the night, the next day after your surgery you will either see Dr. Berline Lopes or Dr. Lahoma Crocker.  -FMLA forms can be faxed to [phone] and please allow 5-7 business days for completion.  Pain Management After Surgery -You have been prescribed your pain medication and bowel regimen medications before surgery so that you can have these available when you are discharged from the hospital. The pain medication is for use  ONLY AFTER surgery and a new prescription will not be given.   -Make sure that you have Tylenol and Ibuprofen (TAKING IBUPROFEN WITH CELEXA CAN INCREASE YOU CHANCE OF HAVING A GASTROINTESTINAL BLEED SO WOULD USE IBUP SPARINGLY AND TAKE WITH FOOD) at home to use on a regular basis after surgery for pain control. We recommend alternating the medications every hour to six hours since they work differently and are processed in the body differently for pain relief.  -Review the attached handout on narcotic use and their risks and side effects.   Bowel Regimen -You have been prescribed Sennakot-S to take nightly to prevent constipation especially if you are taking the narcotic pain medication intermittently.  It is important to prevent constipation and drink adequate amounts of liquids. You can stop taking this medication when you are not taking pain medication and you are back on your normal bowel routine.  Risks of Surgery Risks of surgery are low but include bleeding, infection, damage to surrounding structures, re-operation, blood clots, and very rarely death.   Blood Transfusion Information (For the consent to be signed before surgery)  We will be checking your blood type before surgery so in case of emergencies, we will know what type of blood you would need.                                            WHAT IS A BLOOD TRANSFUSION?  A transfusion is the replacement of blood or some of its parts. Blood is made up of multiple cells which provide different functions. Red blood cells carry oxygen and are used for blood loss replacement. White blood cells fight against infection. Platelets control bleeding. Plasma helps clot blood. Other blood products are available for specialized needs, such as hemophilia or other clotting disorders. BEFORE THE TRANSFUSION  Who gives blood for transfusions?  You may be able to donate blood to be used at a later date on yourself (autologous donation). Relatives  can be asked to donate blood. This is generally not any safer than if you have received blood from a stranger. The same precautions are taken to ensure safety when a relative's blood is donated. Healthy volunteers who are fully evaluated to make sure their blood is safe. This is blood bank blood. Transfusion therapy is the safest it has ever been in the practice of medicine. Before blood is taken from a donor, a complete history is taken to make sure that person has no history of diseases nor engages in risky social behavior (examples are intravenous drug use or sexual activity with multiple partners). The donor's travel history is screened to minimize risk of transmitting infections, such as malaria. The donated blood is tested for signs of infectious diseases, such as HIV and hepatitis. The blood is then tested to be sure it is compatible with you in order to minimize the chance  of a transfusion reaction. If you or a relative donates blood, this is often done in anticipation of surgery and is not appropriate for emergency situations. It takes many days to process the donated blood. RISKS AND COMPLICATIONS Although transfusion therapy is very safe and saves many lives, the main dangers of transfusion include:  Getting an infectious disease. Developing a transfusion reaction. This is an allergic reaction to something in the blood you were given. Every precaution is taken to prevent this. The decision to have a blood transfusion has been considered carefully by your caregiver before blood is given. Blood is not given unless the benefits outweigh the risks.  AFTER SURGERY INSTRUCTIONS  Return to work: 4-6 weeks if applicable  Activity: 1. Be up and out of the bed during the day.  Take a nap if needed.  You may walk up steps but be careful and use the hand rail.  Stair climbing will tire you more than you think, you may need to stop part way and rest.   2. No lifting or straining for 6 weeks over 10  pounds. No pushing, pulling, straining for 6 weeks.  3. No driving for around 1 week(s).  Do not drive if you are taking narcotic pain medicine and make sure that your reaction time has returned.   4. You can shower as soon as the next day after surgery. Shower daily.  Use your regular soap and water (not directly on the incision) and pat your incision(s) dry afterwards; don't rub.  No tub baths or submerging your body in water until cleared by your surgeon. If you have the soap that was given to you by pre-surgical testing that was used before surgery, you do not need to use it afterwards because this can irritate your incisions.   5. No sexual activity and nothing in the vagina for 4 weeks.  6. You may experience a small amount of clear drainage from your incisions, which is normal.  If the drainage persists, increases, or changes color please call the office.  7. Do not use creams, lotions, or ointments such as neosporin on your incisions after surgery until advised by your surgeon because they can cause removal of the dermabond glue on your incisions.    8. Take Tylenol or ibuprofen first for pain if you are able to take these medications and only use narcotic pain medication for severe pain not relieved by the Tylenol or Ibuprofen.  Monitor your Tylenol intake to a max of 4,000 mg in a 24 hour period. You can alternate these medications after surgery.  Diet: 1. Low sodium Heart Healthy Diet is recommended but you are cleared to resume your normal (before surgery) diet after your procedure.  2. It is safe to use a laxative, such as Miralax or Colace, if you have difficulty moving your bowels. You have been prescribed Sennakot-S to take at bedtime every evening after surgery to keep bowel movements regular and to prevent constipation.    Wound Care: 1. Keep clean and dry.  Shower daily.  Reasons to call the Doctor: Fever - Oral temperature greater than 100.4 degrees  Fahrenheit Foul-smelling vaginal discharge Difficulty urinating Nausea and vomiting Increased pain at the site of the incision that is unrelieved with pain medicine. Difficulty breathing with or without chest pain New calf pain especially if only on one side Sudden, continuing increased vaginal bleeding with or without clots.   Contacts: For questions or concerns you should contact:  Dr. Jeral Pinch  at [place], NP at [phone]  After Hours: call [phone] and have the GYN Oncologist paged/contacted (after 5 pm or on the weekends).  Messages sent via mychart are for non-urgent matters and are not responded to after hours so for urgent needs, please call the after hours number.

## 2022-03-12 ENCOUNTER — Ambulatory Visit (HOSPITAL_COMMUNITY): Payer: BC Managed Care – PPO | Admitting: Anesthesiology

## 2022-03-12 ENCOUNTER — Ambulatory Visit (HOSPITAL_COMMUNITY)
Admission: RE | Admit: 2022-03-12 | Discharge: 2022-03-12 | Disposition: A | Discharge status: Home / Self Care | Payer: BC Managed Care – PPO | Source: Ambulatory Visit | Attending: Gynecologic Oncology | Admitting: Gynecologic Oncology

## 2022-03-12 ENCOUNTER — Encounter (HOSPITAL_COMMUNITY)
Admission: RE | Disposition: A | Discharge status: Home / Self Care | Payer: Self-pay | Source: Ambulatory Visit | Attending: Gynecologic Oncology

## 2022-03-12 ENCOUNTER — Encounter (HOSPITAL_COMMUNITY): Payer: Self-pay | Admitting: Gynecologic Oncology

## 2022-03-12 ENCOUNTER — Telehealth: Payer: Self-pay | Admitting: Gynecologic Oncology

## 2022-03-12 ENCOUNTER — Other Ambulatory Visit: Payer: Self-pay

## 2022-03-12 DIAGNOSIS — C7962 Secondary malignant neoplasm of left ovary: Secondary | ICD-10-CM

## 2022-03-12 DIAGNOSIS — K219 Gastro-esophageal reflux disease without esophagitis: Secondary | ICD-10-CM | POA: Diagnosis not present

## 2022-03-12 DIAGNOSIS — C801 Malignant (primary) neoplasm, unspecified: Secondary | ICD-10-CM

## 2022-03-12 DIAGNOSIS — C562 Malignant neoplasm of left ovary: Secondary | ICD-10-CM | POA: Diagnosis present

## 2022-03-12 DIAGNOSIS — R1900 Intra-abdominal and pelvic swelling, mass and lump, unspecified site: Secondary | ICD-10-CM | POA: Diagnosis not present

## 2022-03-12 HISTORY — PX: ROBOTIC ASSISTED SALPINGO OOPHERECTOMY: SHX6082

## 2022-03-12 HISTORY — DX: Malignant (primary) neoplasm, unspecified: C80.1

## 2022-03-12 LAB — TYPE AND SCREEN
ABO/RH(D): O POS
Antibody Screen: NEGATIVE

## 2022-03-12 SURGERY — SALPINGO-OOPHORECTOMY, ROBOT-ASSISTED
Anesthesia: General | Laterality: Left

## 2022-03-12 MED ORDER — PHENYLEPHRINE 80 MCG/ML (10ML) SYRINGE FOR IV PUSH (FOR BLOOD PRESSURE SUPPORT)
PREFILLED_SYRINGE | INTRAVENOUS | Status: DC | PRN
  Administered 2022-03-12: 160 ug via INTRAVENOUS
  Administered 2022-03-12 (×2): 80 ug via INTRAVENOUS

## 2022-03-12 MED ORDER — EPHEDRINE 5 MG/ML INJ
INTRAVENOUS | Status: AC
  Filled 2022-03-12: qty 5

## 2022-03-12 MED ORDER — ENSURE PRE-SURGERY PO LIQD: 296.0000 mL | Freq: Once | ORAL | Status: DC

## 2022-03-12 MED ORDER — CELECOXIB 200 MG PO CAPS
200.0000 mg | ORAL_CAPSULE | ORAL | Status: AC
  Administered 2022-03-12: 200 mg via ORAL
  Filled 2022-03-12: qty 1

## 2022-03-12 MED ORDER — SCOPOLAMINE 1 MG/3DAYS TD PT72
1.0000 | MEDICATED_PATCH | TRANSDERMAL | Status: DC
  Administered 2022-03-12: 1.5 mg via TRANSDERMAL
  Filled 2022-03-12: qty 1

## 2022-03-12 MED ORDER — LACTATED RINGERS IR SOLN
Status: DC | PRN
  Administered 2022-03-12: 1000 mL

## 2022-03-12 MED ORDER — STERILE WATER FOR IRRIGATION IR SOLN
Status: DC | PRN
  Administered 2022-03-12: 1000 mL

## 2022-03-12 MED ORDER — CHLORHEXIDINE GLUCONATE 4 % EX LIQD: 60.0000 mL | Freq: Once | CUTANEOUS | Status: DC

## 2022-03-12 MED ORDER — SUGAMMADEX SODIUM 200 MG/2ML IV SOLN
INTRAVENOUS | Status: DC | PRN
  Administered 2022-03-12: 200 mg via INTRAVENOUS

## 2022-03-12 MED ORDER — ACETAMINOPHEN 500 MG PO TABS
1000.0000 mg | ORAL_TABLET | ORAL | Status: AC
  Administered 2022-03-12: 1000 mg via ORAL
  Filled 2022-03-12: qty 2

## 2022-03-12 MED ORDER — ROCURONIUM BROMIDE 10 MG/ML (PF) SYRINGE
PREFILLED_SYRINGE | INTRAVENOUS | Status: AC
  Filled 2022-03-12: qty 10

## 2022-03-12 MED ORDER — PHENYLEPHRINE 80 MCG/ML (10ML) SYRINGE FOR IV PUSH (FOR BLOOD PRESSURE SUPPORT)
PREFILLED_SYRINGE | INTRAVENOUS | Status: AC
  Filled 2022-03-12: qty 10

## 2022-03-12 MED ORDER — HEPARIN SODIUM (PORCINE) 5000 UNIT/ML IJ SOLN
5000.0000 [IU] | INTRAMUSCULAR | Status: AC
  Administered 2022-03-12: 5000 [IU] via SUBCUTANEOUS
  Filled 2022-03-12: qty 1

## 2022-03-12 MED ORDER — DEXAMETHASONE SODIUM PHOSPHATE 10 MG/ML IJ SOLN
INTRAMUSCULAR | Status: DC | PRN
  Administered 2022-03-12: 10 mg via INTRAVENOUS

## 2022-03-12 MED ORDER — ROCURONIUM BROMIDE 10 MG/ML (PF) SYRINGE
PREFILLED_SYRINGE | INTRAVENOUS | Status: DC | PRN
  Administered 2022-03-12: 70 mg via INTRAVENOUS
  Administered 2022-03-12 (×2): 10 mg via INTRAVENOUS

## 2022-03-12 MED ORDER — LIDOCAINE 2% (20 MG/ML) 5 ML SYRINGE
INTRAMUSCULAR | Status: DC | PRN
  Administered 2022-03-12: 100 mg via INTRAVENOUS

## 2022-03-12 MED ORDER — ACETAMINOPHEN 500 MG PO TABS: 1000.0000 mg | ORAL_TABLET | Freq: Once | ORAL | Status: DC

## 2022-03-12 MED ORDER — FENTANYL CITRATE PF 50 MCG/ML IJ SOSY
PREFILLED_SYRINGE | INTRAMUSCULAR | Status: AC
  Filled 2022-03-12: qty 1

## 2022-03-12 MED ORDER — GABAPENTIN 300 MG PO CAPS
300.0000 mg | ORAL_CAPSULE | ORAL | Status: AC
  Administered 2022-03-12: 300 mg via ORAL
  Filled 2022-03-12: qty 1

## 2022-03-12 MED ORDER — EPHEDRINE SULFATE-NACL 50-0.9 MG/10ML-% IV SOSY
PREFILLED_SYRINGE | INTRAVENOUS | Status: DC | PRN
  Administered 2022-03-12 (×2): 10 mg via INTRAVENOUS

## 2022-03-12 MED ORDER — LACTATED RINGERS IV SOLN: INTRAVENOUS | Status: DC

## 2022-03-12 MED ORDER — PROPOFOL 10 MG/ML IV BOLUS
INTRAVENOUS | Status: AC
  Filled 2022-03-12: qty 20

## 2022-03-12 MED ORDER — FENTANYL CITRATE PF 50 MCG/ML IJ SOSY
25.0000 ug | PREFILLED_SYRINGE | INTRAMUSCULAR | Status: DC | PRN
  Administered 2022-03-12 (×3): 50 ug via INTRAVENOUS

## 2022-03-12 MED ORDER — DEXAMETHASONE SODIUM PHOSPHATE 10 MG/ML IJ SOLN
INTRAMUSCULAR | Status: AC
  Filled 2022-03-12: qty 1

## 2022-03-12 MED ORDER — CELECOXIB 200 MG PO CAPS: 200.0000 mg | ORAL_CAPSULE | Freq: Once | ORAL | Status: DC

## 2022-03-12 MED ORDER — FENTANYL CITRATE (PF) 250 MCG/5ML IJ SOLN
INTRAMUSCULAR | Status: AC
  Filled 2022-03-12: qty 5

## 2022-03-12 MED ORDER — ONDANSETRON HCL 4 MG/2ML IJ SOLN
INTRAMUSCULAR | Status: DC | PRN
  Administered 2022-03-12: 4 mg via INTRAVENOUS

## 2022-03-12 MED ORDER — MIDAZOLAM HCL 2 MG/2ML IJ SOLN
INTRAMUSCULAR | Status: AC
  Filled 2022-03-12: qty 2

## 2022-03-12 MED ORDER — CHLORHEXIDINE GLUCONATE 0.12 % MT SOLN
15.0000 mL | Freq: Once | OROMUCOSAL | Status: AC
  Administered 2022-03-12: 15 mL via OROMUCOSAL

## 2022-03-12 MED ORDER — BUPIVACAINE HCL 0.25 % IJ SOLN
INTRAMUSCULAR | Status: DC | PRN
  Administered 2022-03-12: 32 mL

## 2022-03-12 MED ORDER — FENTANYL CITRATE (PF) 250 MCG/5ML IJ SOLN
INTRAMUSCULAR | Status: DC | PRN
  Administered 2022-03-12: 50 ug via INTRAVENOUS
  Administered 2022-03-12: 100 ug via INTRAVENOUS
  Administered 2022-03-12: 50 ug via INTRAVENOUS

## 2022-03-12 MED ORDER — CEFAZOLIN SODIUM-DEXTROSE 2-4 GM/100ML-% IV SOLN
2.0000 g | INTRAVENOUS | Status: AC
  Administered 2022-03-12: 2 g via INTRAVENOUS
  Filled 2022-03-12: qty 100

## 2022-03-12 MED ORDER — DEXAMETHASONE SODIUM PHOSPHATE 4 MG/ML IJ SOLN: 4.0000 mg | INTRAMUSCULAR | Status: DC

## 2022-03-12 MED ORDER — LIDOCAINE HCL (PF) 2 % IJ SOLN
INTRAMUSCULAR | Status: AC
Start: 2022-03-12 — End: ?
  Filled 2022-03-12: qty 5

## 2022-03-12 MED ORDER — MIDAZOLAM HCL 2 MG/2ML IJ SOLN
INTRAMUSCULAR | Status: DC | PRN
  Administered 2022-03-12: 2 mg via INTRAVENOUS

## 2022-03-12 MED ORDER — ONDANSETRON HCL 4 MG/2ML IJ SOLN
INTRAMUSCULAR | Status: AC
  Filled 2022-03-12: qty 2

## 2022-03-12 MED ORDER — PROPOFOL 10 MG/ML IV BOLUS
INTRAVENOUS | Status: DC | PRN
  Administered 2022-03-12: 140 mg via INTRAVENOUS

## 2022-03-12 MED ORDER — ACETAMINOPHEN 500 MG PO TABS: 1000.0000 mg | ORAL_TABLET | ORAL | Status: DC

## 2022-03-12 MED ORDER — BUPIVACAINE HCL 0.25 % IJ SOLN
INTRAMUSCULAR | Status: AC
Start: 2022-03-12 — End: ?
  Filled 2022-03-12: qty 1

## 2022-03-12 MED ORDER — ORAL CARE MOUTH RINSE: 15.0000 mL | Freq: Once | OROMUCOSAL | Status: AC

## 2022-03-12 SURGICAL SUPPLY — 87 items
ADH SKN CLS APL DERMABOND .7 (GAUZE/BANDAGES/DRESSINGS)
AGENT HMST KT MTR STRL THRMB (HEMOSTASIS)
APL ESCP 34 STRL LF DISP (HEMOSTASIS)
APL SKNCLS STERI-STRIP NONHPOA (GAUZE/BANDAGES/DRESSINGS) ×2
APPLICATOR SURGIFLO ENDO (HEMOSTASIS) IMPLANT
BACTOSHIELD CHG 4% 4OZ (MISCELLANEOUS) ×1
BAG LAPAROSCOPIC 12 15 PORT 16 (BASKET) ×2 IMPLANT
BAG RETRIEVAL 10 (BASKET)
BAG RETRIEVAL 12/15 (BASKET) ×6
BENZOIN TINCTURE PRP APPL 2/3 (GAUZE/BANDAGES/DRESSINGS) ×2 IMPLANT
BLADE SURG SZ10 CARB STEEL (BLADE) IMPLANT
COVER BACK TABLE 60X90IN (DRAPES) ×1 IMPLANT
COVER TIP SHEARS 8 DVNC (MISCELLANEOUS) ×2 IMPLANT
COVER TIP SHEARS 8MM DA VINCI (MISCELLANEOUS) ×3
DERMABOND ADVANCED (GAUZE/BANDAGES/DRESSINGS)
DERMABOND ADVANCED .7 DNX12 (GAUZE/BANDAGES/DRESSINGS) ×1 IMPLANT
DRAPE ARM DVNC X/XI (DISPOSABLE) ×8 IMPLANT
DRAPE COLUMN DVNC XI (DISPOSABLE) ×2 IMPLANT
DRAPE DA VINCI XI ARM (DISPOSABLE) ×12
DRAPE DA VINCI XI COLUMN (DISPOSABLE) ×3
DRAPE SHEET LG 3/4 BI-LAMINATE (DRAPES) ×5 IMPLANT
DRAPE SURG IRRIG POUCH 19X23 (DRAPES) ×3 IMPLANT
DRSG OPSITE POSTOP 4X6 (GAUZE/BANDAGES/DRESSINGS) IMPLANT
DRSG OPSITE POSTOP 4X8 (GAUZE/BANDAGES/DRESSINGS) IMPLANT
DRSG TEGADERM 2-3/8X2-3/4 SM (GAUZE/BANDAGES/DRESSINGS) ×2 IMPLANT
ELECT PENCIL ROCKER SW 15FT (MISCELLANEOUS) IMPLANT
ELECT REM PT RETURN 15FT ADLT (MISCELLANEOUS) ×3 IMPLANT
GAUZE 4X4 16PLY ~~LOC~~+RFID DBL (SPONGE) ×5 IMPLANT
GAUZE SPONGE 2X2 8PLY STRL LF (GAUZE/BANDAGES/DRESSINGS) ×1 IMPLANT
GLOVE BIO SURGEON STRL SZ 6 (GLOVE) ×12 IMPLANT
GLOVE BIO SURGEON STRL SZ 6.5 (GLOVE) ×6 IMPLANT
GOWN STRL REUS W/ TWL LRG LVL3 (GOWN DISPOSABLE) ×8 IMPLANT
GOWN STRL REUS W/TWL LRG LVL3 (GOWN DISPOSABLE) ×12
GRASPER SUT TROCAR 14GX15 (MISCELLANEOUS) ×2 IMPLANT
HOLDER FOLEY CATH W/STRAP (MISCELLANEOUS) IMPLANT
IRRIG SUCT STRYKERFLOW 2 WTIP (MISCELLANEOUS) ×3
IRRIGATION SUCT STRKRFLW 2 WTP (MISCELLANEOUS) ×2 IMPLANT
KIT PROCEDURE DA VINCI SI (MISCELLANEOUS)
KIT PROCEDURE DVNC SI (MISCELLANEOUS) IMPLANT
KIT TURNOVER KIT A (KITS) IMPLANT
LIGASURE IMPACT 36 18CM CVD LR (INSTRUMENTS) IMPLANT
MANIPULATOR ADVINCU DEL 3.0 PL (MISCELLANEOUS) IMPLANT
MANIPULATOR ADVINCU DEL 3.5 PL (MISCELLANEOUS) IMPLANT
MANIPULATOR UTERINE 4.5 ZUMI (MISCELLANEOUS) IMPLANT
NDL HYPO 21X1.5 SAFETY (NEEDLE) ×1 IMPLANT
NDL SPNL 18GX3.5 QUINCKE PK (NEEDLE) IMPLANT
NEEDLE HYPO 21X1.5 SAFETY (NEEDLE) ×3 IMPLANT
NEEDLE SPNL 18GX3.5 QUINCKE PK (NEEDLE) IMPLANT
OBTURATOR OPTICAL STANDARD 8MM (TROCAR) ×3
OBTURATOR OPTICAL STND 8 DVNC (TROCAR) ×2
OBTURATOR OPTICALSTD 8 DVNC (TROCAR) ×2 IMPLANT
PACK ROBOT GYN CUSTOM WL (TRAY / TRAY PROCEDURE) ×3 IMPLANT
PAD POSITIONING PINK XL (MISCELLANEOUS) ×3 IMPLANT
PORT ACCESS TROCAR AIRSEAL 12 (TROCAR) ×2 IMPLANT
PORT ACCESS TROCAR AIRSEAL 5M (TROCAR) ×1
SCRUB CHG 4% DYNA-HEX 4OZ (MISCELLANEOUS) ×2 IMPLANT
SEAL CANN UNIV 5-8 DVNC XI (MISCELLANEOUS) ×8 IMPLANT
SEAL XI 5MM-8MM UNIVERSAL (MISCELLANEOUS) ×12
SEALER VESSEL DA VINCI XI (MISCELLANEOUS) ×3
SEALER VESSEL EXT DVNC XI (MISCELLANEOUS) ×1 IMPLANT
SET TRI-LUMEN FLTR TB AIRSEAL (TUBING) ×3 IMPLANT
SPIKE FLUID TRANSFER (MISCELLANEOUS) ×3 IMPLANT
SPONGE GAUZE 2X2 STER 10/PKG (GAUZE/BANDAGES/DRESSINGS) ×1
STRIP CLOSURE SKIN 1/2X4 (GAUZE/BANDAGES/DRESSINGS) ×2 IMPLANT
SURGIFLO W/THROMBIN 8M KIT (HEMOSTASIS) IMPLANT
SUT MNCRL AB 4-0 PS2 18 (SUTURE) IMPLANT
SUT PDS AB 1 TP1 96 (SUTURE) IMPLANT
SUT VIC AB 0 CT1 27 (SUTURE)
SUT VIC AB 0 CT1 27XBRD ANTBC (SUTURE) IMPLANT
SUT VIC AB 2-0 CT1 27 (SUTURE)
SUT VIC AB 2-0 CT1 TAPERPNT 27 (SUTURE) IMPLANT
SUT VIC AB 2-0 SH 27 (SUTURE) ×3
SUT VIC AB 2-0 SH 27X BRD (SUTURE) ×1 IMPLANT
SUT VIC AB 4-0 PS2 18 (SUTURE) ×6 IMPLANT
SUT VICRYL 0 UR6 27IN ABS (SUTURE) ×2 IMPLANT
SYR 10ML LL (SYRINGE) IMPLANT
SYS BAG RETRIEVAL 10MM (BASKET)
SYS WOUND ALEXIS 18CM MED (MISCELLANEOUS)
SYSTEM BAG RETRIEVAL 10MM (BASKET) IMPLANT
SYSTEM WOUND ALEXIS 18CM MED (MISCELLANEOUS) IMPLANT
TOWEL OR NON WOVEN STRL DISP B (DISPOSABLE) IMPLANT
TRAP SPECIMEN MUCUS 40CC (MISCELLANEOUS) ×2 IMPLANT
TRAY FOLEY MTR SLVR 16FR STAT (SET/KITS/TRAYS/PACK) ×3 IMPLANT
TROCAR Z-THREAD FIOS 5X100MM (TROCAR) IMPLANT
UNDERPAD 30X36 HEAVY ABSORB (UNDERPADS AND DIAPERS) ×6 IMPLANT
WATER STERILE IRR 1000ML POUR (IV SOLUTION) ×3 IMPLANT
YANKAUER SUCT BULB TIP 10FT TU (MISCELLANEOUS) IMPLANT

## 2022-03-12 NOTE — Transfer of Care (Signed)
Immediate Anesthesia Transfer of Care Note  Patient: [name]  Procedure(s) Performed: XI ROBOTIC ASSISTED LEFT SALPINGO OOPHORECTOMY WITH STAGING (Left)  Patient Location: PACU  Anesthesia Type:General  Level of Consciousness: awake  Airway & Oxygen Therapy: Patient Spontanous Breathing and Patient connected to face mask oxygen  Post-op Assessment: Report given to RN and Post -op Vital signs reviewed and stable  Post vital signs: Reviewed and stable  Last Vitals:  Vitals Value Taken Time  BP 136/67 03/12/22 1003  Temp    Pulse 92 03/12/22 1004  Resp 9 03/12/22 1004  SpO2 100 % 03/12/22 1004  Vitals shown include unvalidated device data.  Last Pain:  Vitals:   03/12/22 0549  TempSrc:   PainSc: 0-No pain         Complications: No notable events documented.

## 2022-03-12 NOTE — Anesthesia Postprocedure Evaluation (Signed)
Anesthesia Post Note  Patient: [name]  Procedure(s) Performed: XI ROBOTIC ASSISTED LEFT SALPINGO OOPHORECTOMY WITH STAGING (Left)     Patient location during evaluation: PACU Anesthesia Type: General Level of consciousness: awake and alert Pain management: pain level controlled Vital Signs Assessment: post-procedure vital signs reviewed and stable Respiratory status: spontaneous breathing, nonlabored ventilation, respiratory function stable and patient connected to nasal cannula oxygen Cardiovascular status: blood pressure returned to baseline and stable Postop Assessment: no apparent nausea or vomiting Anesthetic complications: no   No notable events documented.  Last Vitals:  Vitals:   03/12/22 1100 03/12/22 1200  BP: 134/66   Pulse: 83   Resp: 10 12  Temp: 36.5 C   SpO2: 93%     Last Pain:  Vitals:   03/12/22 1200  TempSrc:   PainSc: 0-No pain                 Belenda Cruise P Lela Gell

## 2022-03-12 NOTE — Telephone Encounter (Signed)
Called the patient.  Discussed findings from surgery.  Reviewed frozen section and procedures performed.  All questions answered.  Jeral Pinch MD Gynecologic Oncology

## 2022-03-12 NOTE — Anesthesia Procedure Notes (Signed)
Procedure Name: Intubation Date/Time: 03/12/2022 7:33 AM Performed by: Sharlette Dense, CRNA Patient Re-evaluated:Patient Re-evaluated prior to induction Oxygen Delivery Method: Circle system utilized Preoxygenation: Pre-oxygenation with 100% oxygen Induction Type: IV induction Ventilation: Mask ventilation without difficulty and Oral airway inserted - appropriate to patient size Laryngoscope Size: Sabra Heck and 2 Grade View: Grade I Tube type: Oral Tube size: 7.5 mm Number of attempts: 1 Airway Equipment and Method: Stylet Placement Confirmation: ETT inserted through vocal cords under direct vision, positive ETCO2 and breath sounds checked- equal and bilateral Secured at: 22 cm Tube secured with: Tape Dental Injury: Teeth and Oropharynx as per pre-operative assessment

## 2022-03-12 NOTE — H&P (Signed)
Gynecologic Oncology H&P  03/12/22  Treatment History: On March 9, after doing some heavier housework, the patient developed left lower quadrant pain that she describes as feeling "like she got shot".  Pain was constant, sharp, wraps around to her left back.  The pain was unrelenting and she ultimately went to the emergency department.  She had some associated nausea and emesis.  She spent about a half a day in the emergency department and had imaging performed.  CT of the abdomen and pelvis on 12/13/2021 shows a 6.6 cm hyperdense left adnexal mass, likely hemorrhagic cyst.  Follow-up ultrasound recommended.  Hyperenhancing lesion in the right hepatic lobe also noted, most likely differential includes angioma, FNH, adenoma.  MRI of the abdomen and follow-up on 4/10 shows an enhancing 13 mm hepatic lesion demonstrating nonspecific imaging characteristics, favored to reflect benign etiology.   After treatment with IV pain medication in the ER, pain subsided and has not returned.  She subsequently saw her primary care provider and then was referred to an OB/GYN. Pelvic MRI exam at [place] on 01/03/2022 shows complex mass within the left adnexa.  Adnexa measures 7.6 x 7.3 x 6.2 cm.  Internal debris noted within the cystic portion of the uterus, and internal blood flow noted within the solid portions and irregular edges described.   CA-125 was 9.7 on 01/03/22.  Interval History: Doing well. Recently treated with URI.  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   BMI 35.0-35.9,adult    GERD (gastroesophageal reflux disease)    Normal spontaneous vaginal delivery    2   Pneumonia     Past Surgical History:  Procedure Laterality Date   BREAST BIOPSY Left 07/15/2017   2 masses   BUNIONECTOMY  08/2021   COLONOSCOPY N/A 06/09/2014   Procedure: COLONOSCOPY;  Surgeon:  Rogene Houston, MD;  Location: [facility];  Service: Endoscopy;  Laterality: N/A;  830-moved to 945 Ann to notify pt   LAPAROSCOPIC TOTAL HYSTERECTOMY  07/18/2010   RSO   NECK SURGERY  11/2012   c6 c7    Family History  Problem Relation Age of Onset   Heart disease Mother    Prostate cancer Father    Breast cancer Sister 50   Prostate cancer Brother    Colon cancer Neg Hx    Ovarian cancer Neg Hx    Endometrial cancer Neg Hx    Pancreatic cancer Neg Hx     Social History   Socioeconomic History   Marital status: Married    Spouse name: Not on file   Number of children: Not on file   Years of education: Not on file   Highest education level: Not on file  Occupational History   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  Tobacco Use   Smoking status: Never   Smokeless tobacco: Never  Vaping Use   Vaping Use: Never used  Substance and Sexual Activity   Alcohol use: Yes    Comment: 2-3 times/month   Drug use: No   Sexual activity: Yes    Birth control/protection: Surgical  Other Topics Concern   Not on file  Social History Narrative   Not on file   Social Determinants of Health   Financial Resource Strain: Not on file  Food Insecurity: Not on file  Transportation Needs: Not on file  Physical Activity: Not on file  Stress: Not on file  Social Connections: Not on file    Current Medications:  Current Facility-Administered Medications:    ceFAZolin (ANCEF) IVPB 2g/100 mL premix, 2 g, Intravenous, On Call to OR, Clovis Riley, MD   Shower Chin To Toes With 60 mL chlorhexidine (HIBICLENS) the night before surgery, , , Once **AND** [START ON 03/13/2022] Shower Chin To Toes With 60 mL chlorhexidine (HIBICLENS) in AM of surgery after pre-op clip completed, , , Once **AND** [DISCONTINUED] chlorhexidine (HIBICLENS) 4 % liquid 4 application., 60 mL, Topical, Once **AND** chlorhexidine (HIBICLENS) 4 % liquid 4 application., 60 mL, Topical, Once, Connor, Chelsea A,  MD   dexamethasone (DECADRON) injection 4 mg, 4 mg, Intravenous, On Call to OR, Elinor Parkinson, Lenna Sciara D, NP   lactated ringers infusion, , Intravenous, Continuous, Nolon Nations, MD, Last Rate: 10 mL/hr at 03/12/22 0608, New Bag at 03/12/22 0608   scopolamine (TRANSDERM-SCOP) 1 MG/3DAYS 1.5 mg, 1 patch, Transdermal, On Call to OR, Cross, Melissa D, NP, 1.5 mg at 03/12/22 0551  Review of Systems: + weight gain Denies appetite changes, fevers, chills, fatigue. Denies hearing loss, neck lumps or masses, mouth sores, ringing in ears or voice changes. Denies cough or wheezing.  Denies shortness of breath. Denies chest pain or palpitations. Denies leg swelling. Denies abdominal distention, pain, blood in stools, constipation, diarrhea, nausea, vomiting, or early satiety. Denies pain with intercourse, dysuria, frequency, hematuria or incontinence. Denies hot flashes, pelvic pain, vaginal bleeding or vaginal discharge.   Denies joint pain, back pain or muscle pain/cramps. Denies itching, rash, or wounds. Denies dizziness, headaches, numbness or seizures. Denies swollen lymph nodes or glands, denies easy bruising or bleeding. Denies anxiety, depression, confusion, or decreased concentration.  Physical Exam: BP 130/69   Pulse 81   Temp 98.3 F (36.8 C) (Oral)   Resp 18   Ht '5\' 6"'$  (1.676 m)   Wt 214 lb 1.1 oz (97.1 kg)   SpO2 96%   BMI 34.55 kg/m  General: Alert, oriented, no acute distress.  HEENT: Normocephalic, atraumatic. Sclera anicteric.  Chest: Clear to auscultation bilaterally. No wheezes, rhonchi, or rales. Cardiovascular: Regular rate and rhythm, no murmurs, rubs, or gallops.  Abdomen: Obese. Normoactive bowel sounds. Soft, nondistended, nontender to palpation. No masses or hepatosplenomegaly appreciated. No palpable fluid wave.  Well-healed laparoscopic incisions. Extremities: Grossly normal range of motion. Warm, well perfused. No edema bilaterally.   Laboratory & Radiologic  Studies: CBC    Component Value Date/Time   WBC 7.4 03/05/2022 0922   RBC 4.63 03/05/2022 0922   HGB 13.6 03/05/2022 0922   HGB 14.2 06/16/2020 0957   HCT 40.9 03/05/2022 0922   HCT 43.5 06/16/2020 0957   PLT 336 03/05/2022 0922   MCV 88.3 03/05/2022 0922   MCV 88 06/16/2020 0957   MCH 29.4 03/05/2022 0922   MCHC 33.3 03/05/2022 0922   RDW 14.0 03/05/2022 0922   RDW 13.0 06/16/2020 0957   LYMPHSABS 1.7 12/13/2021 1608   LYMPHSABS 2.3 06/16/2020 0957   MONOABS 0.4 12/13/2021 1608   EOSABS 0.3 12/13/2021 1608   EOSABS 0.3 06/16/2020 0957   BASOSABS 0.0 12/13/2021 1608   BASOSABS 0.0 06/16/2020 0957      Latest Ref Rng & Units 03/05/2022    9:22 AM 12/13/2021    4:08 PM 06/16/2020    9:57 AM  BMP  Glucose 70 - 99 mg/dL 90   162   99    BUN 6 - 20 mg/dL 20   12  11    Creatinine 0.44 - 1.00 mg/dL 0.80   0.60   0.67    BUN/Creat Ratio 9 - 23   16    Sodium 135 - 145 mmol/L 141   140   140    Potassium 3.5 - 5.1 mmol/L 4.0   3.3   4.4    Chloride 98 - 111 mmol/L 107   109   103    CO2 22 - 32 mmol/L '27   24   24    '$ Calcium 8.9 - 10.3 mg/dL 8.7   8.8   9.7      Assessment & Plan: [name] is a 61 y.o. woman with complex mass.  Plan for definitive surgery today with robotic LSO. Plan for frozen section and staging if indicated by pathology.  Jeral Pinch, MD  Division of Gynecologic Oncology  Department of Obstetrics and Gynecology  [facility]

## 2022-03-12 NOTE — Discharge Instructions (Signed)
AFTER SURGERY INSTRUCTIONS   Return to work: 4-6 weeks if applicable   Activity: 1. Be up and out of the bed during the day.  Take a nap if needed.  You may walk up steps but be careful and use the hand rail.  Stair climbing will tire you more than you think, you may need to stop part way and rest.    2. No lifting or straining for 6 weeks over 10 pounds. No pushing, pulling, straining for 6 weeks.   3. No driving for around 1 week(s).  Do not drive if you are taking narcotic pain medicine and make sure that your reaction time has returned.    4. You can shower as soon as the next day after surgery. Shower daily.  Use your regular soap and water (not directly on the incision) and pat your incision(s) dry afterwards; don't rub.  No tub baths or submerging your body in water until cleared by your surgeon. If you have the soap that was given to you by pre-surgical testing that was used before surgery, you do not need to use it afterwards because this can irritate your incisions.    5. No sexual activity and nothing in the vagina for 4 weeks.   6. You may experience a small amount of clear drainage from your incisions, which is normal.  If the drainage persists, increases, or changes color please call the office.   7. Do not use creams, lotions, or ointments such as neosporin on your incisions after surgery until advised by your surgeon because they can cause removal of the dermabond glue on your incisions.     8. Take Tylenol or ibuprofen first for pain if you are able to take these medications and only use narcotic pain medication for severe pain not relieved by the Tylenol or Ibuprofen.  Monitor your Tylenol intake to a max of 4,000 mg in a 24 hour period. You can alternate these medications after surgery.   Diet: 1. Low sodium Heart Healthy Diet is recommended but you are cleared to resume your normal (before surgery) diet after your procedure.   2. It is safe to use a laxative, such as  Miralax or Colace, if you have difficulty moving your bowels. You have been prescribed Sennakot-S to take at bedtime every evening after surgery to keep bowel movements regular and to prevent constipation.     Wound Care: 1. Keep clean and dry.  Shower daily.   Reasons to call the Doctor: Fever - Oral temperature greater than 100.4 degrees Fahrenheit Foul-smelling vaginal discharge Difficulty urinating Nausea and vomiting Increased pain at the site of the incision that is unrelieved with pain medicine. Difficulty breathing with or without chest pain New calf pain especially if only on one side Sudden, continuing increased vaginal bleeding with or without clots.   Contacts: For questions or concerns you should contact:   Dr. Katherine Tucker at [phone]   Barney Russomanno, NP at [phone]   After Hours: call [phone] and have the GYN Oncologist paged/contacted (after 5 pm or on the weekends).   Messages sent via mychart are for non-urgent matters and are not responded to after hours so for urgent needs, please call the after hours number. 

## 2022-03-12 NOTE — Op Note (Signed)
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   Anesthesia: GET  Urine Output: 150cc  Operative Findings:  On EUA, 8cm mobile mass within the cul de sac. On intra-abdominal entry, normal upper abdominal survey. Normal omentum, small and large bowel. Surgically absent right adnexa, uterus and cervix. Left ovary replaced by 8-10cm smooth mass; normal fallopian tube. No ascites. 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 ON frozen section, at least borderline tumor of the ovary noted. NO definitive intra-abdominal or pelvic evidence of disease. Adhesions along bilateral deep pelvic sidewalls from prior surgery.  Estimated Blood Loss:  50cc      Total IV Fluids: see I&O flowsheet         Specimens: pelvic washings, left tube and ovary, peritoneal biopsies, omentum         Complications:  None apparent; patient tolerated the procedure well.         Disposition: PACU - hemodynamically stable.  Procedure Details  The patient was seen in the Holding Room. The risks, benefits, complications, treatment options, and expected outcomes were discussed with the patient.  The patient concurred with the proposed plan, giving informed consent.  The site of surgery properly noted/marked. The patient was identified as [name] and the procedure verified as a Robotic-assisted bilateral salpingo-oophorectomy with any other indicated  procedures.   After induction of anesthesia, the patient was draped and prepped in the usual sterile manner. Patient was placed in supine position after anesthesia and draped and prepped in the usual sterile manner as follows: Her arms were tucked to her side with all appropriate precautions.  The shoulders were stabilized with padded shoulder blocks applied to the acromium processes.  The patient was placed in the semi-lithotomy position in [place].  The perineum and vagina were prepped with CholoraPrep. The patient was draped after the CholoraPrep had been allowed to dry for 3 minutes.  A Time Out was held and the above information confirmed.  The urethra was prepped with Betadine. Foley catheter was placed. OG tube placement was confirmed and to suction.   Next, a 10 mm skin incision was made 1 cm below the subcostal margin in the midclavicular line.  The 5 mm Optiview port and scope was used for direct entry.  Opening pressure was under 10 mm CO2.  The abdomen was insufflated and the findings were noted as above.   At this point and all points during the procedure, the patient's intra-abdominal pressure did not exceed 15 mmHg. Next, an 8 mm skin incision was made superior to the umbilicus and a right and left port were placed about 8 cm lateral to the robot port on the right and left side.  A fourth arm was placed on the right. During placement of the lateral port, the trocar came in close proximity to the ascending colon. Although no visible defect noted, decision made to oversew the serosa. The 5 mm assist trocar was exchanged for a 10-12 mm port. All ports were placed under direct visualization.  The patient was placed in steep Trendelenburg.  Bowel was  folded away into the upper abdomen.  The robot was docked in the normal manner.  Two interrupted sutures of 2-0 Vicryl were used to oversew the serosa of the ascending colon.   The left peritoneum were opened parallel to the IP ligament to  open the retroperitoneal spaces bilaterally. The round ligament remnant was incised. The ureter was noted to be on the medial leaf of the broad ligament.  The peritoneum above the ureter was incised and stretched and the infundibulopelvic ligament was skeletonized, cauterized and cut.  The peritoneal attachment of the left adnexa was incised, ultimately freeing the adnexa. This was placed in an Endocatch bag and brought to and through the assist trocar for contained decompression. The cyst was then removed in piecemeal fashion and sent for frozen section.   Once frozen section returned, given no definitive invasion, decision was made to proceed with peritoneal biopsies and omental biopsy. Peritoneal biopsies were performed with monopolar electrocautery. An infracolic omentectomy was then performed using a combination of monopolar electrocautery and bipolar using the vessel sealer. The omentum was placed in an Endocatch bag and removed in piecemeal fashion through the assist trocar.  Irrigation was used and excellent hemostasis was achieved.  At this point in the procedure was completed.  Robotic instruments were removed under direct visulaization.  The robot was undocked. The fascia at the 10-12 mm port was closed with 0 Vicryl using a fascial closure device. The deep subcutaneous tissue was closed with 0 Vicryl on a UR-5 needle.  The subcuticular tissue was closed with 4-0 Vicryl and the skin was closed with 4-0 Monocryl in a subcuticular manner.  Benzoin and steri-strips were applied to the skin at each incision.  Foley catheter was removed.  All sponge, lap and needle counts were correct x  3.   The patient was transferred to the recovery room in stable condition.  Jeral Pinch, MD

## 2022-03-13 ENCOUNTER — Telehealth: Payer: Self-pay | Admitting: *Deleted

## 2022-03-13 ENCOUNTER — Encounter (HOSPITAL_COMMUNITY): Payer: Self-pay | Admitting: Gynecologic Oncology

## 2022-03-13 ENCOUNTER — Encounter: Payer: Self-pay | Admitting: Gynecologic Oncology

## 2022-03-13 NOTE — Telephone Encounter (Signed)
Spoke with [name] this morning. She states she is eating, drinking and urinating well. She has had a small BM and is passing gas. She is taking senokot as prescribed and encouraged her to drink plenty of water. She denies fever or chills. Incisions are dry and intact. She rates her pain 2/10. Her pain is controlled with ibuprofen.     Instructed to call office with any fever, chills, purulent drainage, uncontrolled pain or any other questions or concerns. Patient verbalizes understanding.   Pt aware of post op appointments as well as the office number [phone] and after hours number [phone] to call if she has any questions or concerns

## 2022-03-14 ENCOUNTER — Telehealth: Payer: Self-pay | Admitting: *Deleted

## 2022-03-14 ENCOUNTER — Encounter: Payer: Self-pay | Admitting: Family Medicine

## 2022-03-14 LAB — CYTOLOGY - NON PAP

## 2022-03-14 NOTE — Telephone Encounter (Signed)
Received a call from pathology stating that Dr.Wolfrod would like to speak to Dr.Tucker regarding pt's pathology. Informed them that Dr.Tucker is in clinic right now, we can take a message and she'll give him a call back after clinic. Call back number is [phone]. Pathology verbalized understanding.

## 2022-03-15 ENCOUNTER — Telehealth: Payer: Self-pay | Admitting: Gynecologic Oncology

## 2022-03-15 ENCOUNTER — Other Ambulatory Visit: Payer: Self-pay | Admitting: Gynecologic Oncology

## 2022-03-15 ENCOUNTER — Encounter: Payer: Self-pay | Admitting: Family Medicine

## 2022-03-15 DIAGNOSIS — C189 Malignant neoplasm of colon, unspecified: Secondary | ICD-10-CM

## 2022-03-15 NOTE — Telephone Encounter (Signed)
Called the patient to discuss pathology from surgery.  She notes overall recovery is going well with improvement daily.  I discussed that stains at this time are favoring a GI primary and that this would represent a metastatic lesion from a colorectal cancer.  Patient's last colonoscopy was in 2015.  I recommend that we send an urgent referral to GI to get her in for evaluation.  Patient was amenable to this.  Jeral Pinch MD Gynecologic Oncology

## 2022-03-18 ENCOUNTER — Telehealth: Payer: Self-pay | Admitting: *Deleted

## 2022-03-18 ENCOUNTER — Other Ambulatory Visit: Payer: Self-pay

## 2022-03-18 ENCOUNTER — Telehealth: Payer: Self-pay | Admitting: Family Medicine

## 2022-03-18 ENCOUNTER — Telehealth: Payer: Self-pay

## 2022-03-18 DIAGNOSIS — C801 Malignant (primary) neoplasm, unspecified: Secondary | ICD-10-CM

## 2022-03-18 DIAGNOSIS — C189 Malignant neoplasm of colon, unspecified: Secondary | ICD-10-CM

## 2022-03-18 MED ORDER — CLENPIQ 10-3.5-12 MG-GM -GM/175ML PO SOLN: 1.0000 | Freq: Once | ORAL | 0 refills | Status: AC

## 2022-03-18 NOTE — Telephone Encounter (Signed)
-----   Message from Alexander, DO sent at 03/18/2022  3:06 PM EDT ----- Regarding: RE: Adenocarcinoma Yes, use Adenocarcinoma.   Thank you!  ----- Message ----- From: Howell Pringle, CMA Sent: 03/18/2022   2:41 PM EDT To: Kathyrn Drown, MD; Lavena Bullion, DO Subject: RE: Adenocarcinoma                             Patient is scheduled for 03/28/22 at 3:30 pm. Can I use adenocarcinoma for diagnosis? Anything else I can add? ----- Message ----- From: Lavena Bullion, DO Sent: 03/18/2022   1:56 PM EDT To: Kathyrn Drown, MD; Howell Pringle, CMA Subject: RE: Adenocarcinoma                             Thank you very much for the heads up on this patient. Yes, I'm sure this was a shock to her, and we will certainly work to get everything evaluated quickly for her.   Kristine Chahal, please see message below. She is scheduled to see me in the office on 6/21. Let's get her already scheduled for colonoscopy to be done on 6/22 to expedite her work-up. Can you call her to let her know and review the colonoscopy prep instructions by phone, so that we can hit the ground running when she gets to her initial appointment here.   Again, thank you for letting me know so that we can be sure to work quickly for her.   [place] ----- Message ----- From: Kathyrn Drown, MD Sent: 03/18/2022   1:35 PM EDT To: Lavena Bullion, DO Subject: Adenocarcinoma                                 Hi Dr.Cirigliano  [name] is a patient of our practice.  Recently she saw GYN oncology surgeon Dr. Gaylyn Rong.  She saw her for a adnexa ovarian mass which ended up being adenocarcinoma.  More than likely metastatic from the colon.  Most previous colonoscopy 2015.  Needless to say this is a unpleasant surprise to the patient.  She has an appointment with you in approximately 10 days.  I would assume she will be having a colonoscopy soon thereafter.  I have also initiated referral to hematology oncology with  [facility] long.  I appreciate you seeing her and help working up the origin of this issue.   If there is additional aspects to her care that we need to do please let us know otherwise we will follow along electronically.  Thanks again-Scott Luking MD  [facility]

## 2022-03-18 NOTE — Telephone Encounter (Signed)
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  Currently has an appointment with gastroenterology later in June.  Needs referral to hematology oncology cancer services at [facility].  Please initiate referral for adenocarcinoma.  I did speak with the patient regarding this.  She is willing to go ahead and move forward with all of this.  Plus also she is aware of her gastroenterology appointment.  Gastroenterology will be setting her up for colonoscopy.  Thanks-Dr. Nicki Reaper

## 2022-03-18 NOTE — Telephone Encounter (Signed)
Scheduled colonoscopy with patient on 03/28/22 at 3:30 pm with Dr. Bryan Lemma. Instruction sent through [place] and mailed. Advised patient to pick up clenpiq as soon as possible. Went over instruction with the patient. Patient voiced understanding.

## 2022-03-18 NOTE — Telephone Encounter (Signed)
Referral ordered in EPIC. 

## 2022-03-18 NOTE — Addendum Note (Signed)
Addended by: Howell Pringle on: 03/18/2022 04:14 PM   Modules accepted: Orders

## 2022-03-18 NOTE — Progress Notes (Signed)
clen

## 2022-03-18 NOTE — Telephone Encounter (Signed)
Spoke with the patient and gave the appt with Dr Bryan Lemma with [facility] for 6/21 at 8:20 am

## 2022-03-19 ENCOUNTER — Inpatient Hospital Stay (HOSPITAL_BASED_OUTPATIENT_CLINIC_OR_DEPARTMENT_OTHER): Payer: BC Managed Care – PPO | Admitting: Gynecologic Oncology

## 2022-03-19 ENCOUNTER — Telehealth: Payer: Self-pay | Admitting: Gynecologic Oncology

## 2022-03-19 ENCOUNTER — Encounter: Payer: Self-pay | Admitting: Gynecologic Oncology

## 2022-03-19 DIAGNOSIS — R1900 Intra-abdominal and pelvic swelling, mass and lump, unspecified site: Secondary | ICD-10-CM

## 2022-03-19 DIAGNOSIS — C7960 Secondary malignant neoplasm of unspecified ovary: Secondary | ICD-10-CM

## 2022-03-19 DIAGNOSIS — Z90721 Acquired absence of ovaries, unilateral: Secondary | ICD-10-CM

## 2022-03-19 DIAGNOSIS — Z7189 Other specified counseling: Secondary | ICD-10-CM

## 2022-03-19 DIAGNOSIS — Z9079 Acquired absence of other genital organ(s): Secondary | ICD-10-CM

## 2022-03-19 DIAGNOSIS — C189 Malignant neoplasm of colon, unspecified: Secondary | ICD-10-CM

## 2022-03-19 LAB — SURGICAL PATHOLOGY

## 2022-03-19 NOTE — Progress Notes (Signed)
Gynecologic Oncology Telehealth Consult Note: Gyn-Onc  I connected with [name] on 03/19/22 at  4:20 PM EDT by telephone and verified that I am speaking with the correct person using two identifiers.  I discussed the limitations, risks, security and privacy concerns of performing an evaluation and management service by telemedicine and the availability of in-person appointments. I also discussed with the patient that there may be a patient responsible charge related to this service. The patient expressed understanding and agreed to proceed.  Other persons participating in the visit and their role in the encounter: patient's husband.  Patient's location: home Provider's location: [facility]  Reason for Visit: Follow-up after surgery, treatment discussion  Treatment History: 03/19/22: Robotic-assisted laparoscopic unilateral salpingo-oophorectomy, tumor staging including peritoneal biopsies, infracolic omentectomy, oversew of ascending colon serosa  Interval History: Doing well.  Still has some soreness, improving daily.  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   BMI 35.0-35.9,adult    GERD (gastroesophageal reflux disease)    Normal spontaneous vaginal delivery    2   Pneumonia     Past Surgical History:  Procedure Laterality Date   BREAST BIOPSY Left 07/15/2017   2 masses   BUNIONECTOMY  08/2021   COLONOSCOPY N/A 06/09/2014   Procedure: COLONOSCOPY;  Surgeon: Rogene Houston, MD;  [facility];  Service: Endoscopy;  Laterality: N/A;  830-moved to 945 Ann to notify pt   LAPAROSCOPIC TOTAL HYSTERECTOMY  07/18/2010   RSO   NECK SURGERY  11/2012   c6 c7   ROBOTIC ASSISTED SALPINGO OOPHERECTOMY Left 03/12/2022   Procedure: XI ROBOTIC ASSISTED LEFT SALPINGO OOPHORECTOMY WITH STAGING;  Surgeon: Lafonda Mosses, MD;  Location: [facility];   Service: Gynecology;  Laterality: Left;    Family History  Problem Relation Age of Onset   Heart disease Mother    Prostate cancer Father    Breast cancer Sister 55   Prostate cancer Brother    Colon cancer Neg Hx    Ovarian cancer Neg Hx    Endometrial cancer Neg Hx    Pancreatic cancer Neg Hx     Social History   Socioeconomic History   Marital status: Married    Spouse name: Not on file   Number of children: Not on file   Years of education: Not on file   Highest education level: Not on file  Occupational History   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  Tobacco Use   Smoking status: Never   Smokeless tobacco: Never  Vaping Use   Vaping Use: Never used  Substance and Sexual Activity   Alcohol use: Yes    Comment: 2-3 times/month   Drug use: No   Sexual activity: Yes    Birth control/protection: Surgical  Other Topics Concern   Not on file  Social History Narrative   Not on file   Social Determinants of Health   Financial Resource Strain: Not on file  Food Insecurity: Not on file  Transportation Needs: Not on file  Physical Activity: Not on file  Stress: Not on file  Social Connections: Not on file    Current Medications:  Current Outpatient Medications:    BIOTIN PO, Take 4 tablets by mouth daily., Disp: , Rfl:    Cholecalciferol (VITAMIN D3 PO), Take 1 tablet by mouth daily., Disp: , Rfl:    citalopram (CELEXA) 20 MG tablet,  TAKE ONE (1) TABLET BY MOUTH EVERY DAY, Disp: 90 tablet, Rfl: 1   cyclobenzaprine (FLEXERIL) 5 MG tablet, Take 1 tablet (5 mg total) by mouth 3 (three) times daily as needed for muscle spasms., Disp: 30 tablet, Rfl: 1   ibuprofen (ADVIL) 800 MG tablet, Take 1 tablet (800 mg total) by mouth every 8 (eight) hours as needed for moderate pain. For AFTER surgery only, Disp: 30 tablet, Rfl: 0   loratadine (CLARITIN) 10 MG tablet, Take 10 mg by mouth daily., Disp: , Rfl:    oxyCODONE (OXY IR/ROXICODONE) 5 MG immediate release tablet,  Take 1 tablet (5 mg total) by mouth every 4 (four) hours as needed for severe pain. For AFTER surgery only, do not take and drive, Disp: 10 tablet, Rfl: 0   pantoprazole (PROTONIX) 40 MG tablet, Take 1 tablet (40 mg total) by mouth daily., Disp: 30 tablet, Rfl: 3   rosuvastatin (CRESTOR) 5 MG tablet, TAKE ONE TABLET BY MOUTH ON MONDAYS, WEDNESDAYS, AND FRIDAYS FOR CHOLESTEROL, Disp: 36 tablet, Rfl: 1   senna-docusate (SENOKOT-S) 8.6-50 MG tablet, Take 2 tablets by mouth at bedtime. For AFTER surgery, do not take if having diarrhea, Disp: 30 tablet, Rfl: 0   vitamin C (ASCORBIC ACID) 500 MG tablet, Take 1,000 mg by mouth at bedtime., Disp: , Rfl:   Review of Symptoms: Pertinent positives as per HPI.  Physical Exam: There were no vitals taken for this visit. Deferred given limitations of phone visit.  Laboratory & Radiologic Studies: A.   FALLOPIAN TUBE AND OVARY, LEFT, SALPINGO OOPHORECTOMY:  -    Adenocarcinoma, most consistent with metastatic colorectal  adenocarcinoma, see Comment.  -    Fallopian tube, negative for malignancy/intraepithelial carcinoma  (STIC).   B.   PELVIC SIDEWALL, RIGHT, BIOPSY:  -    Benign, fibroelastic nodule.  -    Negative for malignancy.   C.   CUL DE SAC, POSTERIOR, BIOPSY:  -    Negative for malignancy.   D.   CUL DE SAC, ANTERIOR, BIOPSY:  -    Negative for malignancy.   E.   PELVIC SIDEWALL, LEFT, BIOPSY:  -    Negative for malignancy.   F.   PARACOLIC GUTTER, LEFT, BIOPSY:  -    Negative for malignancy.   G.   PARACOLIC GUTTER, RIGHT, BIOPSY:  -    Negative for malignancy.   H.   OMENTUM:  -    Negative for malignancy.   I.   OVARY REMNANT, LEFT, EXCISION:  -    Adenocarcinoma, most consistent with metastatic colorectal  adenocarcinoma.   COMMENT:   The tumor was interrogated with immunohistochemical (IHC) stains.  The  tumor cells are diffusely and strongly positive for CDX-2, CK20 and mCEA  and the tumor cells are CK7 negative.    This immunoprofile strongly  favors metastatic colorectal adenocarcinoma.   The tumor cells are PAX8  negative to focally equivocal, a finding which is non-contributory.  Primary ovarian mucinous adenocarcinoma can be CK20, CDX-2 and mCEA  positive; although the staining pattern is typically focal and not  diffuse, as in this case.  Also, supportive of a metastasis is CK7  negativity (primary ovarian mucinous carcinoma is typically CK7  positive).   There are rare cells with intracytoplasmic mucin  (mucicarmine stain).   The best IHC stain to differentiate between metastatic colorectal  adenocarcinoma and primary ovarian mucinous adenocarcinoma is SATB2.  This marker has been ordered and will be performed at [facility].  The  result will be reported in an addendum.   Furthermore, the tumor cells are negative for serous markers (p16, p53  (wildtype), WT-1) and negative for endometrioid markers (ER and  vimentin).  The Ki-67 mitotic index is high.  The IHC stains and  mucicarmine stain have satisfactory controls.   ADDENDUM:   The tumor was interrogated with SATB2 immunohistochemical (IHC) stain,  performed at [facility].  The tumor has diffuse strong nuclear  reactivity.  This result, in combination with the prior IHC results, is  essentially diagnostic of a lower GI (colorectal) or appendiceal primary  tumor.  The control is satisfactory.   Assessment & Plan: [name] is a 61 y.o. 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  Patient is overall doing well and meeting postoperative milestones.  Discussed continued expectations.  I have talked with her previously about pathology showing likely GI source.  Confirmatory testing on the tumor resulted today basically diagnostic of a lower GI or appendiceal primary.  The patient is  scheduled to see GI next week for consultation and colonoscopy.  She had many questions about her treatment plan.  She is aware that she will see a medical oncologist afterwards as I do not treat GI cancers.  I discussed the assessment and treatment plan with the patient. The patient was provided with an opportunity to ask questions and all were answered. The patient agreed with the plan and demonstrated an understanding of the instructions.   The patient was advised to call back or see an in-person evaluation if the symptoms worsen or if the condition fails to improve as anticipated.   12 minutes of total time was spent for this patient encounter, including preparation, face-to-face counseling with the patient and coordination of care, and documentation of the encounter.   Jeral Pinch, MD  Division of Gynecologic Oncology  Department of Obstetrics and Gynecology  [facility]

## 2022-03-19 NOTE — Telephone Encounter (Signed)
I spoke with the patient and her husband.  Offered my support again.  Let her know that last stain pathology was waiting on regarding the tumor testing is almost definitive for diagnosing that this was metastatic cancer from a GI source.  The patient understandably has a lot of questions about what treatment may be on the horizon.  We discussed that she will meet with a medical oncologist after the gastroenterologist to discuss this further.  Jeral Pinch MD Gynecologic Oncology

## 2022-03-20 ENCOUNTER — Telehealth: Payer: Self-pay | Admitting: Hematology

## 2022-03-20 NOTE — Telephone Encounter (Signed)
Scheduled appt per 6/12 referral. Pt is aware of appt date and time. Pt is aware to arrive 15 mins prior to appt time and to bring and updated insurance card. Pt is aware of appt location.

## 2022-03-21 ENCOUNTER — Encounter: Payer: Self-pay | Admitting: Gastroenterology

## 2022-03-24 ENCOUNTER — Encounter: Payer: Self-pay | Admitting: Certified Registered Nurse Anesthetist

## 2022-03-27 ENCOUNTER — Ambulatory Visit: Payer: BC Managed Care – PPO | Admitting: Gastroenterology

## 2022-03-27 ENCOUNTER — Encounter: Payer: Self-pay | Admitting: Gastroenterology

## 2022-03-27 VITALS — BP 130/80 | HR 79 | Ht 66.0 in | Wt 207.0 lb

## 2022-03-27 DIAGNOSIS — K769 Liver disease, unspecified: Secondary | ICD-10-CM

## 2022-03-27 DIAGNOSIS — C562 Malignant neoplasm of left ovary: Secondary | ICD-10-CM | POA: Diagnosis not present

## 2022-03-27 DIAGNOSIS — C189 Malignant neoplasm of colon, unspecified: Secondary | ICD-10-CM | POA: Diagnosis not present

## 2022-03-27 NOTE — Progress Notes (Signed)
Chief Complaint: Adenocarcinoma of the ovary   Referring Provider:     Kathyrn Drown, MD, Lafonda Mosses, MD    HPI:     [name] is a 61 y.o. female referred to the Gastroenterology Clinic for evaluation of expedited colonoscopy due to recent diagnosis of adenocarcinoma of the ovary highly suspicious for primary colon origin.  - 12/13/2021: ER evaluation for acute onset LLQ pain.  K3.3 otherwise normal CMP.  Normal CBC, lipase - 12/13/2021: CT abdomen/pelvis: Normal liver parenchyma with 1.5 cm hyperenhancing lesion in the right hepatic lobe, normal pancreas, GI tract.  No lymphadenopathy.  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  Otherwise normal visualized GI tract.  Recommend repeat MRI liver with and without Eovist in 6 months - 03/05/2022: Ca 8.7, otherwise normal CMP.  Normal CBC - 03/12/2022: Robotic-assisted laparoscopic unilateral salpingo-oophorectomy, tumor staging including peritoneal biopsies, infracolic omentectomy, oversew of ascending colon serosa - Pathology from left fallopian tube and ovary with adenocarcinoma, most consistent with metastatic colorectal adenocarcinoma - Remainder of the resected segments were otherwise benign  Has a referral in place to see Dr. Burr Medico in Oncology on 04/01/2022.  She is scheduled for colonoscopy with me tomorrow.  Today, she states no similar GI sxs prior to ER evaluation on 3/9, and pain has not recurred.  Since diagnosis, has had variable bowel habits and decreased appetite which she attributes to anxiety regarding diagnosis.  Otherwise no similar prior GI symptoms.   Hx of GERD, controlled with Protonix 40 mg/day.   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   BMI 35.0-35.9,adult    GERD (gastroesophageal reflux disease)    Normal spontaneous vaginal delivery    2   Pneumonia      Past Surgical History:  Procedure Laterality Date   BREAST BIOPSY Left 07/15/2017   2 masses   BUNIONECTOMY  08/2021   COLONOSCOPY N/A 06/09/2014   Procedure: COLONOSCOPY;  Surgeon: Rogene Houston, MD;  [facility];  Service: Endoscopy;  Laterality: N/A;  830-moved to 945 Ann to notify pt   LAPAROSCOPIC TOTAL HYSTERECTOMY  07/18/2010   RSO   NECK SURGERY  11/2012   c6 c7   ROBOTIC ASSISTED SALPINGO OOPHERECTOMY Left 03/12/2022   Procedure: XI ROBOTIC ASSISTED LEFT SALPINGO OOPHORECTOMY WITH STAGING;  Surgeon: Lafonda Mosses, MD;  Location: [facility];  Service: Gynecology;  Laterality: Left;   Family History  Problem Relation Age of Onset   Heart disease Mother    Prostate cancer Father    Breast cancer Sister 7   Prostate cancer Brother    Colon cancer Neg Hx    Ovarian cancer Neg Hx    Endometrial cancer Neg Hx    Pancreatic cancer Neg Hx    Social History   Tobacco Use   Smoking status: Never   Smokeless tobacco: Never  Vaping Use   Vaping Use: Never used  Substance Use Topics   Alcohol use: Yes    Comment: 2-3 times/month  Drug use: No   Current Outpatient Medications  Medication Sig Dispense Refill   BIOTIN PO Take 4 tablets by mouth daily.     Cholecalciferol (VITAMIN D3 PO) Take 1 tablet by mouth daily.     citalopram (CELEXA) 20 MG tablet TAKE ONE (1) TABLET BY MOUTH EVERY DAY 90 tablet 1   cyclobenzaprine (FLEXERIL) 5 MG tablet Take 1 tablet (5 mg total) by mouth 3 (three) times daily as needed for muscle spasms. 30 tablet 1   ibuprofen (ADVIL) 800 MG tablet Take 1 tablet (800 mg total) by mouth every 8 (eight) hours as needed for moderate pain. For AFTER surgery only  30 tablet 0   loratadine (CLARITIN) 10 MG tablet Take 10 mg by mouth daily.     oxyCODONE (OXY IR/ROXICODONE) 5 MG immediate release tablet Take 1 tablet (5 mg total) by mouth every 4 (four) hours as needed for severe pain. For AFTER surgery only, do not take and drive 10 tablet 0   pantoprazole (PROTONIX) 40 MG tablet Take 1 tablet (40 mg total) by mouth daily. 30 tablet 3   rosuvastatin (CRESTOR) 5 MG tablet TAKE ONE TABLET BY MOUTH ON MONDAYS, WEDNESDAYS, AND FRIDAYS FOR CHOLESTEROL 36 tablet 1   senna-docusate (SENOKOT-S) 8.6-50 MG tablet Take 2 tablets by mouth at bedtime. For AFTER surgery, do not take if having diarrhea 30 tablet 0   vitamin C (ASCORBIC ACID) 500 MG tablet Take 1,000 mg by mouth at bedtime.     No current facility-administered medications for this visit.   Allergies  Allergen Reactions   Wound Dressing Adhesive Rash    Dermabond     Review of Systems: All systems reviewed and negative except where noted in HPI.     Physical Exam:    Wt Readings from Last 3 Encounters:  03/12/22 214 lb 1.1 oz (97.1 kg)  03/11/22 214 lb (97.1 kg)  03/05/22 213 lb (96.6 kg)    There were no vitals taken for this visit. Constitutional:  Pleasant, in no acute distress. Psychiatric: Normal mood and affect. Behavior is normal. Cardiovascular: Normal rate, regular rhythm. No edema Pulmonary/chest: Effort normal and breath sounds normal. No wheezing, rales or rhonchi. Abdominal: Soft, nondistended Neurological: Alert and oriented to person place and time. Skin: Skin is warm and dry. No rashes noted.   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  - Proceed with expedited colonoscopy tomorrow - Has initial appointment with Dr. Burr Medico early  next week - No evidence of lymphadenopathy or hepatic metastasis on recent imaging  2) Hepatic lesion - Plan for repeat MRI with and without Eovist in 3 months  The indications, risks, and benefits of colonoscopy were explained to the patient in detail. Risks include but are not limited to bleeding, perforation, adverse reaction to medications, and cardiopulmonary compromise. Sequelae include but are not limited to the possibility of surgery, hospitalization, and mortality. The patient verbalized understanding and wished to proceed. All questions answered.  Further recommendations pending results of the exam.     Lavena Bullion, DO, FACG  03/27/2022, 8:00 AM   Wolfgang Phoenix, Elayne Snare, MD

## 2022-03-27 NOTE — Patient Instructions (Addendum)
If you are age 59 or younger, your body mass index should be between 19-25. Your Body mass index is 33.41 kg/m. If this is out of the aformentioned range listed, please consider follow up with your Primary Care Provider.   ________________________________________________________  The [facility] providers would like to encourage you to use [facility] to communicate with providers for non-urgent requests or questions.  Due to long hold times on the telephone, sending your provider a message by [facility] may be a faster and more efficient way to get a response.  Please allow 48 business hours for a response.  Please remember that this is for non-urgent requests.  _______________________________________________________  Due to recent changes in healthcare laws, you may see the results of your imaging and laboratory studies on MyChart before your provider has had a chance to review them.  We understand that in some cases there may be results that are confusing or concerning to you. Not all laboratory results come back in the same time frame and the provider may be waiting for multiple results in order to interpret others.  Please give [place] 48 hours in order for your provider to thoroughly review all the results before contacting the office for clarification of your results.   You have been already scheduled for a colonoscopy tomorrow at 3:30 pm.     Thank you for choosing me and [facility].  Vito Cirigliano, D.O.

## 2022-03-28 ENCOUNTER — Encounter: Payer: Self-pay | Admitting: Gastroenterology

## 2022-03-28 ENCOUNTER — Ambulatory Visit (AMBULATORY_SURGERY_CENTER): Payer: BC Managed Care – PPO | Admitting: Gastroenterology

## 2022-03-28 VITALS — BP 128/63 | HR 72 | Temp 97.2°F | Resp 15 | Ht 65.0 in | Wt 204.0 lb

## 2022-03-28 DIAGNOSIS — R933 Abnormal findings on diagnostic imaging of other parts of digestive tract: Secondary | ICD-10-CM | POA: Diagnosis not present

## 2022-03-28 DIAGNOSIS — D125 Benign neoplasm of sigmoid colon: Secondary | ICD-10-CM

## 2022-03-28 DIAGNOSIS — C562 Malignant neoplasm of left ovary: Secondary | ICD-10-CM

## 2022-03-28 DIAGNOSIS — K640 First degree hemorrhoids: Secondary | ICD-10-CM

## 2022-03-28 DIAGNOSIS — C189 Malignant neoplasm of colon, unspecified: Secondary | ICD-10-CM

## 2022-03-28 HISTORY — PX: COLONOSCOPY: SHX174

## 2022-03-28 MED ORDER — SODIUM CHLORIDE 0.9 % IV SOLN: 500.0000 mL | Freq: Once | INTRAVENOUS | Status: DC

## 2022-03-28 NOTE — Progress Notes (Signed)
Vitals-CW  History reviewed. 

## 2022-03-28 NOTE — Progress Notes (Signed)
Called to room to assist during endoscopic procedure.  Patient ID and intended procedure confirmed with present staff. Received instructions for my participation in the procedure from the performing physician.  

## 2022-03-28 NOTE — Op Note (Signed)
[facility] Patient Name: [name] Procedure Date: 03/28/2022 3:33 PM MRN: [id number] Endoscopist: Gerrit Heck , MD Age: 61 Referring MD:  Date of Birth: 12-15-1960 Gender: Female Account #: [id number] Procedure:                Colonoscopy Indications:              Screening for colorectal malignant neoplasm,                            Incidental - Abnormal CT of the GI tract Medicines:                Monitored Anesthesia Care Procedure:                Pre-Anesthesia Assessment:                           - Prior to the procedure, a History and Physical                            was performed, and patient medications and                            allergies were reviewed. The patient's tolerance of                            previous anesthesia was also reviewed. The risks                            and benefits of the procedure and the sedation                            options and risks were discussed with the patient.                            All questions were answered, and informed consent                            was obtained. Prior Anticoagulants: The patient has                            taken no previous anticoagulant or antiplatelet                            agents. ASA Grade Assessment: II - A patient with                            mild systemic disease. After reviewing the risks                            and benefits, the patient was deemed in                            satisfactory condition to undergo the procedure.  After obtaining informed consent, the colonoscope                            was passed under direct vision. Throughout the                            procedure, the patient's blood pressure, pulse, and                            oxygen saturations were monitored continuously. The                            Olympus CF-HQ190L [id number]) Colonoscope was                            introduced through the anus  and advanced to the 10                            cm into the ileum. The colonoscopy was performed                            without difficulty. The patient tolerated the                            procedure well. The quality of the bowel                            preparation was excellent. The terminal ileum,                            ileocecal valve, appendiceal orifice, and rectum                            were photographed. Scope In: 3:40:10 PM Scope Out: 4:01:22 PM Scope Withdrawal Time: 0 hours 19 minutes 11 seconds  Total Procedure Duration: 0 hours 21 minutes 12 seconds  Findings:                 The perianal and digital rectal examinations were                            normal.                           Two sessile polyps were found in the sigmoid colon.                            The polyps were 2 to 3 mm in size. These polyps                            were removed with a cold snare. Resection and                            retrieval were complete. Estimated blood loss was  minimal.                           The exam was otherwise normal throughout the                            remainder of the colon. After withdrawing the                            colonoscope to the rectum and finding no                            significant pathology, the colonoscope was again                            advanced to the cecum and again slowly withdrawn.                           Retroflexion in the right colon was performed.                           The terminal ileum appeared normal.                           Non-bleeding internal hemorrhoids were found during                            retroflexion. The hemorrhoids were small and Grade                            I (internal hemorrhoids that do not prolapse). Complications:            No immediate complications. Estimated Blood Loss:     Estimated blood loss was minimal. Estimated blood                             loss was minimal. Impression:               - Two 2 to 3 mm polyps in the sigmoid colon,                            removed with a cold snare. Resected and retrieved.                           - The remainder of the colon was normal, including                            retoflexed views of the right colon.                           - The examined portion of the ileum was normal.                           - Non-bleeding internal hemorrhoids. Recommendation:           -  Patient has a contact number available for                            emergencies. The signs and symptoms of potential                            delayed complications were discussed with the                            patient. Return to normal activities tomorrow.                            Written discharge instructions were provided to the                            patient.                           - Resume previous diet.                           - Continue present medications.                           - Await pathology results.                           - Repeat colonoscopy for surveillance based on                            pathology results.                           - Follow-up with Dr. Burr Medico in the Oncology Clinic as                            scheduled.                           - Will likely need CT Chest to complete screening,                            pending appointment with Dr. Burr Medico. Gerrit Heck, MD 03/28/2022 4:23:23 PM

## 2022-03-28 NOTE — Progress Notes (Signed)
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  The nature of the procedure, as well as the risks, benefits, and alternatives were carefully and thoroughly reviewed with the patient. Ample time for discussion and questions allowed. The patient understood, was satisfied, and agreed to proceed.     HPI: [name] is a 61 y.o. 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  Patient was most recently seen in the Gastroenterology Clinic on 03/27/2022 by me.  No interval change in medical history since that appointment. Please refer to that note for full details regarding GI history and clinical presentation.   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   BMI 35.0-35.9,adult    Gall stone    GERD (gastroesophageal reflux disease)    Normal spontaneous vaginal delivery    2   Pneumonia     Past Surgical History:  Procedure Laterality Date   BREAST BIOPSY Left 07/15/2017   2 masses   BUNIONECTOMY  08/2021   COLONOSCOPY N/A 06/09/2014   Procedure: COLONOSCOPY;  Surgeon: Rogene Houston, MD;  Location: [facility];  Service: Endoscopy;  Laterality: N/A;  830-moved to 945 Ann to notify pt   HYSTEROTOMY     LAPAROSCOPIC TOTAL HYSTERECTOMY  07/18/2010   RSO   NECK SURGERY  11/2012   c6 c7   ROBOTIC ASSISTED SALPINGO OOPHERECTOMY Left 03/12/2022   Procedure: XI ROBOTIC ASSISTED LEFT SALPINGO OOPHORECTOMY WITH STAGING;  Surgeon: Lafonda Mosses, MD;  Location: [facility];  Service: Gynecology;  Laterality: Left;    Prior to Admission medications    Medication Sig Start Date End Date Taking? Authorizing Provider  BIOTIN PO Take 4 tablets by mouth daily.   Yes [provider]  Cholecalciferol (VITAMIN D3 PO) Take 1 tablet by mouth daily.   Yes [provider]  citalopram (CELEXA) 20 MG tablet TAKE ONE (1) TABLET BY MOUTH EVERY DAY 12/26/21  Yes Luking, Scott A, MD  loratadine (CLARITIN) 10 MG tablet Take 10 mg by mouth daily.   Yes [provider]  pantoprazole (PROTONIX) 40 MG tablet Take 1 tablet (40 mg total) by mouth daily. 12/07/21  Yes Luking, Scott A, MD  rosuvastatin (CRESTOR) 5 MG tablet TAKE ONE TABLET BY MOUTH ON MONDAYS, WEDNESDAYS, AND FRIDAYS FOR CHOLESTEROL 12/26/21  Yes Luking, Scott A, MD  vitamin C (ASCORBIC ACID) 500 MG tablet Take 1,000 mg by mouth at bedtime.   Yes [provider]  ibuprofen (ADVIL) 800 MG tablet Take 1 tablet (800 mg total) by mouth every 8 (eight) hours as needed for moderate pain. For AFTER surgery only 03/11/22   Cross, Lenna Sciara D, NP  senna-docusate (SENOKOT-S) 8.6-50 MG tablet Take 2 tablets by mouth at bedtime. For AFTER surgery, do not take if having diarrhea 03/11/22   Joylene John D, NP    Current Outpatient Medications  Medication Sig Dispense Refill   BIOTIN PO Take 4 tablets by mouth daily.     Cholecalciferol (VITAMIN D3 PO) Take 1 tablet by mouth daily.     citalopram (CELEXA) 20 MG tablet  TAKE ONE (1) TABLET BY MOUTH EVERY DAY 90 tablet 1   loratadine (CLARITIN) 10 MG tablet Take 10 mg by mouth daily.     pantoprazole (PROTONIX) 40 MG tablet Take 1 tablet (40 mg total) by mouth daily. 30 tablet 3   rosuvastatin (CRESTOR) 5 MG tablet TAKE ONE TABLET BY MOUTH ON MONDAYS, WEDNESDAYS, AND FRIDAYS FOR CHOLESTEROL 36 tablet 1   vitamin C (ASCORBIC ACID) 500 MG tablet Take 1,000 mg by mouth at bedtime.     ibuprofen (ADVIL) 800 MG tablet Take 1 tablet (800 mg total) by mouth every 8 (eight) hours as needed for moderate pain. For AFTER surgery only 30 tablet 0    senna-docusate (SENOKOT-S) 8.6-50 MG tablet Take 2 tablets by mouth at bedtime. For AFTER surgery, do not take if having diarrhea 30 tablet 0   Current Facility-Administered Medications  Medication Dose Route Frequency Provider Last Rate Last Admin   0.9 %  sodium chloride infusion  500 mL Intravenous Once Conley Pawling V, DO        Allergies as of 03/28/2022 - Review Complete 03/28/2022  Allergen Reaction Noted   Wound dressing adhesive Rash 03/13/2022    Family History  Problem Relation Age of Onset   Heart disease Mother    Prostate cancer Father    Breast cancer Sister 21   Prostate cancer Brother    Diabetes Brother    Colon cancer Neg Hx    Ovarian cancer Neg Hx    Endometrial cancer Neg Hx    Pancreatic cancer Neg Hx    Stomach cancer Neg Hx    Esophageal cancer Neg Hx     Social History   Socioeconomic History   Marital status: Married    Spouse name: Not on file   Number of children: 2   Years of education: Not on file   Highest education level: Not on file  Occupational History   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   Occupation: retired  Tobacco Use   Smoking status: Never   Smokeless tobacco: Never  Scientific laboratory technician Use: Never used  Substance and Sexual Activity   Alcohol use: Not Currently    Comment: 2-3 times/month   Drug use: No   Sexual activity: Yes    Birth control/protection: Surgical  Other Topics Concern   Not on file  Social History Narrative   Not on file   Social Determinants of Health   Financial Resource Strain: Not on file  Food Insecurity: Not on file  Transportation Needs: Not on file  Physical Activity: Not on file  Stress: Not on file  Social Connections: Not on file  Intimate Partner Violence: Not on file    Physical Exam: Vital signs in last 24 hours: '@BP'$  (!) 144/72 (BP Location: Right Arm, Patient Position: Sitting, Cuff Size: Normal)   Pulse 72   Temp (!) 97.2 F (36.2 C) (Temporal)   Ht '5\' 5"'$   (1.651 m)   Wt 204 lb (92.5 kg)   SpO2 99%   BMI 33.95 kg/m  GEN: NAD EYE: Sclerae anicteric ENT: MMM CV: Non-tachycardic Pulm: CTA b/l GI: Soft, NT/ND NEURO:  Alert & Oriented x [place], DO [facility]   03/28/2022 3:32 PM

## 2022-03-28 NOTE — Patient Instructions (Signed)
Please read handouts provided. Continue present medications. Await pathology results. Follow-up with Dr. Burr Medico.   YOU HAD AN ENDOSCOPIC PROCEDURE TODAY AT [facility]:   Refer to the procedure report that was given to you for any specific questions about what was found during the examination.  If the procedure report does not answer your questions, please call your gastroenterologist to clarify.  If you requested that your care partner not be given the details of your procedure findings, then the procedure report has been included in a sealed envelope for you to review at your convenience later.  YOU SHOULD EXPECT: Some feelings of bloating in the abdomen. Passage of more gas than usual.  Walking can help get rid of the air that was put into your GI tract during the procedure and reduce the bloating. If you had a lower endoscopy (such as a colonoscopy or flexible sigmoidoscopy) you may notice spotting of blood in your stool or on the toilet paper. If you underwent a bowel prep for your procedure, you may not have a normal bowel movement for a few days.  Please Note:  You might notice some irritation and congestion in your nose or some drainage.  This is from the oxygen used during your procedure.  There is no need for concern and it should clear up in a day or so.  SYMPTOMS TO REPORT IMMEDIATELY:  Following lower endoscopy (colonoscopy or flexible sigmoidoscopy):  Excessive amounts of blood in the stool  Significant tenderness or worsening of abdominal pains  Swelling of the abdomen that is new, acute  Fever of 100F or higher   For urgent or emergent issues, a gastroenterologist can be reached at any hour by calling [phone]. Do not use MyChart messaging for urgent concerns.    DIET:  We do recommend a small meal at first, but then you may proceed to your regular diet.  Drink plenty of fluids but you should avoid alcoholic beverages for 24 hours.  ACTIVITY:  You  should plan to take it easy for the rest of today and you should NOT DRIVE or use heavy machinery until tomorrow (because of the sedation medicines used during the test).    FOLLOW UP: Our staff will call the number listed on your records 24-72 hours following your procedure to check on you and address any questions or concerns that you may have regarding the information given to you following your procedure. If we do not reach you, we will leave a message.  We will attempt to reach you two times.  During this call, we will ask if you have developed any symptoms of COVID 19. If you develop any symptoms (ie: fever, flu-like symptoms, shortness of breath, cough etc.) before then, please call [phone].  If you test positive for Covid 19 in the 2 weeks post procedure, please call and report this information to [place].    If any biopsies were taken you will be contacted by phone or by letter within the next 1-3 weeks.  Please call us at [phone] if you have not heard about the biopsies in 3 weeks.    SIGNATURES/CONFIDENTIALITY: You and/or your care partner have signed paperwork which will be entered into your electronic medical record.  These signatures attest to the fact that that the information above on your After Visit Summary has been reviewed and is understood.  Full responsibility of the confidentiality of this discharge information lies with you and/or your care-partner.

## 2022-03-29 ENCOUNTER — Encounter: Payer: Self-pay | Admitting: Gynecologic Oncology

## 2022-03-29 ENCOUNTER — Telehealth: Payer: Self-pay | Admitting: *Deleted

## 2022-04-01 ENCOUNTER — Inpatient Hospital Stay (HOSPITAL_BASED_OUTPATIENT_CLINIC_OR_DEPARTMENT_OTHER): Payer: BC Managed Care – PPO | Admitting: Gynecologic Oncology

## 2022-04-01 ENCOUNTER — Inpatient Hospital Stay (HOSPITAL_BASED_OUTPATIENT_CLINIC_OR_DEPARTMENT_OTHER): Payer: BC Managed Care – PPO | Admitting: Hematology

## 2022-04-01 ENCOUNTER — Encounter: Payer: Self-pay | Admitting: Licensed Clinical Social Worker

## 2022-04-01 ENCOUNTER — Encounter: Payer: Self-pay | Admitting: Hematology

## 2022-04-01 ENCOUNTER — Inpatient Hospital Stay: Payer: BC Managed Care – PPO

## 2022-04-01 ENCOUNTER — Encounter: Payer: Self-pay | Admitting: Gynecologic Oncology

## 2022-04-01 VITALS — BP 132/79 | HR 75 | Temp 97.8°F | Resp 16 | Ht 65.0 in | Wt 202.0 lb

## 2022-04-01 DIAGNOSIS — C562 Malignant neoplasm of left ovary: Secondary | ICD-10-CM

## 2022-04-01 DIAGNOSIS — Z79899 Other long term (current) drug therapy: Secondary | ICD-10-CM | POA: Diagnosis not present

## 2022-04-01 DIAGNOSIS — Z90710 Acquired absence of both cervix and uterus: Secondary | ICD-10-CM

## 2022-04-01 DIAGNOSIS — Z7189 Other specified counseling: Secondary | ICD-10-CM

## 2022-04-01 DIAGNOSIS — C7960 Secondary malignant neoplasm of unspecified ovary: Secondary | ICD-10-CM

## 2022-04-01 DIAGNOSIS — C189 Malignant neoplasm of colon, unspecified: Secondary | ICD-10-CM

## 2022-04-01 DIAGNOSIS — Z90722 Acquired absence of ovaries, bilateral: Secondary | ICD-10-CM

## 2022-04-01 DIAGNOSIS — C19 Malignant neoplasm of rectosigmoid junction: Secondary | ICD-10-CM | POA: Diagnosis present

## 2022-04-01 DIAGNOSIS — C7962 Secondary malignant neoplasm of left ovary: Secondary | ICD-10-CM | POA: Diagnosis present

## 2022-04-01 DIAGNOSIS — Z90721 Acquired absence of ovaries, unilateral: Secondary | ICD-10-CM | POA: Diagnosis not present

## 2022-04-01 DIAGNOSIS — R1900 Intra-abdominal and pelvic swelling, mass and lump, unspecified site: Secondary | ICD-10-CM

## 2022-04-01 NOTE — Progress Notes (Signed)
[facility] Clinical Social Work  Initial Assessment   [name] is a 61 y.o. year old female accompanied by patient and spouse. Clinical Social Work was referred by medical provider for assessment of psychosocial needs.   SDOH (Social Determinants of Health) assessments performed: Yes   SDOH Screenings   Alcohol Screen: Not on file  Depression (PHQ2-9): Low Risk  (01/26/2021)   Depression (PHQ2-9)    PHQ-2 Score: 0  Financial Resource Strain: Not on file  Food Insecurity: Not on file  Housing: Not on file  Physical Activity: Not on file  Social Connections: Not on file  Stress: Not on file  Tobacco Use: Low Risk  (04/01/2022)   Patient History    Smoking Tobacco Use: Never    Smokeless Tobacco Use: Never    Passive Exposure: Not on file  Transportation Needs: Not on file     Distress Screen completed: No     No data to display            Family/Social Information:  Housing Arrangement: patient lives with spouse and 55 y/o daughter.   The couple also has an adult daughter residing locally who got married a few weeks ago. Family members/support persons in your life? 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  Income source: Employment Financial concerns: No Type of concern: None Food access concerns: no Religious or spiritual practice: Software engineer Currently in place:  none  Coping/ Adjustment to diagnosis: Patient understands treatment plan and what happens next? yes Concerns about diagnosis and/or treatment: Overwhelmed by information Patient reported stressors: Adjusting to my illness Hopes and/or priorities: priority is to start treatment w/ the hope of positive results Patient enjoys time with family/ friends Current coping skills/ strengths: Supportive family/friends     SUMMARY: Current SDOH Barriers:  No barriers identified at this time.  Clinical Social Work Clinical Goal(s):  No clinical social  work goals at this time  Interventions: Discussed common feeling and emotions when being diagnosed with cancer, and the importance of support during treatment Informed patient of the support team roles and support services at [facility] Provided CSW contact information and encouraged patient to call with any questions or concerns   Follow Up Plan: Patient will contact CSW with any support or resource needs Patient verbalizes understanding of plan: Yes    Rachel Moulds, LCSW

## 2022-04-02 ENCOUNTER — Other Ambulatory Visit: Payer: Self-pay

## 2022-04-02 DIAGNOSIS — C562 Malignant neoplasm of left ovary: Secondary | ICD-10-CM

## 2022-04-02 LAB — CEA (IN HOUSE-CHCC): CEA (CHCC-In House): <1 ng/mL (ref 0.00–5.00)

## 2022-04-02 LAB — FERRITIN: Ferritin: 99 ng/mL (ref 11–307)

## 2022-04-03 ENCOUNTER — Inpatient Hospital Stay: Payer: BC Managed Care – PPO | Admitting: Hematology

## 2022-04-03 ENCOUNTER — Other Ambulatory Visit: Payer: Self-pay

## 2022-04-03 NOTE — Progress Notes (Signed)
The proposed treatment discussed in conference is for discussion purpose only and is not a binding recommendation.  The patients have not been physically examined, or presented with their treatment options.  Therefore, final treatment plans cannot be decided.  

## 2022-04-03 NOTE — Progress Notes (Signed)
I spoke with Ms Tat and let her know her CEA and ferritin levels were normal.  All questions were answered.  She verbalized understanding.

## 2022-04-05 ENCOUNTER — Other Ambulatory Visit: Payer: Self-pay

## 2022-04-05 ENCOUNTER — Telehealth: Payer: Self-pay

## 2022-04-05 DIAGNOSIS — C189 Malignant neoplasm of colon, unspecified: Secondary | ICD-10-CM

## 2022-04-05 DIAGNOSIS — C562 Malignant neoplasm of left ovary: Secondary | ICD-10-CM

## 2022-04-05 NOTE — Addendum Note (Signed)
Addended by: Berniece Salines A on: 04/05/2022 09:45 AM   Modules accepted: Orders

## 2022-04-05 NOTE — Telephone Encounter (Signed)
-----   Message from [place], DO sent at 04/04/2022  8:35 PM EDT ----- Regarding: RE: [name],  Not 093% certain we can get that done in 1-2 weeks. I'd be concerned that we don't get the images back within that window when done as outpatient. If we can't get it in that timeline, can maybe get a CT Enterography instead. Similar yield in trying to find small bowl cancer.  Mickel Baas, Please see below. Can we get an expedited VCE done and images returned for review that quickly? If not, can you please get this patient set up for ASAP CT Enterography with rads to evaluate for small bowel cancer.  Thank you.   [place] ----- Message ----- From: Truitt Merle, MD Sent: 04/04/2022   4:56 PM EDT To: Lavena Bullion, DO; Royston Bake, RN  [place],  Could you get her in for capsule endoscopy? Just want to make sure we do  not miss small bowel cancer. It will be great if you can do it in next 1-2 weeks   Thanks much   [name]

## 2022-04-05 NOTE — Telephone Encounter (Signed)
Pt scheduled for CT enterography on 7/10 at 5 pm. Pt instructed to arrive at [facility] at 3:30 pm and to have nothing to eat or drink 4 hours prior. Pt verbalized understanding and had no other concerns at end of call.

## 2022-04-07 ENCOUNTER — Encounter: Payer: Self-pay | Admitting: Family Medicine

## 2022-04-08 ENCOUNTER — Encounter: Payer: Self-pay | Admitting: Gynecologic Oncology

## 2022-04-08 ENCOUNTER — Inpatient Hospital Stay: Payer: BC Managed Care – PPO | Attending: Gynecologic Oncology | Admitting: Nutrition

## 2022-04-08 ENCOUNTER — Other Ambulatory Visit: Payer: Self-pay

## 2022-04-08 ENCOUNTER — Encounter: Payer: Self-pay | Admitting: Gastroenterology

## 2022-04-08 ENCOUNTER — Telehealth: Payer: Self-pay | Admitting: Nutrition

## 2022-04-08 DIAGNOSIS — Z90721 Acquired absence of ovaries, unilateral: Secondary | ICD-10-CM | POA: Insufficient documentation

## 2022-04-08 DIAGNOSIS — K219 Gastro-esophageal reflux disease without esophagitis: Secondary | ICD-10-CM | POA: Insufficient documentation

## 2022-04-08 DIAGNOSIS — Z79899 Other long term (current) drug therapy: Secondary | ICD-10-CM | POA: Insufficient documentation

## 2022-04-08 DIAGNOSIS — Z5111 Encounter for antineoplastic chemotherapy: Secondary | ICD-10-CM | POA: Insufficient documentation

## 2022-04-08 DIAGNOSIS — Z90722 Acquired absence of ovaries, bilateral: Secondary | ICD-10-CM | POA: Insufficient documentation

## 2022-04-08 DIAGNOSIS — C562 Malignant neoplasm of left ovary: Secondary | ICD-10-CM | POA: Insufficient documentation

## 2022-04-08 DIAGNOSIS — R109 Unspecified abdominal pain: Secondary | ICD-10-CM | POA: Insufficient documentation

## 2022-04-08 DIAGNOSIS — K635 Polyp of colon: Secondary | ICD-10-CM | POA: Insufficient documentation

## 2022-04-08 DIAGNOSIS — Z90710 Acquired absence of both cervix and uterus: Secondary | ICD-10-CM | POA: Insufficient documentation

## 2022-04-08 NOTE — Telephone Encounter (Signed)
Telephone call completed with patient.  61 year old female diagnosed with cancer of the ovary versus colorectal primary.  Past medical history includes GERD.  Medications include biotin, vitamin D3, Protonix, Crestor, Senokot as, and vitamin C.  Labs reviewed.  Height: 65 inches. Weight: 208 pounds 12.8 ounces June 26. Usual body weight: 190-214 pounds per chart. BMI: 34.75.  Spoke briefly with patient who expresses frustration regarding scheduling pet scan.  She is anxious to finalize treatment plan.  With regard to nutrition, patient reports her appetite has improved and she is eating normally.  She denies nutrition impact symptoms.  Reports she has good support from friends and family.  No nutrition diagnosis at this time.  Patient will likely need additional nutrition information when beginning chemotherapy.  Provided brief nutrition recommendations for adequate protein and minimal weight loss.  Encourage patient to contact RD for questions or concerns regarding nutrition.  **Disclaimer: This note was dictated with voice recognition software. Similar sounding words can inadvertently be transcribed and this note may contain transcription errors which may not have been corrected upon publication of note.**

## 2022-04-08 NOTE — Progress Notes (Signed)
PET scan originally scheduled for 7/7 was rescheduled to 7/13 secondary to insurance prior authorization.  PET scan has been authorized.  I have rescheduled Pet scan to 04/10/2022 at [facility] arrive at 0630 for 0700 scan.  Pt is aware of appt date, time, location, and instructions.  All questions were answered.  She verbalized understanding.

## 2022-04-08 NOTE — Telephone Encounter (Signed)
Nurses Certainly sympathetic with what is going on with [employer] rejecting the PET scan is not unusual. It will take further effort to get this approved.  Approval for a PET scan-getting this approved will come through the efforts of oncology.(PET scans routinely get refused for primary care).  I will send notification to oncology hopefully they can be able to expedite-but in many situations these type of delays can be very frustrating  Please let [name] know that she can also call her insurance company to strongly encourage them to approve the PET scan of and that if they are not doing a reasonable effort to work with oncology that she will file a complaint with the insurance commission regarding this issue against the insurance company for dragging their feet  Thanks-Dr. Nicki Reaper

## 2022-04-10 ENCOUNTER — Ambulatory Visit (HOSPITAL_COMMUNITY)
Admission: RE | Admit: 2022-04-10 | Discharge: 2022-04-10 | Disposition: A | Discharge status: Home / Self Care | Payer: BC Managed Care – PPO | Source: Ambulatory Visit | Attending: Hematology | Admitting: Hematology

## 2022-04-10 DIAGNOSIS — C562 Malignant neoplasm of left ovary: Secondary | ICD-10-CM | POA: Insufficient documentation

## 2022-04-10 LAB — GLUCOSE, CAPILLARY: Glucose-Capillary: 113 mg/dL — ABNORMAL HIGH (ref 70–99)

## 2022-04-10 MED ORDER — FLUDEOXYGLUCOSE F - 18 (FDG) INJECTION
10.2500 | Freq: Once | INTRAVENOUS | Status: AC | PRN
  Administered 2022-04-10: 10.25 via INTRAVENOUS

## 2022-04-11 NOTE — Progress Notes (Signed)
I reviewed PET scan results with [name].  She is unable to come for appt with Dr Burr Medico on 04/16/2022.  Appt made for 04/22/2022 at [facility].  All questions were answered.  She verbalized understanding.

## 2022-04-12 ENCOUNTER — Other Ambulatory Visit (HOSPITAL_COMMUNITY): Payer: BC Managed Care – PPO

## 2022-04-15 ENCOUNTER — Ambulatory Visit (HOSPITAL_COMMUNITY)
Admission: RE | Admit: 2022-04-15 | Discharge: 2022-04-15 | Disposition: A | Discharge status: Home / Self Care | Payer: BC Managed Care – PPO | Source: Ambulatory Visit | Attending: Gastroenterology | Admitting: Gastroenterology

## 2022-04-15 DIAGNOSIS — C189 Malignant neoplasm of colon, unspecified: Secondary | ICD-10-CM | POA: Diagnosis present

## 2022-04-15 DIAGNOSIS — C562 Malignant neoplasm of left ovary: Secondary | ICD-10-CM | POA: Insufficient documentation

## 2022-04-15 MED ORDER — IOHEXOL 300 MG/ML  SOLN
100.0000 mL | Freq: Once | INTRAMUSCULAR | Status: AC | PRN
  Administered 2022-04-15: 100 mL via INTRAVENOUS

## 2022-04-18 ENCOUNTER — Other Ambulatory Visit (HOSPITAL_COMMUNITY): Payer: BC Managed Care – PPO

## 2022-04-22 ENCOUNTER — Encounter: Payer: Self-pay | Admitting: Hematology

## 2022-04-22 ENCOUNTER — Telehealth: Payer: Self-pay | Admitting: Pharmacy Technician

## 2022-04-22 ENCOUNTER — Inpatient Hospital Stay (HOSPITAL_BASED_OUTPATIENT_CLINIC_OR_DEPARTMENT_OTHER): Payer: BC Managed Care – PPO | Admitting: Hematology

## 2022-04-22 ENCOUNTER — Telehealth: Payer: Self-pay | Admitting: Pharmacist

## 2022-04-22 ENCOUNTER — Other Ambulatory Visit (HOSPITAL_COMMUNITY): Payer: Self-pay

## 2022-04-22 ENCOUNTER — Other Ambulatory Visit: Payer: Self-pay

## 2022-04-22 VITALS — BP 131/79 | HR 81 | Temp 98.5°F | Resp 17 | Ht 65.0 in | Wt 212.4 lb

## 2022-04-22 DIAGNOSIS — Z5111 Encounter for antineoplastic chemotherapy: Secondary | ICD-10-CM | POA: Diagnosis not present

## 2022-04-22 DIAGNOSIS — C562 Malignant neoplasm of left ovary: Secondary | ICD-10-CM

## 2022-04-22 DIAGNOSIS — Z90722 Acquired absence of ovaries, bilateral: Secondary | ICD-10-CM | POA: Diagnosis not present

## 2022-04-22 DIAGNOSIS — Z90721 Acquired absence of ovaries, unilateral: Secondary | ICD-10-CM | POA: Diagnosis not present

## 2022-04-22 DIAGNOSIS — R109 Unspecified abdominal pain: Secondary | ICD-10-CM | POA: Diagnosis not present

## 2022-04-22 DIAGNOSIS — K219 Gastro-esophageal reflux disease without esophagitis: Secondary | ICD-10-CM | POA: Diagnosis not present

## 2022-04-22 DIAGNOSIS — Z79899 Other long term (current) drug therapy: Secondary | ICD-10-CM | POA: Diagnosis not present

## 2022-04-22 DIAGNOSIS — Z90710 Acquired absence of both cervix and uterus: Secondary | ICD-10-CM | POA: Diagnosis not present

## 2022-04-22 DIAGNOSIS — K635 Polyp of colon: Secondary | ICD-10-CM | POA: Diagnosis not present

## 2022-04-22 MED ORDER — CAPECITABINE 500 MG PO TABS
ORAL_TABLET | ORAL | 1 refills | Status: DC
  Filled 2022-04-22: qty 98, fill #0
  Filled 2022-04-23: qty 98, 21d supply, fill #0
  Filled 2022-05-10: qty 98, 21d supply, fill #1

## 2022-04-22 MED ORDER — PROCHLORPERAZINE MALEATE 10 MG PO TABS: 10.0000 mg | ORAL_TABLET | Freq: Four times a day (QID) | ORAL | 1 refills | Status: DC | PRN

## 2022-04-22 MED ORDER — CAPECITABINE 500 MG PO TABS
ORAL_TABLET | ORAL | 1 refills | Status: DC
  Filled 2022-04-22: qty 98, fill #0

## 2022-04-22 MED ORDER — ONDANSETRON HCL 8 MG PO TABS: 8.0000 mg | ORAL_TABLET | Freq: Two times a day (BID) | ORAL | 1 refills | Status: DC | PRN

## 2022-04-22 NOTE — Progress Notes (Signed)
[place]   [phone] Fax:([phone]) [phone]   Clinic Follow up Note   Patient Care Team: Kathyrn Drown, MD as PCP - General (Family Medicine) Terrance Mass, MD (Inactive) as Consulting Physician (Obstetrics and Gynecology) Jovita Gamma, MD as Consulting Physician (Neurosurgery) Rogene Houston, MD as Consulting Physician (Gastroenterology) Truitt Merle, MD as Consulting Physician (Oncology) Royston Bake, RN as Oncology Nurse Navigator (Oncology)  Date of Service:  04/22/2022  CHIEF COMPLAINT: f/u of ovarian adenocarcinoma  CURRENT THERAPY:  PENDING Adjuvant CAPEOX  ASSESSMENT & PLAN:  [name] is a 61 y.o. female with   1. mucinous adenocarcinoma of left ovary, Stage IIB -presented with acute left abdominal pain. CT [facility] on 12/13/21 in ED showed a 6.6 cm hemorrhagic mass. Baseline CA 125 from 01/03/22 was WNL at 9.7. her pain resolved and no recurrent or other symptoms -S/p left salpingo-oophorectomy on 03/12/22 with Dr. Berline Lopes. 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 I spoke with pathologist Dr. Vic Ripper, he agrees with the diagnosis but pointed out that 2% ovarian cancer is intestinal type and IHC will be similar to low GI primary.  -colonoscopy on 03/28/22 with Dr. Bryan Lemma showed no suspicious mass, only polyps. Path from polyp biopsy showed tubular adenoma. -baseline CEA on 04/01/22 was WNL.  -PET scan on 04/10/22 showed: no convincing evidence of hypermetabolic disease; postsurgical hypermetabolism in left adnexa and abdominal wall; nonspecific asymmetric right-sided hypermetabolic tonsillar hyperplasia. -enterography on 04/15/22 showed: no small bowel masses or nodularity or tumor along omentum or mesentery; no definite appendiceal tumor. -I reviewed the recent scans with the patient and her husband today. I discussed her case with pathology and Dr. Berline Lopes; given that we  do not see a GI primary, we feel this is likely mucinous (GI type) ovarian cancer. Given her moderate risk of recurrence, I recommend adjuvant treatment with colorectal regimen FOLFOX or CAPEOX due to the histology of the cancer. I recommend 3 month chemo. I reviewed the indications and side effects of both. -after discussion, she is most interested in CAPEOX. Our oral pharmacist Wells Guiles will reach out to them with information on Xeloda. We will also schedule a chemo education class. --Chemotherapy consent: Side effects including but does not not limited to, fatigue, nausea, vomiting, diarrhea, hair loss, neuropathy, fluid retention, renal and kidney dysfunction, neutropenic fever, needed for blood transfusion, bleeding, were discussed with patient in great detail. She agrees to proceed. -The goal of chemotherapy is curative. -Plan to start first cycle next week.  She decided not to have a port    2. Genetics  -will refer her to genetics    PLAN:  -chemo class this week -lab and start CAPEOX on 7/25, I called in capecitabine 1500 mg a.m., 2000 mg p.m. every 12 hours, for day 1-14 every 21 days -lab and f/u in 2 weeks for toxicity checkup after first cycle.   No problem-specific Assessment & Plan notes found for this encounter.   SUMMARY OF ONCOLOGIC HISTORY: Oncology History  Adenocarcinoma (epithelial) of ovary, left ([place])  12/13/2021 Imaging   EXAM: CT ABDOMEN AND PELVIS WITH CONTRAST  IMPRESSION: 1. 6.6 cm hyperdense left adnexal/ovarian mass, likely a hemorrhagic cyst/mass. Recommend follow-up pelvic ultrasound. 2. 1.5 cm hyperenhancing lesion in the right hepatic lobe. Most likely differentials include hemangioma, FNH, adenoma. Consider nonemergent follow-up MRI abdomen with contrast.   01/03/2022 Tumor Marker   CA 125: 9.7 (WNL)   01/14/2022 Imaging  EXAM: MRI ABDOMEN WITHOUT AND WITH CONTRAST  IMPRESSION: Enhancing 13 mm segment VI hepatic lesion measuring 13  mm demonstrates nonspecific imaging characteristics but which is favored to reflect a benign etiology such as focal nodular hyperplasia or a hepatic adenoma. Recommend follow-up MRI in 6 months with and without EOVIST contrast for more definitive characterization and to assess stability.   03/12/2022 Pathology Results   FINAL MICROSCOPIC DIAGNOSIS:   A.   FALLOPIAN TUBE AND OVARY, LEFT, SALPINGO OOPHORECTOMY:  -    Adenocarcinoma, most consistent with metastatic colorectal  adenocarcinoma, see Comment.  -    Fallopian tube, negative for malignancy/intraepithelial carcinoma (STIC).   B.   PELVIC SIDEWALL, RIGHT, BIOPSY:  -    Benign, fibroelastic nodule.  -    Negative for malignancy.   C.   CUL DE SAC, POSTERIOR, BIOPSY:  -    Negative for malignancy.   D.   CUL DE SAC, ANTERIOR, BIOPSY:  -    Negative for malignancy.   E.   PELVIC SIDEWALL, LEFT, BIOPSY:  -    Negative for malignancy.   F.   PARACOLIC GUTTER, LEFT, BIOPSY:  -    Negative for malignancy.   G.   PARACOLIC GUTTER, RIGHT, BIOPSY:  -    Negative for malignancy.   H.   OMENTUM:  -    Negative for malignancy.   I.   OVARY REMNANT, LEFT, EXCISION:  -    Adenocarcinoma, most consistent with metastatic colorectal  adenocarcinoma.   COMMENT:  The tumor was interrogated with immunohistochemical (IHC) stains.  The tumor cells are diffusely and strongly positive for CDX-2, CK20 and mCEA and the tumor cells are CK7 negative.   This immunoprofile strongly favors metastatic colorectal adenocarcinoma.   The tumor cells are PAX8 negative to focally equivocal, a finding which is non-contributory. Primary ovarian mucinous adenocarcinoma can be CK20, CDX-2 and mCEA positive; although the staining pattern is typically focal and not diffuse, as in this case.  Also, supportive of a metastasis is CK7 negativity (primary ovarian mucinous carcinoma is typically CK7 positive).   There are rare cells with intracytoplasmic mucin   (mucicarmine stain).   The best IHC stain to differentiate between metastatic colorectal  adenocarcinoma and primary ovarian mucinous adenocarcinoma is SATB2. This marker has been ordered and will be performed at [facility].  The result will be reported in an addendum.   Furthermore, the tumor cells are negative for serous markers (p16, p53 (wildtype), WT-1) and negative for endometrioid markers (ER and vimentin).  The Ki-67 mitotic index is high.  The IHC stains and mucicarmine stain have satisfactory controls.    ADDENDUM:  The tumor was interrogated with SATB2 immunohistochemical (IHC) stain, performed at [facility].  The tumor has diffuse strong nuclear reactivity.  This result, in combination with the prior IHC results, is essentially diagnostic of a lower GI (colorectal) or appendiceal primary tumor.  The control is satisfactory.    03/28/2022 Procedure   Colonoscopy, Dr. Bryan Lemma  Impression: - Two 2 to 3 mm polyps in the sigmoid colon, removed with a cold snare. Resected and retrieved. - The remainder of the colon was normal, including retoflexed views of the right colon. - The examined portion of the ileum was normal. - Non-bleeding internal hemorrhoids.   04/01/2022 Initial Diagnosis   Adenocarcinoma (epithelial) of ovary, left ([place])   04/17/2022 Cancer Staging   Staging form: Ovary, Fallopian Tube, and Primary Peritoneal Carcinoma, AJCC 8th Edition - Clinical stage from 04/17/2022: FIGO  Stage IIB (cT2b, cN0, cM0) - Signed by Truitt Merle, MD on 04/22/2022 Stage prefix: Initial diagnosis Histologic grade (G): GX Histologic grading system: 4 grade system   04/29/2022 -  Chemotherapy   Patient is on Treatment Plan : COLORECTAL Xelox (Capeox) q21d        INTERVAL HISTORY:  [name] is here for a follow up of ovarian adenocarcinoma. She was last seen by me on 04/01/22. She presents to the clinic accompanied by her husband. She reports she did well with her recent scans,  except for some issues from the contrast for the enterography.    All other systems were reviewed with the patient and are negative.  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   BMI 35.0-35.9,adult    Cancer ([place]) 03/12/2022   colon adenoca on ovary   Gall stone    GERD (gastroesophageal reflux disease)    Normal spontaneous vaginal delivery    2   Pneumonia     SURGICAL HISTORY: Past Surgical History:  Procedure Laterality Date   BREAST BIOPSY Left 07/15/2017   2 masses   BUNIONECTOMY  08/2021   COLONOSCOPY N/A 06/09/2014   Procedure: COLONOSCOPY;  Surgeon: Rogene Houston, MD;  Location[facility];  Service: Endoscopy;  Laterality: N/A;  830-moved to 945 Ann to notify pt   COLONOSCOPY  03/28/2022   HYSTEROTOMY     LAPAROSCOPIC TOTAL HYSTERECTOMY  07/18/2010   RSO   NECK SURGERY  11/2012   c6 c7   ROBOTIC ASSISTED SALPINGO OOPHERECTOMY Left 03/12/2022   Procedure: XI ROBOTIC ASSISTED LEFT SALPINGO OOPHORECTOMY WITH STAGING;  Surgeon: Lafonda Mosses, MD;  Location: [facility] ORS;  Service: Gynecology;  Laterality: Left;    I have reviewed the social history and family history with the patient and they are unchanged from previous note.  ALLERGIES:  is allergic to wound dressing adhesive.  MEDICATIONS:  Current Outpatient Medications  Medication Sig Dispense Refill   BIOTIN PO Take 4 tablets by mouth daily.     capecitabine (XELODA) 500 MG tablet Take 3 tablets (1500 mg total) by mouth in morning and 4 tablets (2000 mg total) by mouth in evening every 12 hours. Take for 14 days then off for 7 days. Take within 30 minutes after meals. 98 tablet 1   Cholecalciferol (VITAMIN D3 PO) Take 1 tablet by mouth daily.     citalopram (CELEXA) 20 MG tablet TAKE ONE (1) TABLET BY MOUTH EVERY DAY 90 tablet 1   ibuprofen (ADVIL) 800 MG tablet Take 1 tablet (800 mg  total) by mouth every 8 (eight) hours as needed for moderate pain. For AFTER surgery only 30 tablet 0   loratadine (CLARITIN) 10 MG tablet Take 10 mg by mouth daily.     ondansetron (ZOFRAN) 8 MG tablet Take 1 tablet (8 mg total) by mouth 2 (two) times daily as needed for refractory nausea / vomiting. Start on day 3 after chemotherapy. 30 tablet 1   pantoprazole (PROTONIX) 40 MG tablet Take 1 tablet (40 mg total) by mouth daily. 30 tablet 3   prochlorperazine (COMPAZINE) 10 MG tablet Take 1 tablet (10 mg total) by mouth every 6 (six) hours as needed (Nausea or vomiting). 30 tablet 1   rosuvastatin (CRESTOR) 5 MG tablet TAKE ONE TABLET BY MOUTH ON MONDAYS, WEDNESDAYS, AND FRIDAYS FOR CHOLESTEROL 36 tablet 1  senna-docusate (SENOKOT-S) 8.6-50 MG tablet Take 2 tablets by mouth at bedtime. For AFTER surgery, do not take if having diarrhea 30 tablet 0   vitamin C (ASCORBIC ACID) 500 MG tablet Take 1,000 mg by mouth at bedtime.     No current facility-administered medications for this visit.    PHYSICAL EXAMINATION: ECOG PERFORMANCE STATUS: 0 - Asymptomatic  Vitals:   04/22/22 0824  BP: 131/79  Pulse: 81  Resp: 17  Temp: 98.5 F (36.9 C)  SpO2: 97%   Wt Readings from Last 3 Encounters:  04/22/22 212 lb 6.4 oz (96.3 kg)  04/01/22 208 lb 12.8 oz (94.7 kg)  04/01/22 202 lb (91.6 kg)     GENERAL:alert, no distress and comfortable SKIN: skin color normal, no rashes or significant lesions EYES: normal, Conjunctiva are pink and non-injected, sclera clear  NEURO: alert & oriented x 3 with fluent speech  LABORATORY DATA:  I have reviewed the data as listed    Latest Ref Rng & Units 03/05/2022    9:22 AM 12/13/2021    4:08 PM 06/16/2020    9:57 AM  CBC  WBC 4.0 - 10.5 K/uL 7.4  6.4  5.9   Hemoglobin 12.0 - 15.0 g/dL 13.6  12.5  14.2   Hematocrit 36.0 - 46.0 % 40.9  38.3  43.5   Platelets 150 - 400 K/uL 336  299          Latest Ref Rng & Units 03/05/2022    9:22 AM 12/13/2021    4:08 PM  10/27/2020   11:06 AM  CMP  Glucose 70 - 99 mg/dL 90  162    BUN 6 - 20 mg/dL 20  12    Creatinine 0.44 - 1.00 mg/dL 0.80  0.60    Sodium 135 - 145 mmol/L 141  140    Potassium 3.5 - 5.1 mmol/L 4.0  3.3    Chloride 98 - 111 mmol/L 107  109    CO2 22 - 32 mmol/L 27  24    Calcium 8.9 - 10.3 mg/dL 8.7  8.8    Total Protein 6.5 - 8.1 g/dL 6.9  6.7  7.2   Total Bilirubin 0.3 - 1.2 mg/dL 0.7  0.3  0.6   Alkaline Phos 38 - 126 U/L 51  55  73   AST 15 - 41 U/L _0 ALT 0 - 44 U/L 19  34  22       RADIOGRAPHIC STUDIES: I have personally reviewed the radiological images as listed and agreed with the findings in the report. No results found.    No orders of the defined types were placed in this encounter.  All questions were answered. The patient knows to call the clinic with any problems, questions or concerns. No barriers to learning was detected. The total time spent in the appointment was 40 minutes.     Truitt Merle, MD 04/22/2022   I, Wilburn Mylar, am acting as scribe for Truitt Merle, MD.   I have reviewed the above documentation for accuracy and completeness, and I agree with the above.

## 2022-04-22 NOTE — Progress Notes (Signed)
DISCONTINUE OFF PATHWAY REGIMEN - Ovarian   OFF01020:mFOLFOX6 (Leucovorin IV D1 + Fluorouracil IV D1/CIV D1,2 + Oxaliplatin IV D1) q14 Days:   A cycle is every 14 days:     Oxaliplatin      Leucovorin      Fluorouracil      Fluorouracil   **Always confirm dose/schedule in your pharmacy ordering system**  REASON: Other Reason PRIOR TREATMENT: Off Pathway: [id number] (Leucovorin IV D1 + Fluorouracil IV D1/CIV D1,2 + Oxaliplatin IV D1) q14 Days TREATMENT RESPONSE: Unable to Evaluate  START OFF PATHWAY REGIMEN - Ovarian   [id number]:Capecitabine + Oxaliplatin (850/130):   A cycle is every 21 days:     Capecitabine      Oxaliplatin   **Always confirm dose/schedule in your pharmacy ordering system**  Patient Characteristics: Postoperative without Neoadjuvant Therapy (Pathologic Staging), Newly Diagnosed, Adjuvant Therapy, Stage IIB and Stage IIIA/B/C Optimal Cytoreduction BRCA Mutation Status: Awaiting Test Results Therapeutic Status: Postoperative without Neoadjuvant Therapy (Pathologic Staging) AJCC 8 Stage Grouping: IIB AJCC M Category: cM0 AJCC T Category: pT2b AJCC N Category: pN0 Intent of Therapy: Curative Intent, Discussed with Patient

## 2022-04-22 NOTE — Progress Notes (Signed)
START OFF PATHWAY REGIMEN - Ovarian   [id number]:[id number] (Leucovorin IV D1 + Fluorouracil IV D1/CIV D1,2 + Oxaliplatin IV D1) q14 Days:   A cycle is every 14 days:     Oxaliplatin      Leucovorin      Fluorouracil      Fluorouracil   **Always confirm dose/schedule in your pharmacy ordering system**  Patient Characteristics: Postoperative without Neoadjuvant Therapy (Pathologic Staging), Newly Diagnosed, Adjuvant Therapy, Stage IIB and Stage IIIA/B/C Optimal Cytoreduction BRCA Mutation Status: Awaiting Test Results Therapeutic Status: Postoperative without Neoadjuvant Therapy (Pathologic Staging) AJCC 8 Stage Grouping: IIB AJCC M Category: cM0 AJCC T Category: pT2b AJCC N Category: pN0 Intent of Therapy: Curative Intent, Discussed with Patient

## 2022-04-22 NOTE — Telephone Encounter (Signed)
Oral Oncology Patient Advocate Encounter  Prior Authorization for Capecitabine (Xeloda) has been approved.    PA# [id number] Effective dates: 04/22/2022 through 04/23/2023  Patients co-pay is $0.    Lady Deutscher, CPhT-Adv Pharmacy Patient [place] Patient Advocate Team Direct Number: [phone]  Fax: [phone]

## 2022-04-22 NOTE — Telephone Encounter (Signed)
Oral Oncology Pharmacist Encounter  Received new prescription for Xeloda (capecitabine) for the treatment of stage IIB ovarian cancer, in conjunction with oxaliplatin, planned duration minimum 4 cycles.  CMP and CBC from 03/05/22 assessed, no relevant lab abnormalities noted. Prescription dose and frequency assessed for appropriateness.   Current medication list in Epic reviewed, DDIs with Xeloda identified: Category C DDI between Xeloda and Pantoprazole - proton-pump inhibitors can decrease efficacy of Xeloda - will discuss with patient alternatives to pantoprazole, such as H2RA's like famotidine while on Xeloda. Category C DDI between Xeloda and Citalopram due to risk of Qtc prolongation - will discuss if MD would like baseline EKG prior to therapy initiation.   Evaluated chart and no patient barriers to medication adherence noted.   Patient agreement for treatment documented in MD note on 04/22/22.  Prescription has been e-scribed to the [facility] for benefits analysis and approval.  Oral Oncology Clinic will continue to follow for insurance authorization, copayment issues, initial counseling and start date.  Leron Croak, PharmD, BCPS, BCOP Hematology/Oncology Clinical Pharmacist [facility] and [place] [phone] 04/22/2022 11:18 AM

## 2022-04-22 NOTE — Telephone Encounter (Signed)
Oral Oncology Patient Advocate Encounter   Received notification that prior authorization for Capecitabine (Xeloda) is required.   PA submitted on 04/22/2022 Key [id number] Status is pending     Lady Deutscher, CPhT-Adv Pharmacy Patient Advocate Specialist [place] Patient Advocate Team Direct Number: [phone]  Fax: [phone]

## 2022-04-23 ENCOUNTER — Other Ambulatory Visit (HOSPITAL_COMMUNITY): Payer: Self-pay

## 2022-04-23 ENCOUNTER — Other Ambulatory Visit: Payer: Self-pay | Admitting: Hematology

## 2022-04-23 ENCOUNTER — Encounter: Payer: Self-pay | Admitting: Family Medicine

## 2022-04-23 ENCOUNTER — Other Ambulatory Visit: Payer: Self-pay | Admitting: Family Medicine

## 2022-04-23 MED ORDER — VENLAFAXINE HCL ER 75 MG PO CP24: 75.0000 mg | ORAL_CAPSULE | Freq: Every day | ORAL | 1 refills | Status: DC

## 2022-04-23 MED ORDER — VENLAFAXINE HCL ER 75 MG PO CP24
75.0000 mg | ORAL_CAPSULE | Freq: Every day | ORAL | 1 refills | Status: DC
Start: 2022-04-23 — End: 2022-12-05

## 2022-04-23 MED ORDER — FAMOTIDINE 20 MG PO TABS: 20.0000 mg | ORAL_TABLET | Freq: Two times a day (BID) | ORAL | 5 refills | Status: DC

## 2022-04-23 NOTE — Telephone Encounter (Signed)
Oral Chemotherapy Pharmacist Encounter   Called patient to discuss medication changes with pantoprazole and citalopram due to drug-drug interactions with Xeloda.  Patient is willing to discontinue her pantoprazole while on Xeloda and use famotidine for GERD.  Dr. Burr Medico planning on switching patient from citalopram to venlafaxine XR while patient is on Xeloda due to concerns for Qtc prolongation. Patient OK with this change and would like prescription sent to [facility] Dr. Burr Medico made aware.  Will call patient on 04/24/22 at 9AM for more detailed education as patient was out at the time of phone call.   Leron Croak, PharmD, BCPS, BCOP Hematology/Oncology Clinical Pharmacist [facility] and [place] [phone] 04/23/2022 2:12 PM

## 2022-04-24 ENCOUNTER — Encounter: Payer: Self-pay | Admitting: Family Medicine

## 2022-04-24 ENCOUNTER — Telehealth: Payer: Self-pay | Admitting: Hematology

## 2022-04-24 NOTE — Telephone Encounter (Signed)
Scheduled follow-up appointments per 7/17 los. Patient is aware. 

## 2022-04-24 NOTE — Telephone Encounter (Signed)
Oral Chemotherapy Pharmacist Encounter  I spoke with patient for overview of: Xeloda (capecitabine) for the treatment of stage IIB ovarian cancer, in conjunction with oxaliplatin, planned duration minimum 4 cycles.  Counseled patient on administration, dosing, side effects, monitoring, drug-food interactions, safe handling, storage, and disposal.  Patient will take Xeloda '500mg'$  tablets, 3 tablets ('1500mg'$ ) by mouth in AM and 4 tabs ('2000mg'$ ) by mouth in PM, within 30 minutes of finishing meals, on days 1-14 of each 21 day cycle.   Oxaliplatin will be infused on day 1 of each 21 day cycle.  Xeloda and oxaliplatin start date: 04/30/22  Adverse effects include but are not limited to: fatigue, decreased blood counts, GI upset, diarrhea, mouth sores, and hand-foot syndrome. Patient has anti-emetic on hand and knows to take it if nausea develops.   Patient will obtain anti diarrheal and alert the office of 4 or more loose stools above baseline.  Reviewed with patient importance of keeping a medication schedule and plan for any missed doses. No barriers to medication adherence identified.  Medication reconciliation performed and medication/allergy list updated. Patient starting famotidine (discontinuing pantoprazole). And will switch from citalopram to venlafaxine XR to decrease risk of Qtc prolongation.  Insurance authorization for Xeloda has been obtained. Test claim at the pharmacy revealed copayment $0 for 1st fill of Xeloda. This was shipped from the [facility] on 04/23/22 to deliver to patient's home on 05/04/22.  Patient informed the pharmacy will reach out 5-7 days prior to needing next fill of Xeloda to coordinate continued medication acquisition to prevent break in therapy.  All questions answered.  Ms. Ammirati voiced understanding and appreciation.   Medication education handout e-mailed to patient. Patient knows to call the office with questions or concerns. Oral  Chemotherapy Clinic phone number provided to patient.   Leron Croak, PharmD, BCPS, [facility] Hematology/Oncology Clinical Pharmacist [facility] and [place] [phone] 04/24/2022 10:21 AM

## 2022-04-26 MED FILL — Dexamethasone Sodium Phosphate Inj 100 MG/10ML: INTRAMUSCULAR | Qty: 1 | Status: AC

## 2022-04-29 ENCOUNTER — Inpatient Hospital Stay: Payer: BC Managed Care – PPO

## 2022-04-29 ENCOUNTER — Other Ambulatory Visit: Payer: Self-pay

## 2022-04-29 DIAGNOSIS — C562 Malignant neoplasm of left ovary: Secondary | ICD-10-CM

## 2022-04-30 ENCOUNTER — Inpatient Hospital Stay: Payer: BC Managed Care – PPO

## 2022-04-30 VITALS — BP 146/82 | HR 97 | Temp 98.1°F | Resp 18 | Wt 211.8 lb

## 2022-04-30 DIAGNOSIS — C562 Malignant neoplasm of left ovary: Secondary | ICD-10-CM

## 2022-04-30 LAB — CBC WITH DIFFERENTIAL (CANCER CENTER ONLY)
Abs Immature Granulocytes: 0.01 10*3/uL (ref 0.00–0.07)
Basophils Absolute: 0 10*3/uL (ref 0.0–0.1)
Basophils Relative: 0 %
Eosinophils Absolute: 0.3 10*3/uL (ref 0.0–0.5)
Eosinophils Relative: 7 %
HCT: 42 % (ref 36.0–46.0)
Hemoglobin: 14.3 g/dL (ref 12.0–15.0)
Immature Granulocytes: 0 %
Lymphocytes Relative: 42 %
Lymphs Abs: 1.9 10*3/uL (ref 0.7–4.0)
MCH: 29 pg (ref 26.0–34.0)
MCHC: 34 g/dL (ref 30.0–36.0)
MCV: 85.2 fL (ref 80.0–100.0)
Monocytes Absolute: 0.4 10*3/uL (ref 0.1–1.0)
Monocytes Relative: 8 %
Neutro Abs: 2 10*3/uL (ref 1.7–7.7)
Neutrophils Relative %: 43 %
Platelet Count: 312 10*3/uL (ref 150–400)
RBC: 4.93 MIL/uL (ref 3.87–5.11)
RDW: 13.2 % (ref 11.5–15.5)
WBC Count: 4.6 10*3/uL (ref 4.0–10.5)
nRBC: 0 % (ref 0.0–0.2)

## 2022-04-30 LAB — CMP (CANCER CENTER ONLY)
ALT: 46 U/L — ABNORMAL HIGH (ref 0–44)
AST: 26 U/L (ref 15–41)
Albumin: 4.3 g/dL (ref 3.5–5.0)
Alkaline Phosphatase: 58 U/L (ref 38–126)
Anion gap: 5 (ref 5–15)
BUN: 13 mg/dL (ref 6–20)
CO2: 28 mmol/L (ref 22–32)
Calcium: 9.7 mg/dL (ref 8.9–10.3)
Chloride: 108 mmol/L (ref 98–111)
Creatinine: 0.7 mg/dL (ref 0.44–1.00)
GFR, Estimated: >60 mL/min (ref >60)
Glucose, Bld: 100 mg/dL — ABNORMAL HIGH (ref 70–99)
Potassium: 4.3 mmol/L (ref 3.5–5.1)
Sodium: 141 mmol/L (ref 135–145)
Total Bilirubin: 0.5 mg/dL (ref 0.3–1.2)
Total Protein: 6.9 g/dL (ref 6.5–8.1)

## 2022-04-30 MED ORDER — SODIUM CHLORIDE 0.9 % IV SOLN
10.0000 mg | Freq: Once | INTRAVENOUS | Status: DC
  Filled 2022-04-30: qty 1

## 2022-04-30 MED ORDER — PALONOSETRON HCL INJECTION 0.25 MG/5ML
0.2500 mg | Freq: Once | INTRAVENOUS | Status: AC
  Administered 2022-04-30: 0.25 mg via INTRAVENOUS
  Filled 2022-04-30: qty 5

## 2022-04-30 MED ORDER — SODIUM CHLORIDE 0.9 % IV SOLN
10.0000 mg | Freq: Once | INTRAVENOUS | Status: AC
  Administered 2022-04-30: 10 mg via INTRAVENOUS
  Filled 2022-04-30: qty 10

## 2022-04-30 MED ORDER — SODIUM CHLORIDE 0.9 % IV SOLN: INTRAVENOUS | Status: DC

## 2022-04-30 MED ORDER — DEXTROSE 5 % IV SOLN: Freq: Once | INTRAVENOUS | Status: AC

## 2022-04-30 MED ORDER — OXALIPLATIN CHEMO INJECTION 100 MG/20ML
130.0000 mg/m2 | Freq: Once | INTRAVENOUS | Status: AC
  Administered 2022-04-30: 275 mg via INTRAVENOUS
  Filled 2022-04-30: qty 55

## 2022-04-30 NOTE — Progress Notes (Signed)
Finished today's treatment and patient was asked how she felt before being released- patient suddenly felt "funny"- her lips tingled, she felt wobbly/lightheaded. She said she felt "motion sick"- Dr. Burr Medico called. Patient was given warm water and a warm blanket. VSS- BP 135/85 (BP Location: Left Arm, Patient Position: Sitting)   Pulse 99   Temp 98.4 F (36.9 C) (Oral)   Resp 18   Wt 211 lb 12 oz (96 kg)   SpO2 98%   BMI 35.24 kg/m  While Dr. Burr Medico was present her BP was 152/101. Patient was laid back in her chair and rested. Dr. Burr Medico decided to give the patient a 228m flush/bolus before she left. When patient left she was having low key cold sensitivity reactions and was very tired, but stable with [facility] as follows- BP (!) 146/82 (BP Location: Right Arm, Patient Position: Sitting)   Pulse 97   Temp 98.1 F (36.7 C) (Oral)   Resp 18   Wt 211 lb 12 oz (96 kg)   SpO2 98%   BMI 35.24 kg/m  Husband present and aware of situation.

## 2022-04-30 NOTE — Patient Instructions (Signed)
[facility]  Discharge Instructions: Thank you for choosing [facility] to provide your oncology and hematology care.   If you have a lab appointment with the [facility], please go directly to the [facility] and check in at the registration area.   Wear comfortable clothing and clothing appropriate for easy access to any Portacath or PICC line.   We strive to give you quality time with your provider. You may need to reschedule your appointment if you arrive late (15 or more minutes).  Arriving late affects you and other patients whose appointments are after yours.  Also, if you miss three or more appointments without notifying the office, you may be dismissed from the clinic at the provider's discretion.      For prescription refill requests, have your pharmacy contact our office and allow 72 hours for refills to be completed.    Today you received the following chemotherapy and/or immunotherapy agents: Oxaliplatin      To help prevent nausea and vomiting after your treatment, we encourage you to take your nausea medication as directed.  BELOW ARE SYMPTOMS THAT SHOULD BE REPORTED IMMEDIATELY: *FEVER GREATER THAN 100.4 F (38 C) OR HIGHER *CHILLS OR SWEATING *NAUSEA AND VOMITING THAT IS NOT CONTROLLED WITH YOUR NAUSEA MEDICATION *UNUSUAL SHORTNESS OF BREATH *UNUSUAL BRUISING OR BLEEDING *URINARY PROBLEMS (pain or burning when urinating, or frequent urination) *BOWEL PROBLEMS (unusual diarrhea, constipation, pain near the anus) TENDERNESS IN MOUTH AND THROAT WITH OR WITHOUT PRESENCE OF ULCERS (sore throat, sores in mouth, or a toothache) UNUSUAL RASH, SWELLING OR PAIN  UNUSUAL VAGINAL DISCHARGE OR ITCHING   Items with * indicate a potential emergency and should be followed up as soon as possible or go to the Emergency Department if any problems should occur.  Please show the CHEMOTHERAPY ALERT CARD or IMMUNOTHERAPY ALERT CARD at check-in  to the Emergency Department and triage nurse.  Should you have questions after your visit or need to cancel or reschedule your appointment, please contact [facility]  Dept: [phone]  and follow the prompts.  Office hours are 8:00 a.m. to 4:30 p.m. Monday - Friday. Please note that voicemails left after 4:00 p.m. may not be returned until the following business day.  We are closed weekends and major holidays. You have access to a nurse at all times for urgent questions. Please call the main number to the clinic Dept: [phone] and follow the prompts.   For any non-urgent questions, you may also contact your provider using MyChart. We now offer e-Visits for anyone 69 and older to request care online for non-urgent symptoms. For details visit [email or web address].   Also download the MyChart app! Go to the app store, search "MyChart", open the app, select [facility], and log in with your MyChart username and password.  Masks are optional in the cancer centers. If you would like for your care team to wear a mask while they are taking care of you, please let them know. For doctor visits, patients may have with them one support person who is at least 59 years old. At this time, visitors are not allowed in the infusion area. Oxaliplatin Injection What is this medication? OXALIPLATIN (ox AL i PLA tin) is a chemotherapy drug. It targets fast dividing cells, like cancer cells, and causes these cells to die. This medicine is used to treat cancers of the colon and rectum, and many other cancers. This medicine may be used  for other purposes; ask your health care provider or pharmacist if you have questions. COMMON BRAND NAME(S): Eloxatin What should I tell my care team before I take this medication? They need to know if you have any of these conditions: heart disease history of irregular heartbeat liver disease low blood counts, like white cells, platelets, or red  blood cells lung or breathing disease, like asthma take medicines that treat or prevent blood clots tingling of the fingers or toes, or other nerve disorder an unusual or allergic reaction to oxaliplatin, other chemotherapy, other medicines, foods, dyes, or preservatives pregnant or trying to get pregnant breast-feeding How should I use this medication? This drug is given as an infusion into a vein. It is administered in a hospital or clinic by a specially trained health care professional. Talk to your pediatrician regarding the use of this medicine in children. Special care may be needed. Overdosage: If you think you have taken too much of this medicine contact a poison control center or emergency room at once. NOTE: This medicine is only for you. Do not share this medicine with others. What if I miss a dose? It is important not to miss a dose. Call your doctor or health care professional if you are unable to keep an appointment. What may interact with this medication? Do not take this medicine with any of the following medications: cisapride dronedarone pimozide thioridazine This medicine may also interact with the following medications: aspirin and aspirin-like medicines certain medicines that treat or prevent blood clots like warfarin, apixaban, dabigatran, and rivaroxaban cisplatin cyclosporine diuretics medicines for infection like acyclovir, adefovir, amphotericin B, bacitracin, cidofovir, foscarnet, ganciclovir, gentamicin, pentamidine, vancomycin NSAIDs, medicines for pain and inflammation, like ibuprofen or naproxen other medicines that prolong the QT interval (an abnormal heart rhythm) pamidronate zoledronic acid This list may not describe all possible interactions. Give your health care provider a list of all the medicines, herbs, non-prescription drugs, or dietary supplements you use. Also tell them if you smoke, drink alcohol, or use illegal drugs. Some items may interact  with your medicine. What should I watch for while using this medication? Your condition will be monitored carefully while you are receiving this medicine. You may need blood work done while you are taking this medicine. This medicine may make you feel generally unwell. This is not uncommon as chemotherapy can affect healthy cells as well as cancer cells. Report any side effects. Continue your course of treatment even though you feel ill unless your healthcare professional tells you to stop. This medicine can make you more sensitive to cold. Do not drink cold drinks or use ice. Cover exposed skin before coming in contact with cold temperatures or cold objects. When out in cold weather wear warm clothing and cover your mouth and nose to warm the air that goes into your lungs. Tell your doctor if you get sensitive to the cold. Do not become pregnant while taking this medicine or for 9 months after stopping it. Women should inform their health care professional if they wish to become pregnant or think they might be pregnant. Men should not father a child while taking this medicine and for 6 months after stopping it. There is potential for serious side effects to an unborn child. Talk to your health care professional for more information. Do not breast-feed a child while taking this medicine or for 3 months after stopping it. This medicine has caused ovarian failure in some women. This medicine may make it more  difficult to get pregnant. Talk to your health care professional if you are concerned about your fertility. This medicine has caused decreased sperm counts in some men. This may make it more difficult to father a child. Talk to your health care professional if you are concerned about your fertility. This medicine may increase your risk of getting an infection. Call your health care professional for advice if you get a fever, chills, or sore throat, or other symptoms of a cold or flu. Do not treat  yourself. Try to avoid being around people who are sick. Avoid taking medicines that contain aspirin, acetaminophen, ibuprofen, naproxen, or ketoprofen unless instructed by your health care professional. These medicines may hide a fever. Be careful brushing or flossing your teeth or using a toothpick because you may get an infection or bleed more easily. If you have any dental work done, tell your dentist you are receiving this medicine. What side effects may I notice from receiving this medication? Side effects that you should report to your doctor or health care professional as soon as possible: allergic reactions like skin rash, itching or hives, swelling of the face, lips, or tongue breathing problems cough low blood counts - this medicine may decrease the number of white blood cells, red blood cells, and platelets. You may be at increased risk for infections and bleeding nausea, vomiting pain, redness, or irritation at site where injected pain, tingling, numbness in the hands or feet signs and symptoms of bleeding such as bloody or black, tarry stools; red or dark brown urine; spitting up blood or brown material that looks like coffee grounds; red spots on the skin; unusual bruising or bleeding from the eyes, gums, or nose signs and symptoms of a dangerous change in heartbeat or heart rhythm like chest pain; dizziness; fast, irregular heartbeat; palpitations; feeling faint or lightheaded; falls signs and symptoms of infection like fever; chills; cough; sore throat; pain or trouble passing urine signs and symptoms of liver injury like dark yellow or brown urine; general ill feeling or flu-like symptoms; light-colored stools; loss of appetite; nausea; right upper belly pain; unusually weak or tired; yellowing of the eyes or skin signs and symptoms of low red blood cells or anemia such as unusually weak or tired; feeling faint or lightheaded; falls signs and symptoms of muscle injury like dark  urine; trouble passing urine or change in the amount of urine; unusually weak or tired; muscle pain; back pain Side effects that usually do not require medical attention (report to your doctor or health care professional if they continue or are bothersome): changes in taste diarrhea gas hair loss loss of appetite mouth sores This list may not describe all possible side effects. Call your doctor for medical advice about side effects. You may report side effects to FDA at 1-800-FDA-1088. Where should I keep my medication? This drug is given in a hospital or clinic and will not be stored at home. NOTE: This sheet is a summary. It may not cover all possible information. If you have questions about this medicine, talk to your doctor, pharmacist, or health care provider.  2023 [employer] Standard (2021-08-24 00:00:00)

## 2022-05-01 ENCOUNTER — Encounter: Payer: Self-pay | Admitting: Nurse Practitioner

## 2022-05-05 ENCOUNTER — Other Ambulatory Visit: Payer: Self-pay

## 2022-05-07 ENCOUNTER — Inpatient Hospital Stay: Payer: BC Managed Care – PPO | Attending: Gynecologic Oncology | Admitting: Hematology

## 2022-05-07 ENCOUNTER — Other Ambulatory Visit: Payer: Self-pay

## 2022-05-07 ENCOUNTER — Other Ambulatory Visit (HOSPITAL_COMMUNITY): Payer: Self-pay

## 2022-05-07 ENCOUNTER — Encounter: Payer: Self-pay | Admitting: Hematology

## 2022-05-07 ENCOUNTER — Inpatient Hospital Stay: Payer: BC Managed Care – PPO

## 2022-05-07 VITALS — BP 122/73 | HR 76 | Temp 98.3°F | Resp 15 | Wt 209.1 lb

## 2022-05-07 DIAGNOSIS — R197 Diarrhea, unspecified: Secondary | ICD-10-CM | POA: Insufficient documentation

## 2022-05-07 DIAGNOSIS — Z8042 Family history of malignant neoplasm of prostate: Secondary | ICD-10-CM | POA: Insufficient documentation

## 2022-05-07 DIAGNOSIS — K648 Other hemorrhoids: Secondary | ICD-10-CM | POA: Diagnosis not present

## 2022-05-07 DIAGNOSIS — Z90710 Acquired absence of both cervix and uterus: Secondary | ICD-10-CM | POA: Diagnosis not present

## 2022-05-07 DIAGNOSIS — K769 Liver disease, unspecified: Secondary | ICD-10-CM | POA: Insufficient documentation

## 2022-05-07 DIAGNOSIS — Z5111 Encounter for antineoplastic chemotherapy: Secondary | ICD-10-CM | POA: Insufficient documentation

## 2022-05-07 DIAGNOSIS — K635 Polyp of colon: Secondary | ICD-10-CM | POA: Insufficient documentation

## 2022-05-07 DIAGNOSIS — Z7952 Long term (current) use of systemic steroids: Secondary | ICD-10-CM | POA: Insufficient documentation

## 2022-05-07 DIAGNOSIS — Z79899 Other long term (current) drug therapy: Secondary | ICD-10-CM | POA: Insufficient documentation

## 2022-05-07 DIAGNOSIS — Z90722 Acquired absence of ovaries, bilateral: Secondary | ICD-10-CM | POA: Insufficient documentation

## 2022-05-07 DIAGNOSIS — K219 Gastro-esophageal reflux disease without esophagitis: Secondary | ICD-10-CM | POA: Insufficient documentation

## 2022-05-07 DIAGNOSIS — Z803 Family history of malignant neoplasm of breast: Secondary | ICD-10-CM | POA: Insufficient documentation

## 2022-05-07 DIAGNOSIS — C562 Malignant neoplasm of left ovary: Secondary | ICD-10-CM | POA: Insufficient documentation

## 2022-05-07 LAB — CBC WITH DIFFERENTIAL (CANCER CENTER ONLY)
Abs Immature Granulocytes: 0.01 10*3/uL (ref 0.00–0.07)
Basophils Absolute: 0 10*3/uL (ref 0.0–0.1)
Basophils Relative: 1 %
Eosinophils Absolute: 0.3 10*3/uL (ref 0.0–0.5)
Eosinophils Relative: 5 %
HCT: 40 % (ref 36.0–46.0)
Hemoglobin: 13.7 g/dL (ref 12.0–15.0)
Immature Granulocytes: 0 %
Lymphocytes Relative: 35 %
Lymphs Abs: 2.3 10*3/uL (ref 0.7–4.0)
MCH: 29 pg (ref 26.0–34.0)
MCHC: 34.3 g/dL (ref 30.0–36.0)
MCV: 84.7 fL (ref 80.0–100.0)
Monocytes Absolute: 0.7 10*3/uL (ref 0.1–1.0)
Monocytes Relative: 10 %
Neutro Abs: 3.2 10*3/uL (ref 1.7–7.7)
Neutrophils Relative %: 49 %
Platelet Count: 291 10*3/uL (ref 150–400)
RBC: 4.72 MIL/uL (ref 3.87–5.11)
RDW: 12.7 % (ref 11.5–15.5)
WBC Count: 6.5 10*3/uL (ref 4.0–10.5)
nRBC: 0 % (ref 0.0–0.2)

## 2022-05-07 LAB — CMP (CANCER CENTER ONLY)
ALT: 44 U/L (ref 0–44)
AST: 26 U/L (ref 15–41)
Albumin: 4.2 g/dL (ref 3.5–5.0)
Alkaline Phosphatase: 48 U/L (ref 38–126)
Anion gap: 4 — ABNORMAL LOW (ref 5–15)
BUN: 14 mg/dL (ref 6–20)
CO2: 27 mmol/L (ref 22–32)
Calcium: 9.5 mg/dL (ref 8.9–10.3)
Chloride: 110 mmol/L (ref 98–111)
Creatinine: 0.65 mg/dL (ref 0.44–1.00)
GFR, Estimated: >60 mL/min (ref >60)
Glucose, Bld: 96 mg/dL (ref 70–99)
Potassium: 4 mmol/L (ref 3.5–5.1)
Sodium: 141 mmol/L (ref 135–145)
Total Bilirubin: 0.5 mg/dL (ref 0.3–1.2)
Total Protein: 6.9 g/dL (ref 6.5–8.1)

## 2022-05-07 NOTE — Progress Notes (Signed)
[place]   Telephone:(336) [phone] Fax:(336) [phone]   Clinic Follow up Note   Patient Care Team: Kathyrn Drown, MD as PCP - General (Family Medicine) Terrance Mass, MD (Inactive) as Consulting Physician (Obstetrics and Gynecology) Jovita Gamma, MD as Consulting Physician (Neurosurgery) Rogene Houston, MD as Consulting Physician (Gastroenterology) Truitt Merle, MD as Consulting Physician (Oncology) Royston Bake, RN as Oncology Nurse Navigator (Oncology)  Date of Service:  05/07/2022  CHIEF COMPLAINT: f/u of ovarian adenocarcinoma  CURRENT THERAPY:  Adjuvant CAPEOX, q21d, starting 04/30/22  -Xeloda dose: 1536m AM, 20040mPM days 1-14  ASSESSMENT & PLAN:  [name] a [age].o. female with   1. Mucinous adenocarcinoma of left ovary, Stage IIB -presented with acute left abdominal pain. CT [facility] on 12/13/21 in ED showed a 6.6 cm hemorrhagic mass. Baseline CA 125 from 01/03/22 was WNL at 9.7. her pain resolved and no recurrent or other symptoms -S/p left salpingo-oophorectomy on 03/12/22 with Dr. TuBerline LopesPath revealed adenocarcinoma, IHC studies were most consistent with lower GI (colorectal) or appendiceal primary. I spoke with pathologist Dr. KaVic Ripperhe agrees with the diagnosis but pointed out that 2% ovarian cancer is intestinal type and IHC will be similar to low GI primary.  -colonoscopy on 03/28/22 with Dr. CiBryan Lemmahowed no suspicious mass, only polyps. Path from polyp biopsy showed tubular adenoma. -baseline CEA on 04/01/22 was WNL.  -PET scan on 04/10/22 showed: no convincing evidence of hypermetabolic disease; postsurgical hypermetabolism in left adnexa and abdominal wall; nonspecific asymmetric right-sided hypermetabolic tonsillar hyperplasia. -enterography on 04/15/22 showed: no small bowel masses or nodularity or tumor along omentum or mesentery; no definite appendiceal tumor. -she began adjuvant CAPEOX on 04/30/22.  She tolerated first infusion  moderately well, had mild dizziness, tingling on her lips, and lack having this after she finished infusion.  We will flush her oxaliplatin for longer than future infusion.  We again discussed avoid any cold exposure after infusion. -She is tolerating Xeloda well, will continue to complete for cycle in a week. -She will return on August 14 for cycle 2 -Patient had many questions about activities and risk for infection after chemo.  I answered to her satisfaction.  2. Incidental findings on images  -She was found to have hypermetabolism in the right tonsil on PET scan, plan to review in ENT conference to see if she needs to be seen by ENT.  My limited exam of her tonsil was unremarkable. -Her CT scan from March 2023 showed a 1.5 cm round lesion in the right hepatic lobe, MRI was recommended for further evaluation.  She had a PET scan done on April 10, 2022 which showed no hypermetabolic activity in the liver, this is unlikely malignant lesion.  Plan to repeat CT scan in January 2024, will hold on MRI of liver for now.    2. Genetics  -will refer her to genetics    PLAN:  -continue Xeloda as prescribed for one more week, then off for one week -lab, f/u, and C2 CAPEOX on 8/14 as scheduled -genetic referral   Addendum  Her PET was reviewed in ENT conference this morning and I will refer her to Dr. SkFredric Dineor evaluation, pt is agreeable, referral sent today.  YaTruitt Merle8/11/2021   No problem-specific Assessment & Plan notes found for this encounter.   SUMMARY OF ONCOLOGIC HISTORY: Oncology History  [place]   EXAM: CT ABDOMEN AND PELVIS  WITH CONTRAST  IMPRESSION: 1. 6.6 cm hyperdense left adnexal/ovarian mass, likely a hemorrhagic cyst/mass. Recommend follow-up pelvic ultrasound. 2. 1.5 cm hyperenhancing lesion in the right hepatic lobe. Most likely differentials include hemangioma, FNH, adenoma. Consider nonemergent  follow-up MRI abdomen with contrast.   01/03/2022 Tumor Marker   CA 125: 9.7 (WNL)   01/14/2022 Imaging   EXAM: MRI ABDOMEN WITHOUT AND WITH CONTRAST  IMPRESSION: Enhancing 13 mm segment VI hepatic lesion measuring 13 mm demonstrates nonspecific imaging characteristics but which is favored to reflect a benign etiology such as focal nodular hyperplasia or a hepatic adenoma. Recommend follow-up MRI in 6 months with and without EOVIST contrast for more definitive characterization and to assess stability.   03/12/2022 Pathology Results   FINAL MICROSCOPIC DIAGNOSIS:   A.   FALLOPIAN TUBE AND OVARY, LEFT, SALPINGO OOPHORECTOMY:  -    Adenocarcinoma, most consistent with metastatic colorectal  adenocarcinoma, see Comment.  -    Fallopian tube, negative for malignancy/intraepithelial carcinoma (STIC).   B.   PELVIC SIDEWALL, RIGHT, BIOPSY:  -    Benign, fibroelastic nodule.  -    Negative for malignancy.   C.   CUL DE SAC, POSTERIOR, BIOPSY:  -    Negative for malignancy.   D.   CUL DE SAC, ANTERIOR, BIOPSY:  -    Negative for malignancy.   E.   PELVIC SIDEWALL, LEFT, BIOPSY:  -    Negative for malignancy.   F.   PARACOLIC GUTTER, LEFT, BIOPSY:  -    Negative for malignancy.   G.   PARACOLIC GUTTER, RIGHT, BIOPSY:  -    Negative for malignancy.   H.   OMENTUM:  -    Negative for malignancy.   I.   OVARY REMNANT, LEFT, EXCISION:  -    Adenocarcinoma, most consistent with metastatic colorectal  adenocarcinoma.   COMMENT:  The tumor was interrogated with immunohistochemical (IHC) stains.  The tumor cells are diffusely and strongly positive for CDX-2, CK20 and mCEA and the tumor cells are CK7 negative.   This immunoprofile strongly favors metastatic colorectal adenocarcinoma.   The tumor cells are PAX8 negative to focally equivocal, a finding which is non-contributory. Primary ovarian mucinous adenocarcinoma can be CK20, CDX-2 and mCEA positive; although the staining pattern is  typically focal and not diffuse, as in this case.  Also, supportive of a metastasis is CK7 negativity (primary ovarian mucinous carcinoma is typically CK7 positive).   There are rare cells with intracytoplasmic mucin  (mucicarmine stain).   The best IHC stain to differentiate between metastatic colorectal  adenocarcinoma and primary ovarian mucinous adenocarcinoma is SATB2. This marker has been ordered and will be performed at [facility].  The result will be reported in an addendum.   Furthermore, the tumor cells are negative for serous markers (p16, p53 (wildtype), WT-1) and negative for endometrioid markers (ER and vimentin).  The Ki-67 mitotic index is high.  The IHC stains and mucicarmine stain have satisfactory controls.    ADDENDUM:  The tumor was interrogated with SATB2 immunohistochemical (IHC) stain, performed at [facility].  The tumor has diffuse strong nuclear reactivity.  This result, in combination with the prior IHC results, is essentially diagnostic of a lower GI (colorectal) or appendiceal primary tumor.  The control is satisfactory.    03/28/2022 Procedure   Colonoscopy, Dr. Bryan Lemma  Impression: - Two 2 to 3 mm polyps in the sigmoid colon, removed with a cold snare. Resected and retrieved. - The remainder of the colon  was normal, including retoflexed views of the right colon. - The examined portion of the ileum was normal. - Non-bleeding internal hemorrhoids.   04/01/2022 Initial Diagnosis   Adenocarcinoma (epithelial) of ovary, left ([employer])   04/17/2022 Cancer Staging   Staging form: Ovary, Fallopian Tube, and Primary Peritoneal Carcinoma, AJCC 8th Edition - Clinical stage from 04/17/2022: FIGO Stage IIB (cT2b, cN0, cM0) - Signed by Truitt Merle, MD on 04/22/2022 Stage prefix: Initial diagnosis Histologic grade (G): GX Histologic grading system: 4 grade system   04/30/2022 -  Chemotherapy   Patient is on Treatment Plan : COLORECTAL Xelox ([place]apeox) q21d        INTERVAL  HISTORY:  [name] is here for a follow up of ovarian adenocarcinoma. She was last seen by me on 04/22/22. She presents to the clinic accompanied by her husband.  She received her first cycle oxaliplatin last week, had a mild reaction right after she completed the infusion, with tingling in her lips, leg heaviness, and "swimmy head".  I saw her in the infusion room, we flushed her IV again, and gave her warm water in the blanket.  Her symptoms resolved.  She did develop fatigue, lightheadedness, some nausea and low appetite a few days after chemo, and 1 day diarrhea on day 3, no fever, chills, bleeding, or productive cough.  Her symptoms has overall improved in the past few days, she is able to function well at home.   All other systems were reviewed with the patient and are negative.  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   BMI 35.0-35.9,adult    Cancer ([place]) 03/12/2022   colon adenoca on ovary   Gall stone    GERD (gastroesophageal reflux disease)    Normal spontaneous vaginal delivery    2   Pneumonia     SURGICAL HISTORY: Past Surgical History:  Procedure Laterality Date   BREAST BIOPSY Left 07/15/2017   2 masses   BUNIONECTOMY  08/2021   COLONOSCOPY N/A 06/09/2014   Procedure: COLONOSCOPY;  Surgeon: Rogene Houston, MD;  Location: [facility];  Service: Endoscopy;  Laterality: N/A;  830-moved to 945 Ann to notify pt   COLONOSCOPY  03/28/2022   HYSTEROTOMY     LAPAROSCOPIC TOTAL HYSTERECTOMY  07/18/2010   RSO   NECK SURGERY  11/2012   c6 c7   ROBOTIC ASSISTED SALPINGO OOPHERECTOMY Left 03/12/2022   Procedure: XI ROBOTIC ASSISTED LEFT SALPINGO OOPHORECTOMY WITH STAGING;  Surgeon: Lafonda Mosses, MD;  Location: [facility];  Service: Gynecology;  Laterality: Left;    I have reviewed the social history and family history with the patient and they are  unchanged from previous note.  ALLERGIES:  is allergic to wound dressing adhesive.  MEDICATIONS:  Current Outpatient Medications  Medication Sig Dispense Refill   BIOTIN PO Take 4 tablets by mouth daily.     capecitabine (XELODA) 500 MG tablet Take 3 tablets (1500 mg total) by mouth in morning and 4 tablets (2000 mg total) by mouth in evening every 12 hours. Take for 14 days then off for 7 days. Take within 30 minutes after meals. 98 tablet 1   Cholecalciferol (VITAMIN D3 PO) Take 1 tablet by mouth daily.     famotidine (PEPCID) 20 MG tablet Take 1 tablet (20 mg total) by mouth 2 (two) times daily. 60 tablet 5   ibuprofen (  ADVIL) 800 MG tablet Take 1 tablet (800 mg total) by mouth every 8 (eight) hours as needed for moderate pain. For AFTER surgery only 30 tablet 0   loratadine (CLARITIN) 10 MG tablet Take 10 mg by mouth daily.     ondansetron (ZOFRAN) 8 MG tablet Take 1 tablet (8 mg total) by mouth 2 (two) times daily as needed for refractory nausea / vomiting. Start on day 3 after chemotherapy. 30 tablet 1   prochlorperazine (COMPAZINE) 10 MG tablet Take 1 tablet (10 mg total) by mouth every 6 (six) hours as needed (Nausea or vomiting). 30 tablet 1   rosuvastatin (CRESTOR) 5 MG tablet TAKE ONE TABLET BY MOUTH ON MONDAYS, WEDNESDAYS, AND FRIDAYS FOR CHOLESTEROL 36 tablet 1   senna-docusate (SENOKOT-S) 8.6-50 MG tablet Take 2 tablets by mouth at bedtime. For AFTER surgery, do not take if having diarrhea 30 tablet 0   venlafaxine XR (EFFEXOR-XR) 75 MG 24 hr capsule Take 1 capsule (75 mg total) by mouth daily with breakfast. 30 capsule 1   vitamin C (ASCORBIC ACID) 500 MG tablet Take 1,000 mg by mouth at bedtime.     No current facility-administered medications for this visit.    PHYSICAL EXAMINATION: ECOG PERFORMANCE STATUS: 1 - Symptomatic but completely ambulatory  Vitals:   05/07/22 1424  BP: 122/73  Pulse: 76  Resp: 15  Temp: 98.3 F (36.8 C)  SpO2: 96%   Wt Readings from Last 3  Encounters:  05/07/22 209 lb 1.6 oz (94.8 kg)  04/30/22 211 lb 12 oz (96 kg)  04/22/22 212 lb 6.4 oz (96.3 kg)     GENERAL:alert, no distress and comfortable SKIN: skin color, texture, turgor are normal, no rashes or significant lesions ENT: Normal mucosa, no secretion, erythema, or enlarged tonsils NECK: supple, thyroid normal size, non-tender, without nodularity LYMPH:  no palpable lymphadenopathy in the cervical, axillary  LUNGS: clear to auscultation and percussion with normal breathing effort HEART: regular rate & rhythm and no murmurs and no lower extremity edema ABDOMEN:abdomen soft, non-tender and normal bowel sounds Musculoskeletal:no cyanosis of digits and no clubbing  NEURO: alert & oriented x 3 with fluent speech, no focal motor/sensory deficits  LABORATORY DATA:  I have reviewed the data as listed    Latest Ref Rng & Units 05/07/2022    1:59 PM 04/30/2022    9:00 AM 03/05/2022    9:22 AM  CBC  WBC 4.0 - 10.5 K/uL 6.5  4.6  7.4   Hemoglobin 12.0 - 15.0 g/dL 13.7  14.3  13.6   Hematocrit 36.0 - 46.0 % 40.0  42.0  40.9   Platelets 150 - 400 K/uL 291  312  336         Latest Ref Rng & Units 05/07/2022    1:59 PM 04/30/2022    9:00 AM 03/05/2022    9:22 AM  CMP  Glucose 70 - 99 mg/dL 96  100  90   BUN 6 - 20 mg/dL '14  13  20   ' Creatinine 0.44 - 1.00 mg/dL 0.65  0.70  0.80   Sodium 135 - 145 mmol/L 141  141  141   Potassium 3.5 - 5.1 mmol/L 4.0  4.3  4.0   Chloride 98 - 111 mmol/L 110  108  107   CO2 22 - 32 mmol/L '27  28  27   ' Calcium 8.9 - 10.3 mg/dL 9.5  9.7  8.7   Total Protein 6.5 - 8.1 g/dL 6.9  6.9  6.9   Total Bilirubin 0.3 - 1.2 mg/dL 0.5  0.5  0.7   Alkaline Phos 38 - 126 U/L 48  58  51   AST 15 - 41 U/L '26  26  14   ' ALT 0 - 44 U/L 44  46  19       RADIOGRAPHIC STUDIES: I have personally reviewed the radiological images as listed and agreed with the findings in the report. No results found.    Orders Placed This Encounter  Procedures   Ambulatory  referral to Genetics    Referral Priority:   Routine    Referral Type:   Consultation    Referral Reason:   Specialty Services Required    Number of Visits Requested:   1   All questions were answered. The patient knows to call the clinic with any problems, questions or concerns. No barriers to learning was detected. The total time spent in the appointment was 30 minutes.     Truitt Merle, MD 05/07/2022   I, Wilburn Mylar, am acting as scribe for Truitt Merle, MD.   I have reviewed the above documentation for accuracy and completeness, and I agree with the above.

## 2022-05-08 ENCOUNTER — Other Ambulatory Visit: Payer: Self-pay

## 2022-05-08 ENCOUNTER — Other Ambulatory Visit (HOSPITAL_COMMUNITY): Payer: Self-pay

## 2022-05-08 ENCOUNTER — Encounter: Payer: Self-pay | Admitting: *Deleted

## 2022-05-08 NOTE — Progress Notes (Signed)
Per Dr. Ernestina Penna request, I called referral to Dr. Dorathy Kinsman office and made appointment for patient on 05/22/22 at 3:00 PM.  I called patient and gave her appointment information and office location.  I also entered information in [place].

## 2022-05-08 NOTE — Addendum Note (Signed)
Addended by: Truitt Merle on: 05/08/2022 11:38 AM   Modules accepted: Orders

## 2022-05-10 ENCOUNTER — Encounter: Payer: Self-pay | Admitting: Hematology

## 2022-05-10 ENCOUNTER — Other Ambulatory Visit (HOSPITAL_COMMUNITY): Payer: Self-pay

## 2022-05-10 ENCOUNTER — Other Ambulatory Visit: Payer: Self-pay

## 2022-05-13 ENCOUNTER — Other Ambulatory Visit (HOSPITAL_COMMUNITY): Payer: Self-pay

## 2022-05-14 ENCOUNTER — Other Ambulatory Visit (HOSPITAL_COMMUNITY): Payer: Self-pay

## 2022-05-15 ENCOUNTER — Encounter: Payer: Self-pay | Admitting: Nurse Practitioner

## 2022-05-15 ENCOUNTER — Encounter: Payer: Self-pay | Admitting: Family Medicine

## 2022-05-15 MED ORDER — CEPHALEXIN 500 MG PO CAPS: 500.0000 mg | ORAL_CAPSULE | Freq: Three times a day (TID) | ORAL | 0 refills | Status: DC

## 2022-05-15 NOTE — Telephone Encounter (Signed)
Nurses I read over the information from [place].  I would recommend warm compresses 5 to 10 minutes at a time several times per day.  Also I recommend Keflex 500 mg 1 taken 3 times daily for 5 days.  If any ongoing troubles feel free to connect with [place].  This should not interfere with any future treatments.  Thanks-Dr. Nicki Reaper

## 2022-05-17 ENCOUNTER — Encounter: Payer: Self-pay | Admitting: Hematology

## 2022-05-17 MED FILL — Dexamethasone Sodium Phosphate Inj 100 MG/10ML: INTRAMUSCULAR | Qty: 1 | Status: AC

## 2022-05-17 NOTE — Progress Notes (Signed)
Called pt to introduce myself as her Arboriculturist and to discuss the [employer] and the [employer].  Unfortunately there aren't any foundations offering copay assistance for her treatment.  I left a msg requesting she return my call if she's interested in applying for the grants.

## 2022-05-19 NOTE — Progress Notes (Signed)
[facility]   [phone] [phone]   Clinic Follow up Note   Patient Care Team: Susan Drown, MD as PCP - General (Family Medicine) Susan Mass, MD (Inactive) as Consulting Physician (Obstetrics and Gynecology) Susan Gamma, MD as Consulting Physician (Neurosurgery) Susan Houston, MD as Consulting Physician (Gastroenterology) Susan Merle, MD as Consulting Physician (Oncology) Susan Bake, RN as Oncology Nurse Navigator (Oncology) 05/20/2022  CHIEF COMPLAINT: Follow up ovarian cancer   SUMMARY OF ONCOLOGIC HISTORY: Oncology History  Adenocarcinoma (epithelial) of ovary, left ([place])  12/13/2021 Imaging   EXAM: CT ABDOMEN AND PELVIS WITH CONTRAST  IMPRESSION: 1. 6.6 cm hyperdense left adnexal/ovarian Hardin, likely a hemorrhagic cyst/Hardin. Recommend follow-up pelvic ultrasound. 2. 1.5 cm hyperenhancing lesion in the right hepatic lobe. Most likely differentials include hemangioma, FNH, adenoma. Consider nonemergent follow-up MRI abdomen with contrast.   01/03/2022 Tumor Marker   CA 125: 9.7 (WNL)   01/14/2022 Imaging   EXAM: MRI ABDOMEN WITHOUT AND WITH CONTRAST  IMPRESSION: Enhancing 13 mm segment VI hepatic lesion measuring 13 mm demonstrates nonspecific imaging characteristics but which is favored to reflect a benign etiology such as focal nodular hyperplasia or a hepatic adenoma. Recommend follow-up MRI in 6 months with and without EOVIST contrast for more definitive characterization and to assess stability.   03/12/2022 Pathology Results   FINAL MICROSCOPIC DIAGNOSIS:   A.   FALLOPIAN TUBE AND OVARY, LEFT, SALPINGO OOPHORECTOMY:  -    Adenocarcinoma, most consistent with metastatic colorectal  adenocarcinoma, see Comment.  -    Fallopian tube, negative for malignancy/intraepithelial carcinoma (STIC).   B.   PELVIC SIDEWALL, RIGHT, BIOPSY:  -    Benign, fibroelastic nodule.  -    Negative for malignancy.   C.   CUL  DE SAC, POSTERIOR, BIOPSY:  -    Negative for malignancy.   D.   CUL DE SAC, ANTERIOR, BIOPSY:  -    Negative for malignancy.   E.   PELVIC SIDEWALL, LEFT, BIOPSY:  -    Negative for malignancy.   F.   PARACOLIC GUTTER, LEFT, BIOPSY:  -    Negative for malignancy.   G.   PARACOLIC GUTTER, RIGHT, BIOPSY:  -    Negative for malignancy.   H.   OMENTUM:  -    Negative for malignancy.   I.   OVARY REMNANT, LEFT, EXCISION:  -    Adenocarcinoma, most consistent with metastatic colorectal  adenocarcinoma.   COMMENT:  The tumor was interrogated with immunohistochemical (IHC) stains.  The tumor cells are diffusely and strongly positive for CDX-2, CK20 and mCEA and the tumor cells are CK7 negative.   This immunoprofile strongly favors metastatic colorectal adenocarcinoma.   The tumor cells are PAX8 negative to focally equivocal, a finding which is non-contributory. Primary ovarian mucinous adenocarcinoma can be CK20, CDX-2 and mCEA positive; although the staining pattern is typically focal and not diffuse, as in this case.  Also, supportive of a metastasis is CK7 negativity (primary ovarian mucinous carcinoma is typically CK7 positive).   There are rare cells with intracytoplasmic mucin  (mucicarmine stain).   The best IHC stain to differentiate between metastatic colorectal  adenocarcinoma and primary ovarian mucinous adenocarcinoma is SATB2. This marker has been ordered and will be performed at [facility].  The result will be reported in an addendum.   Furthermore, the tumor cells are negative for serous markers (p16, p53 (wildtype), WT-1) and negative for endometrioid markers (ER and vimentin).  The Ki-67 mitotic index is high.  The IHC stains and mucicarmine stain have satisfactory controls.    ADDENDUM:  The tumor was interrogated with SATB2 immunohistochemical (IHC) stain, performed at [facility].  The tumor has diffuse strong nuclear reactivity.  This result, in combination with the  prior IHC results, is essentially diagnostic of a lower GI (colorectal) or appendiceal primary tumor.  The control is satisfactory.    03/28/2022 Procedure   Colonoscopy, [name]  Impression: - Two 2 to 3 mm polyps in the sigmoid colon, removed with a cold snare. Resected and retrieved. - The remainder of the colon was normal, including retoflexed views of the right colon. - The examined portion of the ileum was normal. - Non-bleeding internal hemorrhoids.   04/01/2022 Initial Diagnosis   Adenocarcinoma (epithelial) of ovary, left ([place])   04/17/2022 Cancer Staging   Staging form: Ovary, Fallopian Tube, and Primary Peritoneal Carcinoma, AJCC 8th Edition - Clinical stage from 04/17/2022: FIGO Stage IIB (cT2b, cN0, cM0) - Signed by Susan Merle, MD on 04/22/2022 Stage prefix: Initial diagnosis Histologic grade (G): GX Histologic grading system: 4 grade system   04/30/2022 -  Chemotherapy   Patient is on Treatment Plan : COLORECTAL Xelox (Capeox) q21d       CURRENT THERAPY:  Adjuvant CapeOx q21 days, starting 04/30/22; Xeloda dose 1500 mg AM, 2000 mg PM days 1-14  INTERVAL HISTORY: [name] returns for follow up as scheduled. Last seen by [name] 05/07/22 for toxicity check.  She only felt bad for 1 day, compared it to morning sickness from pregnancy, where she felt nauseated, tired, averse to certain smells, and had diarrhea.  She began Imodium and this resolved.  She took Zofran with morning dose of Xeloda and tolerated the remainder well.  Cold sensitivity lasted 2 weeks, no residual neuropathy in the absence of cold.  Plus vitamin E to her hands, no redness, peeling, cracks.  Denies fever, chills, cough, chest pain, dyspnea, leg edema, pain, or other new specific complaints.  She began cycle 2 Xeloda this morning.  All other systems were reviewed with the patient and are negative.  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   BMI 35.0-35.9,adult    Cancer ([place]) 03/12/2022   colon adenoca on ovary   Gall stone    GERD (gastroesophageal reflux disease)    Normal spontaneous vaginal delivery    2   Pneumonia     SURGICAL HISTORY: Past Surgical History:  Procedure Laterality Date   BREAST BIOPSY Left 07/15/2017   2 masses   BUNIONECTOMY  08/2021   COLONOSCOPY N/A 06/09/2014   Procedure: COLONOSCOPY;  Surgeon: Susan Houston, MD;  Location: [facility];  Service: Endoscopy;  Laterality: N/A;  830-moved to 945 Ann to notify pt   COLONOSCOPY  03/28/2022   HYSTEROTOMY     LAPAROSCOPIC TOTAL HYSTERECTOMY  07/18/2010   RSO   NECK SURGERY  11/2012   c6 c7   ROBOTIC ASSISTED SALPINGO OOPHERECTOMY Left 03/12/2022   Procedure: XI ROBOTIC ASSISTED LEFT SALPINGO OOPHORECTOMY WITH STAGING;  Surgeon: Lafonda Mosses, MD;  Location: [facility] ORS;  Service: Gynecology;  Laterality: Left;    I have reviewed the social history and family history with the patient and they are unchanged from previous note.  ALLERGIES:  is allergic to wound dressing adhesive.  MEDICATIONS:  Current Outpatient Medications  Medication Sig Dispense Refill   BIOTIN PO Take 4 tablets by mouth daily.     capecitabine (XELODA) 500 MG tablet Take 3 tablets (1500 mg total) by mouth in morning and 4 tablets (2000 mg total) by mouth in evening every 12 hours. Take for 14 days then off for 7 days. Take within 30 minutes after meals. 98 tablet 1   cephALEXin (KEFLEX) 500 MG capsule Take 1 capsule (500 mg total) by mouth 3 (three) times daily for 5 days. 15 capsule 0   Cholecalciferol (VITAMIN D3 PO) Take 1 tablet by mouth daily.     famotidine (PEPCID) 20 MG tablet Take 1 tablet (20 mg total) by mouth 2 (two) times daily. 60 tablet 5   ibuprofen (ADVIL) 800 MG tablet Take 1 tablet (800 mg total) by mouth every 8 (eight) hours as needed for moderate pain. For AFTER surgery only 30 tablet 0    loratadine (CLARITIN) 10 MG tablet Take 10 mg by mouth daily.     ondansetron (ZOFRAN) 8 MG tablet Take 1 tablet (8 mg total) by mouth 2 (two) times daily as needed for refractory nausea / vomiting. Start on day 3 after chemotherapy. 30 tablet 1   prochlorperazine (COMPAZINE) 10 MG tablet Take 1 tablet (10 mg total) by mouth every 6 (six) hours as needed (Nausea or vomiting). 30 tablet 1   rosuvastatin (CRESTOR) 5 MG tablet TAKE ONE TABLET BY MOUTH ON MONDAYS, WEDNESDAYS, AND FRIDAYS FOR CHOLESTEROL 36 tablet 1   senna-docusate (SENOKOT-S) 8.6-50 MG tablet Take 2 tablets by mouth at bedtime. For AFTER surgery, do not take if having diarrhea 30 tablet 0   venlafaxine XR (EFFEXOR-XR) 75 MG 24 hr capsule Take 1 capsule (75 mg total) by mouth daily with breakfast. 30 capsule 1   vitamin C (ASCORBIC ACID) 500 MG tablet Take 1,000 mg by mouth at bedtime.     No current facility-administered medications for this visit.   Facility-Administered Medications Ordered in Other Visits  Medication Dose Route Frequency Provider Last Rate Last Admin   dexamethasone (DECADRON) 10 mg in sodium chloride 0.9 % 50 mL IVPB  10 mg Intravenous Once Susan Merle, MD 204 mL/hr at 05/20/22 1109 10 mg at 05/20/22 1109   oxaliplatin (ELOXATIN) 275 mg in dextrose 5 % 500 mL chemo infusion  130 mg/m2 (Treatment Plan Recorded) Intravenous Once Susan Merle, MD        PHYSICAL EXAMINATION: ECOG PERFORMANCE STATUS: 1 - Symptomatic but completely ambulatory  Vitals:   05/20/22 0945  BP: (!) 147/69  Pulse: 89  Resp: 14  Temp: 98.3 F (36.8 C)  SpO2: 97%   Filed Weights   05/20/22 0945  Weight: 213 lb 3.2 oz (96.7 kg)    GENERAL:alert, no distress and comfortable SKIN: No rash.  Palms without erythema EYES: sclera clear.  Resolving small hordeolum to the right lower lid LUNGS: clear with normal breathing effort HEART: regular rate & rhythm, no lower extremity edema ABDOMEN:abdomen soft, non-tender and normal bowel  sounds NEURO: alert & oriented x 3 with fluent speech, no focal motor/sensory deficits  LABORATORY DATA:  I have reviewed the data as listed    Latest Ref Rng & Units 05/20/2022    9:29 AM 05/07/2022    1:59 PM 04/30/2022    9:00 AM  CBC  WBC 4.0 - 10.5 K/uL 5.5  6.5  4.6   Hemoglobin 12.0 - 15.0 g/dL 14.4  13.7  14.3   Hematocrit 36.0 - 46.0 % 40.9  40.0  42.0   Platelets 150 - 400 K/uL 284  291  312         Latest Ref Rng & Units 05/20/2022    9:29 AM 05/07/2022    1:59 PM 04/30/2022    9:00 AM  CMP  Glucose 70 - 99 mg/dL 125  96  100   BUN 6 - 20 mg/dL _0 Creatinine 0.44 - 1.00 mg/dL 0.66  0.65  0.70   Sodium 135 - 145 mmol/L 139  141  141   Potassium 3.5 - 5.1 mmol/L 3.6  4.0  4.3   Chloride 98 - 111 mmol/L 108  110  108   CO2 22 - 32 mmol/L _1 Calcium 8.9 - 10.3 mg/dL 9.1  9.5  9.7   Total Protein 6.5 - 8.1 g/dL 6.9  6.9  6.9   Total Bilirubin 0.3 - 1.2 mg/dL 0.5  0.5  0.5   Alkaline Phos 38 - 126 U/L 65  48  58   AST 15 - 41 U/L 41  26  26   ALT 0 - 44 U/L 60  44  46       RADIOGRAPHIC STUDIES: I have personally reviewed the radiological images as listed and agreed with the findings in the report. No results found.   ASSESSMENT & PLAN: [name] is a 61 y.o. female with    1. Mucinous adenocarcinoma of left ovary, Stage IIB -presented with acute left abdominal pain. CT [facility] on 12/13/21 in ED showed a 6.6 cm hemorrhagic Hardin. Baseline CA 125 from 01/03/22 was WNL at 9.7. her pain resolved and no recurrent or other symptoms -S/p left salpingo-oophorectomy on 03/12/22 with Dr. Berline Lopes. 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  [name] spoke with pathologist Dr. Vic Ripper, he agrees with the diagnosis but pointed out that 2% ovarian cancer is intestinal type and IHC will be similar to low GI primary.  -colonoscopy on 03/28/22 with [name] showed no suspicious Hardin, only polyps.  Path from polyp biopsy showed tubular adenoma. -baseline CEA on 04/01/22 was WNL.  -PET scan on 04/10/22 showed: no convincing evidence of hypermetabolic disease; postsurgical hypermetabolism in left adnexa and abdominal wall; nonspecific asymmetric right-sided hypermetabolic tonsillar hyperplasia. This was reviewed in ENT tumor board and has an appointment with Dr. Fredric Dine on 8/16 -enterography on 04/15/22 showed: no small bowel masses or nodularity or tumor along omentum or mesentery; no definite appendiceal tumor. -she began adjuvant CAPEOX on 04/30/22.  She tolerated first infusion moderately well, had mild arm tingling and nausea at the end of oxali infusion. Will flush longer with 30 minute observation with today's treatment.  -[name] appears stable. She tolerated cycle 1 with 1 day of fatigue, nausea, diarrhea, and smell aversion. Cold sensitivity resolved after 2 weeks. She has recovered well. -She has a small stye on the right eye that has nearly resolved. She will complete 1 more day of keflex. -Labs reviewed. CBC WNL. CMP shows BG 125 and ALT 60, likely treatment related.  Labs adequate for treatment -She will proceed with cycle 2 CapeOx today as planned, same dose. -Patient and her spouse had many questions about her diagnosis, treatment trajectory and symptom management, and genetic testing.  I answered to their satisfaction -Follow-up in 3 weeks with cycle 3   2. Incidental findings on images  -She was found to have hypermetabolism  in the right tonsil on PET scan, per discussion in ENT conference he has been referred to Dr. Fredric Dine -Her CT scan from March 2023 showed a 1.5 cm round lesion in the right hepatic lobe, MRI was recommended for further evaluation.  She had a PET scan done on April 10, 2022 which showed no hypermetabolic activity in the liver, this is unlikely malignant lesion.  Plan to repeat CT scan in January 2024, will hold on MRI of liver for now.    2. Genetics  -Due  to her history and family history of a sister with breast cancer and multiple brothers and her father with prostate cancer, she qualifies for genetic testing; appointment 9/25 -Patient's spouse has questions about genetic testing for him, given that he has a mother with breast cancer and a maternal aunt with ovarian cancer and possibly a genetic predisposition for cancer (?BRCA mutation) .  I encouraged him to discuss with our genetic counselor   PLAN: -Labs reviewed, proceed with cycle 2 CapeOx today as planned, same dose -ENT 8/16 -Follow-up in 3 weeks with cycle 3 Genetics 9/25 as scheduled   All questions were answered. The patient knows to call the clinic with any problems, questions or concerns. No barriers to learning was detected. I spent 20 minutes counseling the patient face to face. The total time spent in the appointment was 30 minutes and more than 50% was on counseling and review of test results.     Alla Feeling, NP 05/20/22

## 2022-05-20 ENCOUNTER — Inpatient Hospital Stay (HOSPITAL_BASED_OUTPATIENT_CLINIC_OR_DEPARTMENT_OTHER): Payer: BC Managed Care – PPO | Admitting: Nurse Practitioner

## 2022-05-20 ENCOUNTER — Inpatient Hospital Stay: Payer: BC Managed Care – PPO

## 2022-05-20 ENCOUNTER — Other Ambulatory Visit: Payer: Self-pay

## 2022-05-20 ENCOUNTER — Encounter: Payer: Self-pay | Admitting: Nurse Practitioner

## 2022-05-20 VITALS — BP 150/70 | HR 93 | Temp 98.3°F | Resp 16

## 2022-05-20 VITALS — BP 147/69 | HR 89 | Temp 98.3°F | Resp 14 | Ht 65.0 in | Wt 213.2 lb

## 2022-05-20 DIAGNOSIS — C562 Malignant neoplasm of left ovary: Secondary | ICD-10-CM

## 2022-05-20 LAB — CBC WITH DIFFERENTIAL/PLATELET
Abs Immature Granulocytes: 0.01 10*3/uL (ref 0.00–0.07)
Basophils Absolute: 0 10*3/uL (ref 0.0–0.1)
Basophils Relative: 0 %
Eosinophils Absolute: 0.4 10*3/uL (ref 0.0–0.5)
Eosinophils Relative: 7 %
HCT: 40.9 % (ref 36.0–46.0)
Hemoglobin: 14.4 g/dL (ref 12.0–15.0)
Immature Granulocytes: 0 %
Lymphocytes Relative: 36 %
Lymphs Abs: 2 10*3/uL (ref 0.7–4.0)
MCH: 29.9 pg (ref 26.0–34.0)
MCHC: 35.2 g/dL (ref 30.0–36.0)
MCV: 85 fL (ref 80.0–100.0)
Monocytes Absolute: 0.5 10*3/uL (ref 0.1–1.0)
Monocytes Relative: 10 %
Neutro Abs: 2.6 10*3/uL (ref 1.7–7.7)
Neutrophils Relative %: 47 %
Platelets: 284 10*3/uL (ref 150–400)
RBC: 4.81 MIL/uL (ref 3.87–5.11)
RDW: 14.6 % (ref 11.5–15.5)
WBC: 5.5 10*3/uL (ref 4.0–10.5)
nRBC: 0 % (ref 0.0–0.2)

## 2022-05-20 LAB — COMPREHENSIVE METABOLIC PANEL
ALT: 60 U/L — ABNORMAL HIGH (ref 0–44)
AST: 41 U/L (ref 15–41)
Albumin: 4.3 g/dL (ref 3.5–5.0)
Alkaline Phosphatase: 65 U/L (ref 38–126)
Anion gap: 4 — ABNORMAL LOW (ref 5–15)
BUN: 12 mg/dL (ref 6–20)
CO2: 27 mmol/L (ref 22–32)
Calcium: 9.1 mg/dL (ref 8.9–10.3)
Chloride: 108 mmol/L (ref 98–111)
Creatinine, Ser: 0.66 mg/dL (ref 0.44–1.00)
GFR, Estimated: >60 mL/min (ref >60)
Glucose, Bld: 125 mg/dL — ABNORMAL HIGH (ref 70–99)
Potassium: 3.6 mmol/L (ref 3.5–5.1)
Sodium: 139 mmol/L (ref 135–145)
Total Bilirubin: 0.5 mg/dL (ref 0.3–1.2)
Total Protein: 6.9 g/dL (ref 6.5–8.1)

## 2022-05-20 MED ORDER — DEXTROSE 5 % IV SOLN: Freq: Once | INTRAVENOUS | Status: AC

## 2022-05-20 MED ORDER — SODIUM CHLORIDE 0.9 % IV SOLN
10.0000 mg | Freq: Once | INTRAVENOUS | Status: AC
  Administered 2022-05-20: 10 mg via INTRAVENOUS
  Filled 2022-05-20: qty 10

## 2022-05-20 MED ORDER — PALONOSETRON HCL INJECTION 0.25 MG/5ML
0.2500 mg | Freq: Once | INTRAVENOUS | Status: AC
  Administered 2022-05-20: 0.25 mg via INTRAVENOUS

## 2022-05-20 MED ORDER — OXALIPLATIN CHEMO INJECTION 100 MG/20ML
130.0000 mg/m2 | Freq: Once | INTRAVENOUS | Status: AC
  Administered 2022-05-20: 275 mg via INTRAVENOUS
  Filled 2022-05-20: qty 55

## 2022-05-20 NOTE — Patient Instructions (Signed)
[facility] MEDICAL ONCOLOGY  Discharge Instructions: Thank you for choosing [facility] to provide your oncology and hematology care.   If you have a lab appointment with the Cancer Center, please go directly to the Cancer Center and check in at the registration area.   Wear comfortable clothing and clothing appropriate for easy access to any Portacath or PICC line.   We strive to give you quality time with your provider. You may need to reschedule your appointment if you arrive late (15 or more minutes).  Arriving late affects you and other patients whose appointments are after yours.  Also, if you miss three or more appointments without notifying the office, you may be dismissed from the clinic at the provider's discretion.      For prescription refill requests, have your pharmacy contact our office and allow 72 hours for refills to be completed.    Today you received the following chemotherapy and/or immunotherapy agents: Oxaliplatin      To help prevent nausea and vomiting after your treatment, we encourage you to take your nausea medication as directed.  BELOW ARE SYMPTOMS THAT SHOULD BE REPORTED IMMEDIATELY: *FEVER GREATER THAN 100.4 F (38 C) OR HIGHER *CHILLS OR SWEATING *NAUSEA AND VOMITING THAT IS NOT CONTROLLED WITH YOUR NAUSEA MEDICATION *UNUSUAL SHORTNESS OF BREATH *UNUSUAL BRUISING OR BLEEDING *URINARY PROBLEMS (pain or burning when urinating, or frequent urination) *BOWEL PROBLEMS (unusual diarrhea, constipation, pain near the anus) TENDERNESS IN MOUTH AND THROAT WITH OR WITHOUT PRESENCE OF ULCERS (sore throat, sores in mouth, or a toothache) UNUSUAL RASH, SWELLING OR PAIN  UNUSUAL VAGINAL DISCHARGE OR ITCHING   Items with * indicate a potential emergency and should be followed up as soon as possible or go to the Emergency Department if any problems should occur.  Please show the CHEMOTHERAPY ALERT CARD or IMMUNOTHERAPY ALERT CARD at check-in  to the Emergency Department and triage nurse.  Should you have questions after your visit or need to cancel or reschedule your appointment, please contact [facility] MEDICAL ONCOLOGY  Dept: [phone]  and follow the prompts.  Office hours are 8:00 a.m. to 4:30 p.m. Monday - Friday. Please note that voicemails left after 4:00 p.m. may not be returned until the following business day.  We are closed weekends and major holidays. You have access to a nurse at all times for urgent questions. Please call the main number to the clinic Dept: [phone] and follow the prompts.   For any non-urgent questions, you may also contact your provider using MyChart. We now offer e-Visits for anyone 18 and older to request care online for non-urgent symptoms. For details visit [email or web address].   Also download the MyChart app! Go to the app store, search "MyChart", open the app, select [facility], and log in with your MyChart username and password.  Masks are optional in the cancer centers. If you would like for your care team to wear a mask while they are taking care of you, please let them know. You may have one support person who is at least 59 years old accompany you for your appointments. 

## 2022-05-21 ENCOUNTER — Telehealth: Payer: Self-pay | Admitting: Hematology

## 2022-05-21 ENCOUNTER — Encounter: Payer: Self-pay | Admitting: Nurse Practitioner

## 2022-05-21 NOTE — Telephone Encounter (Signed)
Left message with follow-up appointments per 8/14 los.

## 2022-05-22 ENCOUNTER — Other Ambulatory Visit: Payer: Self-pay

## 2022-05-30 ENCOUNTER — Other Ambulatory Visit (HOSPITAL_COMMUNITY): Payer: Self-pay

## 2022-05-30 ENCOUNTER — Other Ambulatory Visit: Payer: Self-pay | Admitting: Hematology

## 2022-05-30 DIAGNOSIS — C562 Malignant neoplasm of left ovary: Secondary | ICD-10-CM

## 2022-05-30 MED ORDER — CAPECITABINE 500 MG PO TABS
ORAL_TABLET | ORAL | 1 refills | Status: DC
  Filled 2022-05-30: qty 98, 21d supply, fill #0
  Filled 2022-07-03: qty 98, 21d supply, fill #1

## 2022-05-31 ENCOUNTER — Other Ambulatory Visit: Payer: Self-pay | Admitting: Hematology

## 2022-05-31 DIAGNOSIS — C562 Malignant neoplasm of left ovary: Secondary | ICD-10-CM

## 2022-06-01 ENCOUNTER — Encounter: Payer: Self-pay | Admitting: Family Medicine

## 2022-06-05 ENCOUNTER — Other Ambulatory Visit (HOSPITAL_COMMUNITY): Payer: Self-pay

## 2022-06-10 ENCOUNTER — Other Ambulatory Visit: Payer: Self-pay | Admitting: Hematology

## 2022-06-10 DIAGNOSIS — C562 Malignant neoplasm of left ovary: Secondary | ICD-10-CM

## 2022-06-11 MED FILL — Dexamethasone Sodium Phosphate Inj 100 MG/10ML: INTRAMUSCULAR | Qty: 1 | Status: AC

## 2022-06-12 ENCOUNTER — Inpatient Hospital Stay: Payer: BC Managed Care – PPO

## 2022-06-12 ENCOUNTER — Inpatient Hospital Stay: Payer: BC Managed Care – PPO | Attending: Gynecologic Oncology

## 2022-06-12 ENCOUNTER — Other Ambulatory Visit: Payer: Self-pay

## 2022-06-12 ENCOUNTER — Encounter: Payer: Self-pay | Admitting: Hematology

## 2022-06-12 ENCOUNTER — Inpatient Hospital Stay (HOSPITAL_BASED_OUTPATIENT_CLINIC_OR_DEPARTMENT_OTHER): Payer: BC Managed Care – PPO | Admitting: Hematology

## 2022-06-12 VITALS — BP 144/88 | HR 86 | Temp 98.2°F | Resp 18 | Ht 65.0 in | Wt 210.8 lb

## 2022-06-12 VITALS — BP 153/84 | HR 82 | Resp 17

## 2022-06-12 DIAGNOSIS — R197 Diarrhea, unspecified: Secondary | ICD-10-CM | POA: Diagnosis not present

## 2022-06-12 DIAGNOSIS — C562 Malignant neoplasm of left ovary: Secondary | ICD-10-CM

## 2022-06-12 DIAGNOSIS — Z79899 Other long term (current) drug therapy: Secondary | ICD-10-CM | POA: Diagnosis not present

## 2022-06-12 DIAGNOSIS — C787 Secondary malignant neoplasm of liver and intrahepatic bile duct: Secondary | ICD-10-CM | POA: Insufficient documentation

## 2022-06-12 DIAGNOSIS — R5383 Other fatigue: Secondary | ICD-10-CM | POA: Insufficient documentation

## 2022-06-12 DIAGNOSIS — K219 Gastro-esophageal reflux disease without esophagitis: Secondary | ICD-10-CM | POA: Insufficient documentation

## 2022-06-12 DIAGNOSIS — Z5111 Encounter for antineoplastic chemotherapy: Secondary | ICD-10-CM | POA: Insufficient documentation

## 2022-06-12 LAB — CBC WITH DIFFERENTIAL (CANCER CENTER ONLY)
Abs Immature Granulocytes: 0.01 10*3/uL (ref 0.00–0.07)
Basophils Absolute: 0 10*3/uL (ref 0.0–0.1)
Basophils Relative: 1 %
Eosinophils Absolute: 0.3 10*3/uL (ref 0.0–0.5)
Eosinophils Relative: 7 %
HCT: 38.5 % (ref 36.0–46.0)
Hemoglobin: 13.5 g/dL (ref 12.0–15.0)
Immature Granulocytes: 0 %
Lymphocytes Relative: 38 %
Lymphs Abs: 1.7 10*3/uL (ref 0.7–4.0)
MCH: 30.5 pg (ref 26.0–34.0)
MCHC: 35.1 g/dL (ref 30.0–36.0)
MCV: 87.1 fL (ref 80.0–100.0)
Monocytes Absolute: 0.7 10*3/uL (ref 0.1–1.0)
Monocytes Relative: 16 %
Neutro Abs: 1.6 10*3/uL — ABNORMAL LOW (ref 1.7–7.7)
Neutrophils Relative %: 38 %
Platelet Count: 186 10*3/uL (ref 150–400)
RBC: 4.42 MIL/uL (ref 3.87–5.11)
RDW: 17.7 % — ABNORMAL HIGH (ref 11.5–15.5)
WBC Count: 4.3 10*3/uL (ref 4.0–10.5)
nRBC: 0 % (ref 0.0–0.2)

## 2022-06-12 LAB — CMP (CANCER CENTER ONLY)
ALT: 129 U/L — ABNORMAL HIGH (ref 0–44)
AST: 77 U/L — ABNORMAL HIGH (ref 15–41)
Albumin: 4 g/dL (ref 3.5–5.0)
Alkaline Phosphatase: 79 U/L (ref 38–126)
Anion gap: 5 (ref 5–15)
BUN: 11 mg/dL (ref 6–20)
CO2: 26 mmol/L (ref 22–32)
Calcium: 9.6 mg/dL (ref 8.9–10.3)
Chloride: 110 mmol/L (ref 98–111)
Creatinine: 0.63 mg/dL (ref 0.44–1.00)
GFR, Estimated: >60 mL/min (ref >60)
Glucose, Bld: 132 mg/dL — ABNORMAL HIGH (ref 70–99)
Potassium: 3.8 mmol/L (ref 3.5–5.1)
Sodium: 141 mmol/L (ref 135–145)
Total Bilirubin: 0.5 mg/dL (ref 0.3–1.2)
Total Protein: 6.8 g/dL (ref 6.5–8.1)

## 2022-06-12 MED ORDER — SODIUM CHLORIDE 0.9 % IV SOLN
10.0000 mg | Freq: Once | INTRAVENOUS | Status: AC
  Administered 2022-06-12: 10 mg via INTRAVENOUS
  Filled 2022-06-12: qty 10

## 2022-06-12 MED ORDER — PALONOSETRON HCL INJECTION 0.25 MG/5ML
0.2500 mg | Freq: Once | INTRAVENOUS | Status: AC
  Administered 2022-06-12: 0.25 mg via INTRAVENOUS
  Filled 2022-06-12: qty 5

## 2022-06-12 MED ORDER — OXALIPLATIN CHEMO INJECTION 100 MG/20ML
130.0000 mg/m2 | Freq: Once | INTRAVENOUS | Status: AC
  Administered 2022-06-12: 275 mg via INTRAVENOUS
  Filled 2022-06-12: qty 55

## 2022-06-12 MED ORDER — DEXTROSE 5 % IV SOLN: Freq: Once | INTRAVENOUS | Status: AC

## 2022-06-12 NOTE — Progress Notes (Signed)
[facility]   [phone] [phone]) [phone]   Clinic Follow up Note   Patient Care Team: Kathyrn Drown, MD as PCP - General (Family Medicine) Terrance Mass, MD (Inactive) as Consulting Physician (Obstetrics and Gynecology) Jovita Gamma, MD as Consulting Physician (Neurosurgery) Rogene Houston, MD as Consulting Physician (Gastroenterology) Truitt Merle, MD as Consulting Physician (Oncology) Royston Bake, RN as Oncology Nurse Navigator (Oncology)  Date of Service:  06/12/2022  CHIEF COMPLAINT: f/u of ovarian adenocarcinoma  CURRENT THERAPY:  Adjuvant CAPEOX, q21d, starting 04/30/22             -Xeloda dose: 1542m AM, 20031mPM days 1-14  ASSESSMENT & PLAN:  Susan SHIFLETTs a [age].o. female with   1. Mucinous adenocarcinoma of left ovary, Stage IIB -presented with acute left abdominal pain. CT [facility] on 12/13/21 in ED showed a 6.6 cm hemorrhagic mass. Baseline CA 125 from 01/03/22 was WNL at 9.7. her pain resolved and no recurrent or other symptoms -S/p left salpingo-oophorectomy on 03/12/22 with Dr. TuBerline LopesPath revealed adenocarcinoma, IHC studies were most consistent with lower GI (colorectal) or appendiceal primary. I spoke with pathologist Dr. KaVic Ripperhe agrees with the diagnosis but pointed out that 2% ovarian cancer is intestinal type and IHC will be similar to low GI primary.  -colonoscopy on 03/28/22 with Dr. CiBryan Lemmahowed no suspicious mass, only polyps.  -baseline CEA on 04/01/22 was WNL.  -PET scan on 04/10/22 showed: no convincing evidence of hypermetabolic disease; postsurgical hypermetabolism in left adnexa and abdominal wall. -enterography on 04/15/22 showed: no small bowel masses or nodularity or tumor along omentum or mesentery; no definite appendiceal tumor. -she began adjuvant CAPEOX on 04/30/22.  She has tolerated moderately well overall. She has required longer flush due to side effects during infusion. -she reports her symptoms are  lasting longer with each cycle. Despite longer-lasting fatigue, she notes she is still able to do everything she needs and wants to do. We discussed the option of decreasing her Xeloda dose or postpone next cycle chemo for a week if she has more AEs with this cycle. She would like to keep it the same for now and see how she does with this cycle. -labs reviewed, overall stable, except slightly elevated liver enzymes. We will monitor. Adequate to proceed with cycle 3 today.   2. Chemo toxicities: Diarrhea, Taste and smell changes -between the diarrhea and difficulty taking in fluids due to cold sensitivity and taste changes, she feels dehydrated. We will give IVF in a week  3. Incidental findings on images  -She was found to have hypermetabolism in the right tonsil on PET scan. She was evaluated by Dr. SkFredric Dinen 05/22/22, and exam was not concerning. -Her CT scan from March 2023 showed a 1.5 cm round lesion in the right hepatic lobe. PET scan on 04/10/22 which showed no hypermetabolic activity in the liver.   4. Genetics  -she is scheduled to meet genetic counselor Cari on 07/01/22.     PLAN:  -proceed with C3 oxali today -continue Xeloda at same dose -lab and IVF in 1 week -genetics, lab, and f/u with me on 9/25 -C4 oxali on 9/26 as scheduled   No problem-specific Assessment & Plan notes found for this encounter.   SUMMARY OF ONCOLOGIC HISTORY: Oncology History  [place]) [place], [place] [place] [facility]   EXAM: CT ABDOMEN AND PELVIS WITH CONTRAST  IMPRESSION: 1. 6.6 cm hyperdense left adnexal/ovarian mass, likely  a hemorrhagic cyst/mass. Recommend follow-up pelvic ultrasound. 2. 1.5 cm hyperenhancing lesion in the right hepatic lobe. Most likely differentials include hemangioma, FNH, adenoma. Consider nonemergent follow-up MRI abdomen with contrast.   01/03/2022 Tumor Marker   CA 125: 9.7 (WNL)   01/14/2022 Imaging   EXAM: MRI ABDOMEN WITHOUT AND WITH  CONTRAST  IMPRESSION: Enhancing 13 mm segment VI hepatic lesion measuring 13 mm demonstrates nonspecific imaging characteristics but which is favored to reflect a benign etiology such as focal nodular hyperplasia or a hepatic adenoma. Recommend follow-up MRI in 6 months with and without EOVIST contrast for more definitive characterization and to assess stability.   03/12/2022 Pathology Results   FINAL MICROSCOPIC DIAGNOSIS:   A.   FALLOPIAN TUBE AND OVARY, LEFT, SALPINGO OOPHORECTOMY:  -    Adenocarcinoma, most consistent with metastatic colorectal  adenocarcinoma, see Comment.  -    Fallopian tube, negative for malignancy/intraepithelial carcinoma (STIC).   B.   PELVIC SIDEWALL, RIGHT, BIOPSY:  -    Benign, fibroelastic nodule.  -    Negative for malignancy.   C.   CUL DE SAC, POSTERIOR, BIOPSY:  -    Negative for malignancy.   D.   CUL DE SAC, ANTERIOR, BIOPSY:  -    Negative for malignancy.   E.   PELVIC SIDEWALL, LEFT, BIOPSY:  -    Negative for malignancy.   F.   PARACOLIC GUTTER, LEFT, BIOPSY:  -    Negative for malignancy.   G.   PARACOLIC GUTTER, RIGHT, BIOPSY:  -    Negative for malignancy.   H.   OMENTUM:  -    Negative for malignancy.   I.   OVARY REMNANT, LEFT, EXCISION:  -    Adenocarcinoma, most consistent with metastatic colorectal  adenocarcinoma.   COMMENT:  The tumor was interrogated with immunohistochemical (IHC) stains.  The tumor cells are diffusely and strongly positive for CDX-2, CK20 and mCEA and the tumor cells are CK7 negative.   This immunoprofile strongly favors metastatic colorectal adenocarcinoma.   The tumor cells are PAX8 negative to focally equivocal, a finding which is non-contributory. Primary ovarian mucinous adenocarcinoma can be CK20, CDX-2 and mCEA positive; although the staining pattern is typically focal and not diffuse, as in this case.  Also, supportive of a metastasis is CK7 negativity (primary ovarian mucinous carcinoma is  typically CK7 positive).   There are rare cells with intracytoplasmic mucin  (mucicarmine stain).   The best IHC stain to differentiate between metastatic colorectal  adenocarcinoma and primary ovarian mucinous adenocarcinoma is SATB2. This marker has been ordered and will be performed at [facility].  The result will be reported in an addendum.   Furthermore, the tumor cells are negative for serous markers (p16, p53 (wildtype), WT-1) and negative for endometrioid markers (ER and vimentin).  The Ki-67 mitotic index is high.  The IHC stains and mucicarmine stain have satisfactory controls.    ADDENDUM:  The tumor was interrogated with SATB2 immunohistochemical (IHC) stain, performed at [facility].  The tumor has diffuse strong nuclear reactivity.  This result, in combination with the prior IHC results, is essentially diagnostic of a lower GI (colorectal) or appendiceal primary tumor.  The control is satisfactory.    03/28/2022 Procedure   Colonoscopy, Dr. Bryan Lemma  Impression: - Two 2 to 3 mm polyps in the sigmoid colon, removed with a cold snare. Resected and retrieved. - The remainder of the colon was normal, including retoflexed views of the right colon. - The examined  portion of the ileum was normal. - Non-bleeding internal hemorrhoids.   04/01/2022 Initial Diagnosis   Adenocarcinoma (epithelial) of ovary, left ([place])   04/17/2022 Cancer Staging   Staging form: Ovary, Fallopian Tube, and Primary Peritoneal Carcinoma, AJCC 8th Edition - Clinical stage from 04/17/2022: FIGO Stage IIB (cT2b, cN0, cM0) - Signed by Truitt Merle, MD on 04/22/2022 Stage prefix: Initial diagnosis Histologic grade (G): GX Histologic grading system: 4 grade system   04/29/2022 -  Chemotherapy   Patient is on Treatment Plan : COLORECTAL Xelox ([place]apeox)(130/850) q21d     04/30/2022 - 05/20/2022 Chemotherapy   Patient is on Treatment Plan : COLORECTAL Xelox (Capeox) q21d        INTERVAL HISTORY:  ARIDAY BRINKER  is here for a follow up of ovarian cancer. She was last seen by NP Lacie on 05/20/22. She presents to the clinic accompanied by her husband. She reports she had diarrhea following last cycle. She explains she is having trouble taking in fluids due to not being able to drink cold and taste changes.   All other systems were reviewed with the patient and are negative.  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   BMI 35.0-35.9,adult    Cancer ([place]) 03/12/2022   colon adenoca on ovary   Gall stone    GERD (gastroesophageal reflux disease)    Normal spontaneous vaginal delivery    2   Pneumonia     SURGICAL HISTORY: Past Surgical History:  Procedure Laterality Date   BREAST BIOPSY Left 07/15/2017   2 masses   BUNIONECTOMY  08/2021   COLONOSCOPY N/A 06/09/2014   Procedure: COLONOSCOPY;  Surgeon: Rogene Houston, MD;  Location: [facility];  Service: Endoscopy;  Laterality: N/A;  830-moved to 945 Ann to notify pt   COLONOSCOPY  03/28/2022   HYSTEROTOMY     LAPAROSCOPIC TOTAL HYSTERECTOMY  07/18/2010   RSO   NECK SURGERY  11/2012   c6 c7   ROBOTIC ASSISTED SALPINGO OOPHERECTOMY Left 03/12/2022   Procedure: XI ROBOTIC ASSISTED LEFT SALPINGO OOPHORECTOMY WITH STAGING;  Surgeon: Lafonda Mosses, MD;  Location: [facility];  Service: Gynecology;  Laterality: Left;    I have reviewed the social history and family history with the patient and they are unchanged from previous note.  ALLERGIES:  is allergic to wound dressing adhesive.  MEDICATIONS:  Current Outpatient Medications  Medication Sig Dispense Refill   BIOTIN PO Take 4 tablets by mouth daily.     capecitabine (XELODA) 500 MG tablet Take 3 tablets (1500 mg total) by mouth in morning and 4 tablets (2000 mg total) by mouth in evening every 12 hours. Take for 14 days then off for 7 days. Take within 30 minutes  after meals. 98 tablet 1   Cholecalciferol (VITAMIN D3 PO) Take 1 tablet by mouth daily.     famotidine (PEPCID) 20 MG tablet Take 1 tablet (20 mg total) by mouth 2 (two) times daily. 60 tablet 5   ibuprofen (ADVIL) 800 MG tablet Take 1 tablet (800 mg total) by mouth every 8 (eight) hours as needed for moderate pain. For AFTER surgery only 30 tablet 0   loratadine (CLARITIN) 10 MG tablet Take 10 mg by mouth daily.     rosuvastatin (CRESTOR) 5 MG tablet TAKE ONE TABLET BY MOUTH ON MONDAYS, WEDNESDAYS, AND FRIDAYS FOR CHOLESTEROL 36 tablet 1  senna-docusate (SENOKOT-S) 8.6-50 MG tablet Take 2 tablets by mouth at bedtime. For AFTER surgery, do not take if having diarrhea 30 tablet 0   venlafaxine XR (EFFEXOR-XR) 75 MG 24 hr capsule Take 1 capsule (75 mg total) by mouth daily with breakfast. 30 capsule 1   vitamin C (ASCORBIC ACID) 500 MG tablet Take 1,000 mg by mouth at bedtime.     No current facility-administered medications for this visit.    PHYSICAL EXAMINATION: ECOG PERFORMANCE STATUS: 1 - Symptomatic but completely ambulatory  Vitals:   06/12/22 0913  BP: (!) 144/88  Pulse: 86  Resp: 18  Temp: 98.2 F (36.8 C)  SpO2: 96%   Wt Readings from Last 3 Encounters:  06/12/22 210 lb 12.8 oz (95.6 kg)  05/20/22 213 lb 3.2 oz (96.7 kg)  05/07/22 209 lb 1.6 oz (94.8 kg)     GENERAL:alert, no distress and comfortable SKIN: skin color normal, no rashes or significant lesions EYES: normal, Conjunctiva are pink and non-injected, sclera clear  NEURO: alert & oriented x 3 with fluent speech  LABORATORY DATA:  I have reviewed the data as listed    Latest Ref Rng & Units 06/12/2022    8:43 AM 05/20/2022    9:29 AM 05/07/2022    1:59 PM  CBC  WBC 4.0 - 10.5 K/uL 4.3  5.5  6.5   Hemoglobin 12.0 - 15.0 g/dL 13.5  14.4  13.7   Hematocrit 36.0 - 46.0 % 38.5  40.9  40.0   Platelets 150 - 400 K/uL 186  284  291         Latest Ref Rng & Units 06/12/2022    8:43 AM 05/20/2022    9:29 AM  05/07/2022    1:59 PM  CMP  Glucose 70 - 99 mg/dL 132  125  96   BUN 6 - 20 mg/dL '11  12  14   ' Creatinine 0.44 - 1.00 mg/dL 0.63  0.66  0.65   Sodium 135 - 145 mmol/L 141  139  141   Potassium 3.5 - 5.1 mmol/L 3.8  3.6  4.0   Chloride 98 - 111 mmol/L 110  108  110   CO2 22 - 32 mmol/L '26  27  27   ' Calcium 8.9 - 10.3 mg/dL 9.6  9.1  9.5   Total Protein 6.5 - 8.1 g/dL 6.8  6.9  6.9   Total Bilirubin 0.3 - 1.2 mg/dL 0.5  0.5  0.5   Alkaline Phos 38 - 126 U/L 79  65  48   AST 15 - 41 U/L 77  41  26   ALT 0 - 44 U/L 129  60  44       RADIOGRAPHIC STUDIES: I have personally reviewed the radiological images as listed and agreed with the findings in the report. No results found.    No orders of the defined types were placed in this encounter.  All questions were answered. The patient knows to call the clinic with any problems, questions or concerns. No barriers to learning was detected. The total time spent in the appointment was 30 minutes.     Truitt Merle, MD 06/12/2022   I, Wilburn Mylar, am acting as scribe for Truitt Merle, MD.   I have reviewed the above documentation for accuracy and completeness, and I agree with the above.

## 2022-06-12 NOTE — Patient Instructions (Signed)
[facility] MEDICAL [facility]COLOGY  Discharge Instructions: Thank you for choosing [facility] to provide your oncology and hematology care.   If you have a lab appointment with the Cancer Center, please go directly to the Cancer Center and check in at the registration area.   Wear comfortable clothing and clothing appropriate for easy access to any Portacath or PICC line.   We strive to give you quality time with your provider. You may need to reschedule your appointment if you arrive late (15 or more minutes).  Arriving late affects you and other patients whose appointments are after yours.  Also, if you miss three or more appointments without notifying the office, you may be dismissed from the clinic at the provider's discretion.      For prescription refill requests, have your pharmacy contact our office and allow 72 hours for refills to be completed.    Today you received the following chemotherapy and/or immunotherapy agents: Oxaliplatin      To help prevent nausea and vomiting after your treatment, we encourage you to take your nausea medication as directed.  BELOW ARE SYMPTOMS THAT SHOULD BE REPORTED IMMEDIATELY: *FEVER GREATER THAN 100.4 F (38 C) OR HIGHER *CHILLS OR SWEATING *NAUSEA AND VOMITING THAT IS NOT CONTROLLED WITH YOUR NAUSEA MEDICATION *UNUSUAL SHORTNESS OF BREATH *UNUSUAL BRUISING OR BLEEDING *URINARY PROBLEMS (pain or burning when urinating, or frequent urination) *BOWEL PROBLEMS (unusual diarrhea, constipation, pain near the anus) TENDERNESS IN MOUTH AND THROAT WITH OR WITHOUT PRESENCE OF ULCERS (sore throat, sores in mouth, or a toothache) UNUSUAL RASH, SWELLING OR PAIN  UNUSUAL VAGINAL DISCHARGE OR ITCHING   Items with * indicate a potential emergency and should be followed up as soon as possible or go to the Emergency Department if any problems should occur.  Please show the CHEMOTHERAPY ALERT CARD or IMMUNOTHERAPY ALERT CARD at check-in  to the Emergency Department and triage nurse.  Should you have questions after your visit or need to cancel or reschedule your appointment, please contact [facility]  Dept: [phone]  and follow the prompts.  Office hours are 8:00 a.m. to 4:30 p.m. Monday - Friday. Please note that voicemails left after 4:00 p.m. may not be returned until the following business day.  We are closed weekends and major holidays. You have access to a nurse at all times for urgent questions. Please call the main number to the clinic Dept: [phone] and follow the prompts.   For any non-urgent questions, you may also contact your provider using MyChart. We now offer e-Visits for anyone 18 and older to request care online for non-urgent symptoms. For details visit [email or web address].   Also download the MyChart app! Go to the app store, search "MyChart", open the app, select [facility], and log in with your MyChart username and password.  Masks are optional in the cancer centers. If you would like for your care team to wear a mask while they are taking care of you, please let them know. You may have one support person who is at least 59 years old accompany you for your appointments. 

## 2022-06-12 NOTE — Progress Notes (Signed)
Per Burr Medico MD, ok to treat today with AST 77 and ALT 129.

## 2022-06-14 ENCOUNTER — Telehealth: Payer: Self-pay | Admitting: Hematology

## 2022-06-14 NOTE — Telephone Encounter (Signed)
Scheduled follow-up appointment per 9/6 los. Patient is aware. 

## 2022-06-16 ENCOUNTER — Encounter: Payer: Self-pay | Admitting: Hematology

## 2022-06-17 ENCOUNTER — Telehealth: Payer: Self-pay

## 2022-06-17 NOTE — Telephone Encounter (Signed)
Called the patient regarding her not feeling well, and experiencing diarrhea due to her medication Xeloda. Per Dr.Feng, if the diarrhea has resolved, is eating well and feels better then it's okay to restart Xeloda today. If not, she can wait til Wednesday to restart. The patient verbalized understanding and is going to wait till Wednesday to restart medication.

## 2022-06-19 ENCOUNTER — Inpatient Hospital Stay: Payer: BC Managed Care – PPO

## 2022-06-19 ENCOUNTER — Other Ambulatory Visit: Payer: Self-pay

## 2022-06-19 ENCOUNTER — Other Ambulatory Visit: Payer: Self-pay | Admitting: Hematology

## 2022-06-19 VITALS — BP 141/89 | HR 86 | Temp 98.3°F | Resp 16

## 2022-06-19 DIAGNOSIS — C562 Malignant neoplasm of left ovary: Secondary | ICD-10-CM | POA: Diagnosis not present

## 2022-06-19 LAB — CBC WITH DIFFERENTIAL (CANCER CENTER ONLY)
Abs Immature Granulocytes: 0.04 K/uL (ref 0.00–0.07)
Basophils Absolute: 0 K/uL (ref 0.0–0.1)
Basophils Relative: 0 %
Eosinophils Absolute: 0.3 K/uL (ref 0.0–0.5)
Eosinophils Relative: 5 %
HCT: 42.2 % (ref 36.0–46.0)
Hemoglobin: 14.7 g/dL (ref 12.0–15.0)
Immature Granulocytes: 1 %
Lymphocytes Relative: 40 %
Lymphs Abs: 2.8 K/uL (ref 0.7–4.0)
MCH: 30.9 pg (ref 26.0–34.0)
MCHC: 34.8 g/dL (ref 30.0–36.0)
MCV: 88.7 fL (ref 80.0–100.0)
Monocytes Absolute: 0.9 K/uL (ref 0.1–1.0)
Monocytes Relative: 14 %
Neutro Abs: 2.7 K/uL (ref 1.7–7.7)
Neutrophils Relative %: 40 %
Platelet Count: 126 K/uL — ABNORMAL LOW (ref 150–400)
RBC: 4.76 MIL/uL (ref 3.87–5.11)
RDW: 17 % — ABNORMAL HIGH (ref 11.5–15.5)
WBC Count: 6.9 K/uL (ref 4.0–10.5)
nRBC: 0 % (ref 0.0–0.2)

## 2022-06-19 LAB — CMP (CANCER CENTER ONLY)
ALT: 117 U/L — ABNORMAL HIGH (ref 0–44)
AST: 81 U/L — ABNORMAL HIGH (ref 15–41)
Albumin: 4.3 g/dL (ref 3.5–5.0)
Alkaline Phosphatase: 86 U/L (ref 38–126)
Anion gap: 5 (ref 5–15)
BUN: 14 mg/dL (ref 6–20)
CO2: 28 mmol/L (ref 22–32)
Calcium: 9.8 mg/dL (ref 8.9–10.3)
Chloride: 110 mmol/L (ref 98–111)
Creatinine: 0.71 mg/dL (ref 0.44–1.00)
GFR, Estimated: >60 mL/min
Glucose, Bld: 126 mg/dL — ABNORMAL HIGH (ref 70–99)
Potassium: 3.9 mmol/L (ref 3.5–5.1)
Sodium: 143 mmol/L (ref 135–145)
Total Bilirubin: 0.6 mg/dL (ref 0.3–1.2)
Total Protein: 7.1 g/dL (ref 6.5–8.1)

## 2022-06-19 MED ORDER — ONDANSETRON HCL 4 MG/2ML IJ SOLN
8.0000 mg | Freq: Once | INTRAMUSCULAR | Status: AC
  Administered 2022-06-19: 8 mg via INTRAVENOUS
  Filled 2022-06-19: qty 4

## 2022-06-19 MED ORDER — SODIUM CHLORIDE 0.9 % IV SOLN: Freq: Once | INTRAVENOUS | Status: AC

## 2022-06-19 MED ORDER — DEXAMETHASONE SODIUM PHOSPHATE 10 MG/ML IJ SOLN
10.0000 mg | Freq: Once | INTRAMUSCULAR | Status: AC
  Administered 2022-06-19: 10 mg via INTRAVENOUS
  Filled 2022-06-19: qty 1

## 2022-06-19 MED ORDER — SODIUM CHLORIDE 0.9 % IV SOLN
Freq: Once | INTRAVENOUS | Status: DC
  Filled 2022-06-19: qty 4

## 2022-06-19 NOTE — Patient Instructions (Signed)
Rehydration, Adult Rehydration is the replacement of body fluids, salts, and minerals (electrolytes) that are lost during dehydration. Dehydration is when there is not enough water or other fluids in the body. This happens when you lose more fluids than you take in. Common causes of dehydration include: Not drinking enough fluids. This can occur when you are ill or doing activities that require a lot of energy, especially in hot weather. Conditions that cause loss of water or other fluids, such as diarrhea, vomiting, sweating, or urinating a lot. Other illnesses, such as fever or infection. Certain medicines, such as those that remove excess fluid from the body (diuretics). Symptoms of mild or moderate dehydration may include thirst, dry lips and mouth, and dizziness. Symptoms of severe dehydration may include increased heart rate, confusion, fainting, and not urinating. For severe dehydration, you may need to get fluids through an IV at the hospital. For mild or moderate dehydration, you can usually rehydrate at home by drinking certain fluids as told by your health care provider. What are the risks? Generally, rehydration is safe. However, taking in too much fluid (overhydration) can be a problem. This is rare. Overhydration can cause an electrolyte imbalance, kidney failure, or a decrease in salt (sodium) levels in the body. Supplies needed You will need an oral rehydration solution (ORS) if your health care provider tells you to use one. This is a drink to treat dehydration. It can be found in pharmacies and retail stores. How to rehydrate Fluids Follow instructions from your health care provider for rehydration. The kind of fluid and the amount you should drink depend on your condition. In general, you should choose drinks that you prefer. If told by your health care provider, drink an ORS. Make an ORS by following instructions on the package. Start by drinking small amounts, about  cup (120  mL) every 5-10 minutes. Slowly increase how much you drink until you have taken the amount recommended by your health care provider. Drink enough clear fluids to keep your urine pale yellow. If you were told to drink an ORS, finish it first, then start slowly drinking other clear fluids. Drink fluids such as: Water. This includes sparkling water and flavored water. Drinking only water can lead to having too little sodium in your body (hyponatremia). Follow the advice of your health care provider. Water from ice chips you suck on. Fruit juice with water you add to it (diluted). Sports drinks. Hot or cold herbal teas. Broth-based soups. Milk or milk products. Food Follow instructions from your health care provider about what to eat while you rehydrate. Your health care provider may recommend that you slowly begin eating regular foods in small amounts. Eat foods that contain a healthy balance of electrolytes, such as bananas, oranges, potatoes, tomatoes, and spinach. Avoid foods that are greasy or contain a lot of sugar. In some cases, you may get nutrition through a feeding tube that is passed through your nose and into your stomach (nasogastric tube, or NG tube). This may be done if you have uncontrolled vomiting or diarrhea. Beverages to avoid  Certain beverages may make dehydration worse. While you rehydrate, avoid drinking alcohol. How to tell if you are recovering from dehydration You may be recovering from dehydration if: You are urinating more often than before you started rehydrating. Your urine is pale yellow. Your energy level improves. You vomit less frequently. You have diarrhea less frequently. Your appetite improves or returns to normal. You feel less dizzy or less light-headed.   Your skin tone and color start to look more normal. Follow these instructions at home: Take over-the-counter and prescription medicines only as told by your health care provider. Do not take sodium  tablets. Doing this can lead to having too much sodium in your body (hypernatremia). Contact a health care provider if: You continue to have symptoms of mild or moderate dehydration, such as: Thirst. Dry lips. Slightly dry mouth. Dizziness. Dark urine or less urine than normal. Muscle cramps. You continue to vomit or have diarrhea. Get help right away if you: Have symptoms of dehydration that get worse. Have a fever. Have a severe headache. Have been vomiting and the following happens: Your vomiting gets worse or does not go away. Your vomit includes blood or green matter (bile). You cannot eat or drink without vomiting. Have problems with urination or bowel movements, such as: Diarrhea that gets worse or does not go away. Blood in your stool (feces). This may cause stool to look black and tarry. Not urinating, or urinating only a small amount of very dark urine, within 6-8 hours. Have trouble breathing. Have symptoms that get worse with treatment. These symptoms may represent a serious problem that is an emergency. Do not wait to see if the symptoms will go away. Get medical help right away. Call your local emergency services (911 in the [place] Do not drive yourself to the hospital. Summary Rehydration is the replacement of body fluids and minerals (electrolytes) that are lost during dehydration. Follow instructions from your health care provider for rehydration. The kind of fluid and amount you should drink depend on your condition. Slowly increase how much you drink until you have taken the amount recommended by your health care provider. Contact your health care provider if you continue to show signs of mild or moderate dehydration. This information is not intended to replace advice given to you by your health care provider. Make sure you discuss any questions you have with your health care provider. Document Revised: 11/24/2019 Document Reviewed: 10/04/2019 Elsevier Patient  Education  [place]. Nausea, Adult Nausea is feeling like you may vomit. Feeling like you may vomit is usually not serious, but it may be an early sign of a more serious medical problem. Vomiting is when stomach contents forcefully come out of your mouth. If you vomit, or if you are not able to drink enough fluids, you may not have enough water in your body (get dehydrated). If you do not have enough water in your body, you may: Feel tired. Feel thirsty. Have a dry mouth. Have cracked lips. Pee (urinate) less often. Older adults and people who have other diseases or a weak body defense system (immune system) have a higher risk of not having enough water in the body. The main goals of treating this condition are: To relieve your nausea. To ensure your nausea occurs less often. To prevent vomiting and losing too much fluid. Follow these instructions at home: Watch your symptoms for any changes. Tell your doctor about them. Eating and drinking     Take an ORS (oral rehydration solution). This is a drink that is sold at pharmacies and stores. Drink clear fluids in small amounts as you are able. These include: Water. Ice chips. Fruit juice that has water added (diluted fruit juice). Low-calorie sports drinks. Eat bland, easy-to-digest foods in small amounts as you are able, such as: Bananas. Applesauce. Rice. Low-fat (lean) meats. Toast. Crackers. Avoid drinking fluids that have a lot of sugar or  caffeine in them. This includes energy drinks, sports drinks, and soda. Avoid alcohol. Avoid spicy or fatty foods. General instructions Take over-the-counter and prescription medicines only as told by your doctor. Rest at home while you get better. Drink enough fluid to keep your pee (urine) pale yellow. Take slow and deep breaths when you feel like you may vomit. Avoid food or things that have strong smells. Wash your hands often with soap and water for at least 20 seconds. If  you cannot use soap and water, use hand sanitizer. Make sure that everyone in your home washes their hands well and often. Keep all follow-up visits. Contact a doctor if: You feel worse. You feel like you may vomit and this lasts for more than 2 days. You vomit. You are not able to drink fluids without vomiting. You have new symptoms. You have a fever. You have a headache. You have muscle cramps. You have a rash. You have pain while peeing. You feel light-headed or dizzy. Get help right away if: You have pain in your chest, neck, arm, or jaw. You feel very weak or you faint. You have vomit that is bright red or looks like coffee grounds. You have bloody or black poop (stools) or poop that looks like tar. You have a very bad headache, a stiff neck, or both. You have very bad pain, cramping, or bloating in your belly (abdomen). You have trouble breathing or you are breathing very quickly. Your heart is beating very quickly. Your skin feels cold and clammy. You feel confused. You have signs of losing too much water in your body, such as: Dark pee, very little pee, or no pee. Cracked lips. Dry mouth. Sunken eyes. Sleepiness. Weakness. These symptoms may be an emergency. Get help right away. Call 911. Do not wait to see if the symptoms will go away. Do not drive yourself to the hospital. Summary Nausea is feeling like you are about vomit. If you vomit, or if you are not able to drink enough fluids, you may not have enough water in your body (get dehydrated). Eat and drink what your doctor tells you. Take over-the-counter and prescription medicines only as told by your doctor. Contact a doctor right away if your symptoms get worse or you have new symptoms. Keep all follow-up visits. This information is not intended to replace advice given to you by your health care provider. Make sure you discuss any questions you have with your health care provider. Document Revised: 03/30/2021  Document Reviewed: 03/30/2021 Elsevier Patient Education  [place].

## 2022-06-21 ENCOUNTER — Encounter: Payer: Self-pay | Admitting: Hematology

## 2022-06-27 ENCOUNTER — Other Ambulatory Visit: Payer: Self-pay

## 2022-06-27 ENCOUNTER — Other Ambulatory Visit (HOSPITAL_COMMUNITY): Payer: Self-pay

## 2022-07-01 ENCOUNTER — Inpatient Hospital Stay: Payer: BC Managed Care – PPO

## 2022-07-01 ENCOUNTER — Encounter: Payer: Self-pay | Admitting: Hematology

## 2022-07-01 ENCOUNTER — Other Ambulatory Visit: Payer: Self-pay | Admitting: Genetic Counselor

## 2022-07-01 ENCOUNTER — Other Ambulatory Visit: Payer: Self-pay

## 2022-07-01 ENCOUNTER — Other Ambulatory Visit (HOSPITAL_COMMUNITY): Payer: Self-pay

## 2022-07-01 ENCOUNTER — Inpatient Hospital Stay (HOSPITAL_BASED_OUTPATIENT_CLINIC_OR_DEPARTMENT_OTHER): Payer: BC Managed Care – PPO | Admitting: Hematology

## 2022-07-01 ENCOUNTER — Other Ambulatory Visit: Payer: Self-pay | Admitting: Emergency Medicine

## 2022-07-01 ENCOUNTER — Inpatient Hospital Stay (HOSPITAL_BASED_OUTPATIENT_CLINIC_OR_DEPARTMENT_OTHER): Payer: BC Managed Care – PPO | Admitting: Genetic Counselor

## 2022-07-01 VITALS — BP 157/84 | HR 86 | Temp 98.5°F | Resp 18 | Ht 65.0 in | Wt 211.8 lb

## 2022-07-01 DIAGNOSIS — C562 Malignant neoplasm of left ovary: Secondary | ICD-10-CM

## 2022-07-01 DIAGNOSIS — Z8042 Family history of malignant neoplasm of prostate: Secondary | ICD-10-CM

## 2022-07-01 DIAGNOSIS — Z803 Family history of malignant neoplasm of breast: Secondary | ICD-10-CM | POA: Diagnosis not present

## 2022-07-01 LAB — CMP (CANCER CENTER ONLY)
ALT: 191 U/L — ABNORMAL HIGH (ref 0–44)
AST: 149 U/L — ABNORMAL HIGH (ref 15–41)
Albumin: 4 g/dL (ref 3.5–5.0)
Alkaline Phosphatase: 104 U/L (ref 38–126)
Anion gap: 2 — ABNORMAL LOW (ref 5–15)
BUN: 8 mg/dL (ref 6–20)
CO2: 32 mmol/L (ref 22–32)
Calcium: 9.2 mg/dL (ref 8.9–10.3)
Chloride: 108 mmol/L (ref 98–111)
Creatinine: 0.7 mg/dL (ref 0.44–1.00)
GFR, Estimated: >60 mL/min (ref >60)
Glucose, Bld: 112 mg/dL — ABNORMAL HIGH (ref 70–99)
Potassium: 3.8 mmol/L (ref 3.5–5.1)
Sodium: 142 mmol/L (ref 135–145)
Total Bilirubin: 0.7 mg/dL (ref 0.3–1.2)
Total Protein: 7 g/dL (ref 6.5–8.1)

## 2022-07-01 LAB — CBC WITH DIFFERENTIAL (CANCER CENTER ONLY)
Abs Immature Granulocytes: 0.01 10*3/uL (ref 0.00–0.07)
Basophils Absolute: 0 10*3/uL (ref 0.0–0.1)
Basophils Relative: 0 %
Eosinophils Absolute: 0.2 10*3/uL (ref 0.0–0.5)
Eosinophils Relative: 5 %
HCT: 40.1 % (ref 36.0–46.0)
Hemoglobin: 13.7 g/dL (ref 12.0–15.0)
Immature Granulocytes: 0 %
Lymphocytes Relative: 38 %
Lymphs Abs: 1.8 10*3/uL (ref 0.7–4.0)
MCH: 30.9 pg (ref 26.0–34.0)
MCHC: 34.2 g/dL (ref 30.0–36.0)
MCV: 90.5 fL (ref 80.0–100.0)
Monocytes Absolute: 0.6 10*3/uL (ref 0.1–1.0)
Monocytes Relative: 12 %
Neutro Abs: 2 10*3/uL (ref 1.7–7.7)
Neutrophils Relative %: 45 %
Platelet Count: 183 10*3/uL (ref 150–400)
RBC: 4.43 MIL/uL (ref 3.87–5.11)
RDW: 19.6 % — ABNORMAL HIGH (ref 11.5–15.5)
WBC Count: 4.6 10*3/uL (ref 4.0–10.5)
nRBC: 0 % (ref 0.0–0.2)

## 2022-07-01 LAB — GENETIC SCREENING ORDER

## 2022-07-01 MED FILL — Dexamethasone Sodium Phosphate Inj 100 MG/10ML: INTRAMUSCULAR | Qty: 1 | Status: AC

## 2022-07-01 NOTE — Progress Notes (Signed)
[place]   Telephone:(336) [phone] Fax:(336) [phone]   Clinic Follow up Note   Patient Care Team: Kathyrn Drown, MD as PCP - General (Family Medicine) Terrance Mass, MD (Inactive) as Consulting Physician (Obstetrics and Gynecology) Jovita Gamma, MD as Consulting Physician (Neurosurgery) Rogene Houston, MD as Consulting Physician (Gastroenterology) Truitt Merle, MD as Consulting Physician (Oncology)  Date of Service:  07/01/2022  CHIEF COMPLAINT: f/u of ovarian adenocarcinoma  CURRENT THERAPY:  Adjuvant CAPEOX, q21d, starting 04/30/22             -Xeloda dose: 1584m AM, 2002mPM days 1-14  ASSESSMENT & PLAN:  [name] a [age].o. female with   1. Mucinous adenocarcinoma of left ovary, Stage IIB -presented with acute left abdominal pain. CT [facility] on 12/13/21 in ED showed a 6.6 cm hemorrhagic mass. Baseline CA 125 from 01/03/22 was WNL at 9.7. her pain resolved and no recurrent or other symptoms -S/p left salpingo-oophorectomy on 03/12/22 with Dr. TuBerline LopesPath revealed adenocarcinoma, IHC studies were most consistent with lower GI (colorectal) or appendiceal primary. I spoke with pathologist Dr. KaVic Ripperhe agrees with the diagnosis but pointed out that 2% ovarian cancer is intestinal type and IHC will be similar to low GI primary.  -colonoscopy on 03/28/22 with Dr. CiBryan Lemmahowed no suspicious mass, only polyps.  -baseline CEA on 04/01/22 was WNL.  -PET scan on 04/10/22 showed: no convincing evidence of hypermetabolic disease; postsurgical hypermetabolism in left adnexa and abdominal wall. -enterography on 04/15/22 showed: no small bowel masses or nodularity or tumor along omentum or mesentery; no definite appendiceal tumor. -she began adjuvant CAPEOX on 04/30/22.  She has tolerated moderately well overall. She has required longer flush due to side effects during infusion. -she reports her cold sensitivity symptoms are lasting longer with each cycle. The cold  sensitivity is the most bothersome to her as it causes difficulty taking in fluids (she doesn't like warm drinks). I explained to her that oxaliplatin is what is causing the cold sensitivity. We again discussed the option of delaying treatment by a week. She insists on proceeding with treatment tomorrow. I will reduce her dose instead to hopefully improve her symptoms. -She only took 1 week of Xeloda with last cycle of chemo.  She plans to do the same with this cycle.  She is willing to add 1 more cycle chemo with Xeloda only after she completes cycle 4 Capox   2. Chemo toxicities: Diarrhea, Taste and smell changes, Cold sensitivity -she reports the cold sensitivity now lasts up to 14 days. -she reports difficulty taking in fluids due to taste change and cold sensitivity   3. Genetics  -she met genetic counselor Cari today, lab results are pending.     PLAN:  -proceed with C4 oxali tomorrow at reduced dose -continue Xeloda at same dose, she may only able to take for first week due to the cold sensitivity and difficulty with liquid.  -IVF on 9/29 and 10/4, she will call me if she needs more  -lab and f/u in 3 weeks, plan to add cycle 5 chemo with Xeloda only after next visit due to her missing doses from cycle 3 and 4    No problem-specific Assessment & Plan notes found for this encounter.   SUMMARY OF ONCOLOGIC HISTORY: Oncology History  Adenocarcinoma (epithelial) of ovary, left [facility]   EXAM: CT ABDOMEN AND PELVIS WITH CONTRAST  IMPRESSION: 1. 6.6 cm hyperdense left adnexal/ovarian mass,  likely a hemorrhagic cyst/mass. Recommend follow-up pelvic ultrasound. 2. 1.5 cm hyperenhancing lesion in the right hepatic lobe. Most likely differentials include hemangioma, FNH, adenoma. Consider nonemergent follow-up MRI abdomen with contrast.   01/03/2022 Tumor Marker   CA 125: 9.7 (WNL)   01/14/2022 Imaging   EXAM: MRI ABDOMEN WITHOUT AND WITH  CONTRAST  IMPRESSION: Enhancing 13 mm segment VI hepatic lesion measuring 13 mm demonstrates nonspecific imaging characteristics but which is favored to reflect a benign etiology such as focal nodular hyperplasia or a hepatic adenoma. Recommend follow-up MRI in 6 months with and without EOVIST contrast for more definitive characterization and to assess stability.   03/12/2022 Pathology Results   FINAL MICROSCOPIC DIAGNOSIS:   A.   FALLOPIAN TUBE AND OVARY, LEFT, SALPINGO OOPHORECTOMY:  -    Adenocarcinoma, most consistent with metastatic colorectal  adenocarcinoma, see Comment.  -    Fallopian tube, negative for malignancy/intraepithelial carcinoma (STIC).   B.   PELVIC SIDEWALL, RIGHT, BIOPSY:  -    Benign, fibroelastic nodule.  -    Negative for malignancy.   C.   CUL DE SAC, POSTERIOR, BIOPSY:  -    Negative for malignancy.   D.   CUL DE SAC, ANTERIOR, BIOPSY:  -    Negative for malignancy.   E.   PELVIC SIDEWALL, LEFT, BIOPSY:  -    Negative for malignancy.   F.   PARACOLIC GUTTER, LEFT, BIOPSY:  -    Negative for malignancy.   G.   PARACOLIC GUTTER, RIGHT, BIOPSY:  -    Negative for malignancy.   H.   OMENTUM:  -    Negative for malignancy.   I.   OVARY REMNANT, LEFT, EXCISION:  -    Adenocarcinoma, most consistent with metastatic colorectal  adenocarcinoma.   COMMENT:  The tumor was interrogated with immunohistochemical (IHC) stains.  The tumor cells are diffusely and strongly positive for CDX-2, CK20 and mCEA and the tumor cells are CK7 negative.   This immunoprofile strongly favors metastatic colorectal adenocarcinoma.   The tumor cells are PAX8 negative to focally equivocal, a finding which is non-contributory. Primary ovarian mucinous adenocarcinoma can be CK20, CDX-2 and mCEA positive; although the staining pattern is typically focal and not diffuse, as in this case.  Also, supportive of a metastasis is CK7 negativity (primary ovarian mucinous carcinoma is  typically CK7 positive).   There are rare cells with intracytoplasmic mucin  (mucicarmine stain).   The best IHC stain to differentiate between metastatic colorectal  adenocarcinoma and primary ovarian mucinous adenocarcinoma is SATB2. This marker has been ordered and will be performed at [facility].  The result will be reported in an addendum.   Furthermore, the tumor cells are negative for serous markers (p16, p53 (wildtype), WT-1) and negative for endometrioid markers (ER and vimentin).  The Ki-67 mitotic index is high.  The IHC stains and mucicarmine stain have satisfactory controls.    ADDENDUM:  The tumor was interrogated with SATB2 immunohistochemical (IHC) stain, performed at [facility].  The tumor has diffuse strong nuclear reactivity.  This result, in combination with the prior IHC results, is essentially diagnostic of a lower GI (colorectal) or appendiceal primary tumor.  The control is satisfactory.    03/28/2022 Procedure   Colonoscopy, Dr. Bryan Lemma  Impression: - Two 2 to 3 mm polyps in the sigmoid colon, removed with a cold snare. Resected and retrieved. - The remainder of the colon was normal, including retoflexed views of the right colon. - The  examined portion of the ileum was normal. - Non-bleeding internal hemorrhoids.   04/01/2022 Initial Diagnosis   Adenocarcinoma (epithelial) of ovary, left ([facility])   04/17/2022 Cancer Staging   Staging form: Ovary, Fallopian Tube, and Primary Peritoneal Carcinoma, AJCC 8th Edition - Clinical stage from 04/17/2022: FIGO Stage IIB (cT2b, cN0, cM0) - Signed by Truitt Merle, MD on 04/22/2022 Stage prefix: Initial diagnosis Histologic grade (G): GX Histologic grading system: 4 grade system   04/29/2022 -  Chemotherapy   Patient is on Treatment Plan : COLORECTAL Xelox (Capeox)(130/850) q21d     04/30/2022 - 05/20/2022 Chemotherapy   Patient is on Treatment Plan : COLORECTAL Xelox (Capeox) q21d        INTERVAL HISTORY:  [name]  is here for a follow up of ovarian adenocarcinoma. She was last seen by me on 06/12/22. She presents to the clinic accompanied by her husband. She reports last cycle was tough for her, mainly due to cold sensitivity and taste changes.   All other systems were reviewed with the patient and are negative.  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   BMI 35.0-35.9,adult    Cancer ([place]) 03/12/2022   colon adenoca on ovary   Gall stone    GERD (gastroesophageal reflux disease)    Normal spontaneous vaginal delivery    2   Pneumonia     SURGICAL HISTORY: Past Surgical History:  Procedure Laterality Date   BREAST BIOPSY Left 07/15/2017   2 masses   BUNIONECTOMY  08/2021   COLONOSCOPY N/A 06/09/2014   Procedure: COLONOSCOPY;  Surgeon: Rogene Houston, MD;  Location: [facility];  Service: Endoscopy;  Laterality: N/A;  830-moved to 945 Ann to notify pt   COLONOSCOPY  03/28/2022   HYSTEROTOMY     LAPAROSCOPIC TOTAL HYSTERECTOMY  07/18/2010   RSO   NECK SURGERY  11/2012   c6 c7   ROBOTIC ASSISTED SALPINGO OOPHERECTOMY Left 03/12/2022   Procedure: XI ROBOTIC ASSISTED LEFT SALPINGO OOPHORECTOMY WITH STAGING;  Surgeon: Lafonda Mosses, MD;  Location: [facility];  Service: Gynecology;  Laterality: Left;    I have reviewed the social history and family history with the patient and they are unchanged from previous note.  ALLERGIES:  is allergic to wound dressing adhesive.  MEDICATIONS:  Current Outpatient Medications  Medication Sig Dispense Refill   BIOTIN PO Take 4 tablets by mouth daily.     capecitabine (XELODA) 500 MG tablet Take 3 tablets (1500 mg total) by mouth in morning and 4 tablets (2000 mg total) by mouth in evening every 12 hours. Take for 14 days then off for 7 days. Take within 30 minutes after meals. 98 tablet 1   Cholecalciferol (VITAMIN D3 PO) Take 1  tablet by mouth daily.     famotidine (PEPCID) 20 MG tablet Take 1 tablet (20 mg total) by mouth 2 (two) times daily. 60 tablet 5   ibuprofen (ADVIL) 800 MG tablet Take 1 tablet (800 mg total) by mouth every 8 (eight) hours as needed for moderate pain. For AFTER surgery only 30 tablet 0   loratadine (CLARITIN) 10 MG tablet Take 10 mg by mouth daily.     rosuvastatin (CRESTOR) 5 MG tablet TAKE ONE TABLET BY MOUTH ON MONDAYS, WEDNESDAYS, AND FRIDAYS FOR CHOLESTEROL 36 tablet 1   senna-docusate (SENOKOT-S) 8.6-50 MG tablet Take 2 tablets by mouth at  bedtime. For AFTER surgery, do not take if having diarrhea 30 tablet 0   venlafaxine XR (EFFEXOR-XR) 75 MG 24 hr capsule Take 1 capsule (75 mg total) by mouth daily with breakfast. 30 capsule 1   vitamin C (ASCORBIC ACID) 500 MG tablet Take 1,000 mg by mouth at bedtime.     No current facility-administered medications for this visit.    PHYSICAL EXAMINATION: ECOG PERFORMANCE STATUS: 2 - Symptomatic, <50% confined to bed  Vitals:   07/01/22 1158  BP: (!) 157/84  Pulse: 86  Resp: 18  Temp: 98.5 F (36.9 C)  SpO2: 97%   Wt Readings from Last 3 Encounters:  07/01/22 211 lb 12.8 oz (96.1 kg)  06/12/22 210 lb 12.8 oz (95.6 kg)  05/20/22 213 lb 3.2 oz (96.7 kg)     GENERAL:alert, no distress and comfortable SKIN: skin color normal, no rashes or significant lesions EYES: normal, Conjunctiva are pink and non-injected, sclera clear  NEURO: alert & oriented x 3 with fluent speech  LABORATORY DATA:  I have reviewed the data as listed    Latest Ref Rng & Units 07/01/2022    1:16 PM 06/19/2022    7:33 AM 06/12/2022    8:43 AM  CBC  WBC 4.0 - 10.5 K/uL 4.6  6.9  4.3   Hemoglobin 12.0 - 15.0 g/dL 13.7  14.7  13.5   Hematocrit 36.0 - 46.0 % 40.1  42.2  38.5   Platelets 150 - 400 K/uL 183  126  186         Latest Ref Rng & Units 07/01/2022    1:16 PM 06/19/2022    7:33 AM 06/12/2022    8:43 AM  CMP  Glucose 70 - 99 mg/dL 112  126  132   BUN 6  - 20 mg/dL _0 Creatinine 0.44 - 1.00 mg/dL 0.70  0.71  0.63   Sodium 135 - 145 mmol/L 142  143  141   Potassium 3.5 - 5.1 mmol/L 3.8  3.9  3.8   Chloride 98 - 111 mmol/L 108  110  110   CO2 22 - 32 mmol/L 32  28  26   Calcium 8.9 - 10.3 mg/dL 9.2  9.8  9.6   Total Protein 6.5 - 8.1 g/dL 7.0  7.1  6.8   Total Bilirubin 0.3 - 1.2 mg/dL 0.7  0.6  0.5   Alkaline Phos 38 - 126 U/L 104  86  79   AST 15 - 41 U/L 149  81  77   ALT 0 - 44 U/L 191  117  129       RADIOGRAPHIC STUDIES: I have personally reviewed the radiological images as listed and agreed with the findings in the report. No results found.    No orders of the defined types were placed in this encounter.  All questions were answered. The patient knows to call the clinic with any problems, questions or concerns. No barriers to learning was detected. The total time spent in the appointment was 30 minutes.     Truitt Merle, MD 07/01/2022   I, Wilburn Mylar, am acting as scribe for Truitt Merle, MD.   I have reviewed the above documentation for accuracy and completeness, and I agree with the above.

## 2022-07-01 NOTE — Progress Notes (Signed)
REFERRING PROVIDER: Truitt Merle, MD [address] [place],  [place] [address]  PRIMARY PROVIDER:  Kathyrn Drown, MD  PRIMARY REASON FOR VISIT:  Encounter Diagnoses  Name Primary?   Adenocarcinoma (epithelial) of ovary, left (HCC) Yes   Family history of breast cancer    Family history of prostate cancer     HISTORY OF PRESENT ILLNESS:   [name], a 61 y.o. female, was seen for a [place] cancer genetics consultation at the request of Dr. Burr Medico due to a personal history of ovarian cancer.  [name] presents to clinic today to discuss the possibility of a hereditary predisposition to cancer, to discuss genetic testing, and to further clarify her future cancer risks, as well as potential cancer risks for family members.   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    CANCER HISTORY:  Oncology History  Adenocarcinoma (epithelial) of ovary, left ([place])  12/13/2021 Imaging   EXAM: CT ABDOMEN AND PELVIS WITH CONTRAST  IMPRESSION: 1. 6.6 cm hyperdense left adnexal/ovarian mass, likely a hemorrhagic cyst/mass. Recommend follow-up pelvic ultrasound. 2. 1.5 cm hyperenhancing lesion in the right hepatic lobe. Most likely differentials include hemangioma, FNH, adenoma. Consider nonemergent follow-up MRI abdomen with contrast.   01/03/2022 Tumor Marker   CA 125: 9.7 (WNL)   01/14/2022 Imaging   EXAM: MRI ABDOMEN WITHOUT AND WITH CONTRAST  IMPRESSION: Enhancing 13 mm segment VI hepatic lesion measuring 13 mm demonstrates nonspecific imaging characteristics but which is favored to reflect a benign etiology such as focal nodular hyperplasia or a hepatic adenoma. Recommend follow-up MRI in 6 months with and without EOVIST contrast for more definitive characterization and to assess stability.   03/12/2022 Pathology Results   FINAL MICROSCOPIC DIAGNOSIS:   A.   FALLOPIAN TUBE AND OVARY, LEFT, SALPINGO OOPHORECTOMY:  -     Adenocarcinoma, most consistent with metastatic colorectal  adenocarcinoma, see Comment.  -    Fallopian tube, negative for malignancy/intraepithelial carcinoma (STIC).   B.   PELVIC SIDEWALL, RIGHT, BIOPSY:  -    Benign, fibroelastic nodule.  -    Negative for malignancy.   C.   CUL DE SAC, POSTERIOR, BIOPSY:  -    Negative for malignancy.   D.   CUL DE SAC, ANTERIOR, BIOPSY:  -    Negative for malignancy.   E.   PELVIC SIDEWALL, LEFT, BIOPSY:  -    Negative for malignancy.   F.   PARACOLIC GUTTER, LEFT, BIOPSY:  -    Negative for malignancy.   G.   PARACOLIC GUTTER, RIGHT, BIOPSY:  -    Negative for malignancy.   H.   OMENTUM:  -    Negative for malignancy.   I.   OVARY REMNANT, LEFT, EXCISION:  -    Adenocarcinoma, most consistent with metastatic colorectal  adenocarcinoma.   COMMENT:  The tumor was interrogated with immunohistochemical (IHC) stains.  The tumor cells are diffusely and strongly positive for CDX-2, CK20 and mCEA and the tumor cells are CK7 negative.   This immunoprofile strongly favors metastatic colorectal adenocarcinoma.   The tumor cells are PAX8 negative to focally equivocal, a finding which is non-contributory. Primary ovarian mucinous adenocarcinoma can be CK20, CDX-2 and mCEA positive; although the staining pattern is typically focal and not diffuse, as in this case.  Also, supportive of a metastasis is CK7 negativity (primary ovarian mucinous carcinoma is typically CK7 positive).   There are  rare cells with intracytoplasmic mucin  (mucicarmine stain).   The best IHC stain to differentiate between metastatic colorectal  adenocarcinoma and primary ovarian mucinous adenocarcinoma is SATB2. This marker has been ordered and will be performed at [facility].  The result will be reported in an addendum.   Furthermore, the tumor cells are negative for serous markers (p16, p53 (wildtype), WT-1) and negative for endometrioid markers (ER and vimentin).  The Ki-67  mitotic index is high.  The IHC stains and mucicarmine stain have satisfactory controls.    ADDENDUM:  The tumor was interrogated with SATB2 immunohistochemical (IHC) stain, performed at [facility].  The tumor has diffuse strong nuclear reactivity.  This result, in combination with the prior IHC results, is essentially diagnostic of a lower GI (colorectal) or appendiceal primary tumor.  The control is satisfactory.    03/28/2022 Procedure   Colonoscopy, Dr. Bryan Lemma  Impression: - Two 2 to 3 mm polyps in the sigmoid colon, removed with a cold snare. Resected and retrieved. - The remainder of the colon was normal, including retoflexed views of the right colon. - The examined portion of the ileum was normal. - Non-bleeding internal hemorrhoids.   04/01/2022 Initial Diagnosis   Adenocarcinoma (epithelial) of ovary, left ([place])   04/17/2022 Cancer Staging   Staging form: Ovary, Fallopian Tube, and Primary Peritoneal Carcinoma, AJCC 8th Edition - Clinical stage from 04/17/2022: FIGO Stage IIB (cT2b, cN0, cM0) - Signed by Truitt Merle, MD on 04/22/2022 Stage prefix: Initial diagnosis Histologic grade (G): GX Histologic grading system: 4 grade system   04/29/2022 -  Chemotherapy   Patient is on Treatment Plan : COLORECTAL Xelox ([place]ox)(130/850) q21d     04/30/2022 - 05/20/2022 Chemotherapy   Patient is on Treatment Plan : COLORECTAL Xelox (Capeox) q21d       RISK FACTORS:  Mammogram within the last year: up to date Number of breast biopsies: L breast biopsies in 2018 (fibrocystic changes) Colonoscopy: yes;  2023 . Hysterectomy: yes in 2011 Ovaries intact: no; R ovary and FT removed in 2011; L ovary removed in June 2023 Menarche was at age 43.  First live birth at age 25.  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   BMI 35.0-35.9,adult    Cancer ([place])  03/12/2022   colon adenoca on ovary   Gall stone    GERD (gastroesophageal reflux disease)    Normal spontaneous vaginal delivery    2   Pneumonia     Past Surgical History:  Procedure Laterality Date   BREAST BIOPSY Left 07/15/2017   2 masses   BUNIONECTOMY  08/2021   COLONOSCOPY N/A 06/09/2014   Procedure: COLONOSCOPY;  Surgeon: Rogene Houston, MD;  Location: [facility];  Service: Endoscopy;  Laterality: N/A;  830-moved to 945 Ann to notify pt   COLONOSCOPY  03/28/2022   HYSTEROTOMY     LAPAROSCOPIC TOTAL HYSTERECTOMY  07/18/2010   RSO   NECK SURGERY  11/2012   c6 c7   ROBOTIC ASSISTED SALPINGO OOPHERECTOMY Left 03/12/2022   Procedure: XI ROBOTIC ASSISTED LEFT SALPINGO OOPHORECTOMY WITH STAGING;  Surgeon: Lafonda Mosses, MD;  Location: [facility];  Service: Gynecology;  Laterality: Left;    FAMILY HISTORY:  We obtained a detailed, 4-generation family history.  Significant diagnoses are listed below: Family History  Problem Relation Age of Onset   Prostate cancer Father 72  Breast cancer Sister 65       metastatic   Prostate cancer Brother        dx after age 67; x2 brothers   Breast cancer Cousin        mat female cousin; dx 50s      [name]'s sister had negative genetic testing in April 2022 through [facility].  The CustomNext-Cancer+RNAinsight panel offered by [facility] includes sequencing and rearrangement analysis for the following 47 genes:  APC, ATM, AXIN2, BARD1, BMPR1A, BRCA1, BRCA2, BRIP1, CDH1, CDK4, CDKN2A, CHEK2, DICER1, EPCAM, GREM1, HOXB13, MEN1, MLH1, MSH2, MSH3, MSH6, MUTYH, NBN, NF1, NF2, NTHL1, PALB2, PMS2, POLD1, POLE, PTEN, RAD51C, RAD51D, RECQL, RET, SDHA, SDHAF2, SDHB, SDHC, SDHD, SMAD4, SMARCA4, STK11, TP53, TSC1, TSC2, and VHL.  RNA data is routinely analyzed for use in variant interpretation for all genes.  Variants of uncertain significance were detected in PMS2 and RET.   [name] is unaware of  other previous family history of genetic testing for hereditary cancer risks. There is no reported Ashkenazi Jewish ancestry. There is no known consanguinity.  GENETIC COUNSELING ASSESSMENT: [name] is a 61 y.o. female with a personal history of which is somewhat suggestive of a hereditary cancer syndrome and predisposition to cancer given her personal history of ovarian cancer and the presence of related cancers (breast, prostate) in her family. We, therefore, discussed and recommended the following at today's visit.   DISCUSSION: We discussed that 5 - 10% of cancer is hereditary.  Most cases of heredtiary ovarian cancer are associated with mutations in BRCA1/2.  There are other genes that can be associated with hereditary ovarian or prostate cancer syndromes.  We discussed that testing is beneficial for several reasons including knowing how to follow individuals for their cancer risks, identifying whether potential treatment options, such as PARP inhibitors, would be beneficial, and understanding if other family members could be at risk for cancer and allowing them to undergo genetic testing.   We reviewed the characteristics, features and inheritance patterns of hereditary cancer syndromes. We also discussed genetic testing, including the appropriate family members to test, the process of testing, insurance coverage and turn-around-time for results. We discussed the implications of a negative, positive, carrier and/or variant of uncertain significant result. We recommended [name] pursue genetic testing for a panel that includes genes associated with ovarian, breast, prostate, and other cancers.   The CancerNext-Expanded gene panel offered by [facility] and includes sequencing, rearrangement, and RNA analysis for the following 77 genes: AIP, ALK, APC, ATM, AXIN2, BAP1, BARD1, BLM, BMPR1A, BRCA1, BRCA2, BRIP1, CDC73, CDH1, CDK4, CDKN1B, CDKN2A, CHEK2, CTNNA1, DICER1, FANCC, FH, FLCN, GALNT12,  KIF1B, LZTR1, MAX, MEN1, MET, MLH1, MSH2, MSH3, MSH6, MUTYH, NBN, NF1, NF2, NTHL1, PALB2, PHOX2B, PMS2, POT1, PRKAR1A, PTCH1, PTEN, RAD51C, RAD51D, RB1, RECQL, RET, SDHA, SDHAF2, SDHB, SDHC, SDHD, SMAD4, SMARCA4, SMARCB1, SMARCE1, STK11, SUFU, TMEM127, TP53, TSC1, TSC2, VHL and XRCC2 (sequencing and deletion/duplication); EGFR, EGLN1, HOXB13, KIT, MITF, PDGFRA, POLD1, and POLE (sequencing only); EPCAM and GREM1 (deletion/duplication only).   Based on [name]'s personal history of ovarian cancer, she meets medical criteria for genetic testing. Despite that she meets criteria, she may still have an out of pocket cost. We discussed that if her out of pocket cost for testing is over $100, the laboratory should contact her and discuss the self-pay prices and/or patient pay assistance programs.    We discussed that genetic testing through [facility] will test for hereditary mutations that  could explain her diagnosis of cancer.  However, homologous recombination testing (HRD) is genetic testing performed on the tumor that can determine genetic changes that could influence her management such as eligibility for targeted therapies, such as PARP inhibitors.  [facility] was requested on her tumor from surgery date 03/12/2022. [facility] CDx includes sequencing and large rearrangement analysis of BRCA1/2 and genomic instability status through loss of heterozygosity (LOH), telomeric allelic imbalance (TAI) and large-scale state transitions (LST).   PLAN: After considering the risks, benefits, and limitations, [name] provided informed consent to pursue genetic testing and the blood sample was sent to [facility] for analysis of the CancerNext-Expanded +RNAinsight Panel. Results should be available within approximately 3 weeks' time, at which point they will be disclosed by telephone to [name], as will any additional recommendations warranted by these results. [facility] was also requested,  and reuslts are expected in ~4 weeks. [name] will receive a summary of her genetic counseling visit and a copy of her results once available. This information will also be available in Epic.   [name] questions were answered to her satisfaction today. Our contact information was provided should additional questions or concerns arise. Thank you for the referral and allowing [place] to share in the care of your patient.   Dredyn Gubbels M. Joette Catching, [place], [facility] Genetic Counselor [email or web address] (P) [phone]  The patient was seen for a total of 40 minutes in face-to-face genetic counseling.  She was accompanied by her husband, Elta Guadeloupe. Drs. Lindi Adie and/or Burr Medico were available to discuss this case as needed.    _______________________________________________________________________ For Office Staff:  Number of people involved in session: 2 Was an Intern/ student involved with case: no

## 2022-07-02 ENCOUNTER — Inpatient Hospital Stay: Payer: BC Managed Care – PPO

## 2022-07-02 VITALS — BP 171/90 | HR 85 | Temp 98.5°F | Resp 17

## 2022-07-02 DIAGNOSIS — C562 Malignant neoplasm of left ovary: Secondary | ICD-10-CM

## 2022-07-02 MED ORDER — OXALIPLATIN CHEMO INJECTION 100 MG/20ML
110.0000 mg/m2 | Freq: Once | INTRAVENOUS | Status: AC
  Administered 2022-07-02: 230 mg via INTRAVENOUS
  Filled 2022-07-02: qty 46

## 2022-07-02 MED ORDER — SODIUM CHLORIDE 0.9 % IV SOLN
10.0000 mg | Freq: Once | INTRAVENOUS | Status: AC
  Administered 2022-07-02: 10 mg via INTRAVENOUS
  Filled 2022-07-02: qty 10

## 2022-07-02 MED ORDER — PALONOSETRON HCL INJECTION 0.25 MG/5ML
0.2500 mg | Freq: Once | INTRAVENOUS | Status: AC
  Administered 2022-07-02: 0.25 mg via INTRAVENOUS
  Filled 2022-07-02: qty 5

## 2022-07-02 MED ORDER — DEXTROSE 5 % IV SOLN: Freq: Once | INTRAVENOUS | Status: AC

## 2022-07-02 NOTE — Patient Instructions (Signed)
[facility] MEDICAL ONCOLOGY  Discharge Instructions: Thank you for choosing [facility] to provide your oncology and hematology care.   If you have a lab appointment with the Cancer Center, please go directly to the Cancer Center and check in at the registration area.   Wear comfortable clothing and clothing appropriate for easy access to any Portacath or PICC line.   We strive to give you quality time with your provider. You may need to reschedule your appointment if you arrive late (15 or more minutes).  Arriving late affects you and other patients whose appointments are after yours.  Also, if you miss three or more appointments without notifying the office, you may be dismissed from the clinic at the provider's discretion.      For prescription refill requests, have your pharmacy contact our office and allow 72 hours for refills to be completed.    Today you received the following chemotherapy and/or immunotherapy agents: Oxaliplatin      To help prevent nausea and vomiting after your treatment, we encourage you to take your nausea medication as directed.  BELOW ARE SYMPTOMS THAT SHOULD BE REPORTED IMMEDIATELY: *FEVER GREATER THAN 100.4 F (38 C) OR HIGHER *CHILLS OR SWEATING *NAUSEA AND VOMITING THAT IS NOT CONTROLLED WITH YOUR NAUSEA MEDICATION *UNUSUAL SHORTNESS OF BREATH *UNUSUAL BRUISING OR BLEEDING *URINARY PROBLEMS (pain or burning when urinating, or frequent urination) *BOWEL PROBLEMS (unusual diarrhea, constipation, pain near the anus) TENDERNESS IN MOUTH AND THROAT WITH OR WITHOUT PRESENCE OF ULCERS (sore throat, sores in mouth, or a toothache) UNUSUAL RASH, SWELLING OR PAIN  UNUSUAL VAGINAL DISCHARGE OR ITCHING   Items with * indicate a potential emergency and should be followed up as soon as possible or go to the Emergency Department if any problems should occur.  Please show the CHEMOTHERAPY ALERT CARD or IMMUNOTHERAPY ALERT CARD at check-in  to the Emergency Department and triage nurse.  Should you have questions after your visit or need to cancel or reschedule your appointment, please contact [facility] MEDICAL ONCOLOGY  Dept: [phone]  and follow the prompts.  Office hours are 8:00 a.m. to 4:30 p.m. Monday - Friday. Please note that voicemails left after 4:00 p.m. may not be returned until the following business day.  We are closed weekends and major holidays. You have access to a nurse at all times for urgent questions. Please call the main number to the clinic Dept: [phone] and follow the prompts.   For any non-urgent questions, you may also contact your provider using MyChart. We now offer e-Visits for anyone 18 and older to request care online for non-urgent symptoms. For details visit [email or web address].   Also download the MyChart app! Go to the app store, search "MyChart", open the app, select , and log in with your MyChart username and password.  Masks are optional in the cancer centers. If you would like for your care team to wear a mask while they are taking care of you, please let them know. You may have one support Seiji Wiswell who is at least 59 years old accompany you for your appointments. 

## 2022-07-03 ENCOUNTER — Encounter: Payer: Self-pay | Admitting: Genetic Counselor

## 2022-07-03 ENCOUNTER — Other Ambulatory Visit (HOSPITAL_COMMUNITY): Payer: Self-pay

## 2022-07-03 ENCOUNTER — Encounter: Payer: Self-pay | Admitting: Hematology

## 2022-07-03 ENCOUNTER — Other Ambulatory Visit: Payer: Self-pay

## 2022-07-05 ENCOUNTER — Inpatient Hospital Stay: Payer: BC Managed Care – PPO

## 2022-07-05 ENCOUNTER — Other Ambulatory Visit (HOSPITAL_COMMUNITY): Payer: Self-pay

## 2022-07-05 ENCOUNTER — Other Ambulatory Visit: Payer: Self-pay

## 2022-07-05 VITALS — BP 148/76 | HR 73 | Temp 98.7°F | Resp 16

## 2022-07-05 DIAGNOSIS — C562 Malignant neoplasm of left ovary: Secondary | ICD-10-CM

## 2022-07-05 MED ORDER — ONDANSETRON HCL 4 MG/2ML IJ SOLN
8.0000 mg | Freq: Once | INTRAMUSCULAR | Status: AC
  Administered 2022-07-05: 8 mg via INTRAVENOUS
  Filled 2022-07-05: qty 4

## 2022-07-05 MED ORDER — SODIUM CHLORIDE 0.9 % IV SOLN: Freq: Once | INTRAVENOUS | Status: AC

## 2022-07-05 MED ORDER — SODIUM CHLORIDE 0.9 % IV SOLN: Freq: Once | INTRAVENOUS | Status: DC

## 2022-07-05 MED ORDER — SODIUM CHLORIDE 0.9 % IV SOLN
10.0000 mg | Freq: Once | INTRAVENOUS | Status: AC
  Administered 2022-07-05: 10 mg via INTRAVENOUS
  Filled 2022-07-05: qty 10

## 2022-07-05 NOTE — Patient Instructions (Addendum)
Rehydration, Adult Rehydration is the replacement of body fluids, salts, and minerals (electrolytes) that are lost during dehydration. Dehydration is when there is not enough water or other fluids in the body. This happens when you lose more fluids than you take in. Common causes of dehydration include: Not drinking enough fluids. This can occur when you are ill or doing activities that require a lot of energy, especially in hot weather. Conditions that cause loss of water or other fluids, such as diarrhea, vomiting, sweating, or urinating a lot. Other illnesses, such as fever or infection. Certain medicines, such as those that remove excess fluid from the body (diuretics). Symptoms of mild or moderate dehydration may include thirst, dry lips and mouth, and dizziness. Symptoms of severe dehydration may include increased heart rate, confusion, fainting, and not urinating. For severe dehydration, you may need to get fluids through an IV at the hospital. For mild or moderate dehydration, you can usually rehydrate at home by drinking certain fluids as told by your health care provider. What are the risks? Generally, rehydration is safe. However, taking in too much fluid (overhydration) can be a problem. This is rare. Overhydration can cause an electrolyte imbalance, kidney failure, or a decrease in salt (sodium) levels in the body. Supplies needed You will need an oral rehydration solution (ORS) if your health care provider tells you to use one. This is a drink to treat dehydration. It can be found in pharmacies and retail stores. How to rehydrate Fluids Follow instructions from your health care provider for rehydration. The kind of fluid and the amount you should drink depend on your condition. In general, you should choose drinks that you prefer. If told by your health care provider, drink an ORS. Make an ORS by following instructions on the package. Start by drinking small amounts, about  cup (120  mL) every 5-10 minutes. Slowly increase how much you drink until you have taken the amount recommended by your health care provider. Drink enough clear fluids to keep your urine pale yellow. If you were told to drink an ORS, finish it first, then start slowly drinking other clear fluids. Drink fluids such as: Water. This includes sparkling water and flavored water. Drinking only water can lead to having too little sodium in your body (hyponatremia). Follow the advice of your health care provider. Water from ice chips you suck on. Fruit juice with water you add to it (diluted). Sports drinks. Hot or cold herbal teas. Broth-based soups. Milk or milk products. Food Follow instructions from your health care provider about what to eat while you rehydrate. Your health care provider may recommend that you slowly begin eating regular foods in small amounts. Eat foods that contain a healthy balance of electrolytes, such as bananas, oranges, potatoes, tomatoes, and spinach. Avoid foods that are greasy or contain a lot of sugar. In some cases, you may get nutrition through a feeding tube that is passed through your nose and into your stomach (nasogastric tube, or NG tube). This may be done if you have uncontrolled vomiting or diarrhea. Beverages to avoid  Certain beverages may make dehydration worse. While you rehydrate, avoid drinking alcohol. How to tell if you are recovering from dehydration You may be recovering from dehydration if: You are urinating more often than before you started rehydrating. Your urine is pale yellow. Your energy level improves. You vomit less frequently. You have diarrhea less frequently. Your appetite improves or returns to normal. You feel less dizzy or less light-headed.   Your skin tone and color start to look more normal. Follow these instructions at home: Take over-the-counter and prescription medicines only as told by your health care provider. Do not take sodium  tablets. Doing this can lead to having too much sodium in your body (hypernatremia). Contact a health care provider if: You continue to have symptoms of mild or moderate dehydration, such as: Thirst. Dry lips. Slightly dry mouth. Dizziness. Dark urine or less urine than normal. Muscle cramps. You continue to vomit or have diarrhea. Get help right away if you: Have symptoms of dehydration that get worse. Have a fever. Have a severe headache. Have been vomiting and the following happens: Your vomiting gets worse or does not go away. Your vomit includes blood or green matter (bile). You cannot eat or drink without vomiting. Have problems with urination or bowel movements, such as: Diarrhea that gets worse or does not go away. Blood in your stool (feces). This may cause stool to look black and tarry. Not urinating, or urinating only a small amount of very dark urine, within 6-8 hours. Have trouble breathing. Have symptoms that get worse with treatment. These symptoms may represent a serious problem that is an emergency. Do not wait to see if the symptoms will go away. Get medical help right away. Call your local emergency services (911 in the [place] Do not drive yourself to the hospital. Summary Rehydration is the replacement of body fluids and minerals (electrolytes) that are lost during dehydration. Follow instructions from your health care provider for rehydration. The kind of fluid and amount you should drink depend on your condition. Slowly increase how much you drink until you have taken the amount recommended by your health care provider. Contact your health care provider if you continue to show signs of mild or moderate dehydration. This information is not intended to replace advice given to you by your health care provider. Make sure you discuss any questions you have with your health care provider. Document Revised: 11/24/2019 Document Reviewed: 10/04/2019 [employer] Patient  Education  2023 [employer]  Nausea, Adult Nausea is the feeling of having an upset stomach or that you are about to vomit. Nausea on its own is not usually a serious concern, but it may be an early sign of a more serious medical problem. As nausea gets worse, it can lead to vomiting. If vomiting develops, or if you are not able to drink enough fluids, you are at risk of becoming dehydrated. Dehydration can make you tired and thirsty, cause you to have a dry mouth, and decrease how often you urinate. Older adults and people with other diseases or a weak disease-fighting system (immune system) are at higher risk for dehydration. The main goals of treating your nausea are: To relieve your nausea. To limit repeated nausea episodes. To prevent vomiting and dehydration. Follow these instructions at home: Watch your symptoms for any changes. Tell your health care provider about them. Eating and drinking     Take an oral rehydration solution (ORS). This is a drink that is sold at pharmacies and retail stores. Drink clear fluids slowly and in small amounts as you are able. Clear fluids include water, ice chips, low-calorie sports drinks, and fruit juice that has water added (diluted fruit juice). Eat bland, easy-to-digest foods in small amounts as you are able. These foods include bananas, applesauce, rice, lean meats, toast, and crackers. Avoid drinking fluids that contain a lot of sugar or caffeine, such as energy drinks, sports drinks,   and soda. Avoid alcohol. Avoid spicy or fatty foods. General instructions Take over-the-counter and prescription medicines only as told by your health care provider. Rest at home while you recover. Drink enough fluid to keep your urine pale yellow. Breathe slowly and deeply when you feel nauseous. Avoid smelling things that have strong odors. Wash your hands often using soap and water for at least 20 seconds. If soap and water are not available, use hand  sanitizer. Make sure that everyone in your household washes their hands well and often. Keep all follow-up visits. This is important. Contact a health care provider if: Your nausea gets worse. Your nausea does not go away after two days. You vomit multiple times. You cannot drink fluids without vomiting. You have any of the following: New symptoms. A fever. A headache. Muscle cramps. A rash. Pain while urinating. You feel light-headed or dizzy. Get help right away if: You have pain in your chest, neck, arm, or jaw. You feel extremely weak or you faint. You have vomit that is bright red or looks like coffee grounds. You have bloody or black stools (feces) or stools that look like tar. You have a severe headache, a stiff neck, or both. You have severe pain, cramping, or bloating in your abdomen. You have difficulty breathing or are breathing very quickly. Your heart is beating very quickly. Your skin feels cold and clammy. You feel confused. You have signs of dehydration, such as: Dark urine, very little urine, or no urine. Cracked lips. Dry mouth. Sunken eyes. Sleepiness. Weakness. These symptoms may be an emergency. Get help right away. Call 911. Do not wait to see if the symptoms will go away. Do not drive yourself to the hospital. Summary Nausea is the feeling that you have an upset stomach or that you are about to vomit. Nausea on its own is not usually a serious concern, but it may be an early sign of a more serious medical problem. If vomiting develops, or if you are not able to drink enough fluids, you are at risk of becoming dehydrated. Follow recommendations for eating and drinking and take over-the-counter and prescription medicines only as told by your health care provider. Contact a health care provider right away if your symptoms worsen or you have new symptoms. Keep all follow-up visits. This is important. This information is not intended to replace advice  given to you by your health care provider. Make sure you discuss any questions you have with your health care provider. Document Revised: 03/30/2021 Document Reviewed: 03/30/2021 Elsevier Patient Education  2023 [employer]  

## 2022-07-08 ENCOUNTER — Other Ambulatory Visit: Payer: Self-pay

## 2022-07-10 ENCOUNTER — Telehealth: Payer: Self-pay | Admitting: Genetic Counselor

## 2022-07-10 ENCOUNTER — Encounter: Payer: Self-pay | Admitting: Genetic Counselor

## 2022-07-10 ENCOUNTER — Inpatient Hospital Stay: Payer: BC Managed Care – PPO | Attending: Gynecologic Oncology

## 2022-07-10 VITALS — BP 139/79 | HR 82 | Temp 98.0°F | Resp 18 | Wt 204.5 lb

## 2022-07-10 DIAGNOSIS — C562 Malignant neoplasm of left ovary: Secondary | ICD-10-CM | POA: Diagnosis present

## 2022-07-10 DIAGNOSIS — Z79899 Other long term (current) drug therapy: Secondary | ICD-10-CM | POA: Diagnosis not present

## 2022-07-10 DIAGNOSIS — Z8042 Family history of malignant neoplasm of prostate: Secondary | ICD-10-CM | POA: Diagnosis not present

## 2022-07-10 DIAGNOSIS — K219 Gastro-esophageal reflux disease without esophagitis: Secondary | ICD-10-CM | POA: Insufficient documentation

## 2022-07-10 DIAGNOSIS — K635 Polyp of colon: Secondary | ICD-10-CM | POA: Diagnosis not present

## 2022-07-10 DIAGNOSIS — Z803 Family history of malignant neoplasm of breast: Secondary | ICD-10-CM | POA: Insufficient documentation

## 2022-07-10 DIAGNOSIS — R509 Fever, unspecified: Secondary | ICD-10-CM | POA: Diagnosis not present

## 2022-07-10 DIAGNOSIS — R0989 Other specified symptoms and signs involving the circulatory and respiratory systems: Secondary | ICD-10-CM | POA: Insufficient documentation

## 2022-07-10 DIAGNOSIS — Z1379 Encounter for other screening for genetic and chromosomal anomalies: Secondary | ICD-10-CM | POA: Insufficient documentation

## 2022-07-10 DIAGNOSIS — E860 Dehydration: Secondary | ICD-10-CM | POA: Diagnosis not present

## 2022-07-10 MED ORDER — SODIUM CHLORIDE 0.9 % IV SOLN: Freq: Once | INTRAVENOUS | Status: AC

## 2022-07-10 MED ORDER — SODIUM CHLORIDE 0.9 % IV SOLN: Freq: Once | INTRAVENOUS | Status: DC

## 2022-07-10 MED ORDER — DEXAMETHASONE SODIUM PHOSPHATE 10 MG/ML IJ SOLN
10.0000 mg | Freq: Once | INTRAMUSCULAR | Status: AC
  Administered 2022-07-10: 10 mg via INTRAVENOUS
  Filled 2022-07-10: qty 1

## 2022-07-10 MED ORDER — ONDANSETRON HCL 4 MG/2ML IJ SOLN
8.0000 mg | Freq: Once | INTRAMUSCULAR | Status: AC
  Administered 2022-07-10: 8 mg via INTRAVENOUS
  Filled 2022-07-10: qty 4

## 2022-07-10 NOTE — Patient Instructions (Signed)
Dehydration, Adult Dehydration is a condition in which there is not enough water or other fluids in the body. This happens when a person loses more fluids than he or she takes in. Important organs, such as the kidneys, brain, and heart, cannot function without a proper amount of fluids. Any loss of fluids from the body can lead to dehydration. Dehydration can be mild, moderate, or severe. It should be treated right away to prevent it from becoming severe. What are the causes? Dehydration may be caused by: Conditions that cause loss of water or other fluids, such as diarrhea, vomiting, or sweating or urinating a lot. Not drinking enough fluids, especially when you are ill or doing activities that require a lot of energy. Other illnesses and conditions, such as fever or infection. Certain medicines, such as medicines that remove excess fluid from the body (diuretics). Lack of safe drinking water. Not being able to get enough water and food. What increases the risk? The following factors may make you more likely to develop this condition: Having a long-term (chronic) illness that has not been treated properly, such as diabetes, heart disease, or kidney disease. Being 65 years of age or older. Having a disability. Living in a place that is high in altitude, where thinner, drier air causes more fluid loss. Doing exercises that put stress on your body for a long time (endurance sports). What are the signs or symptoms? Symptoms of dehydration depend on how severe it is. Mild or moderate dehydration Thirst. Dry lips or dry mouth. Dizziness or light-headedness, especially when standing up from a seated position. Muscle cramps. Dark urine. Urine may be the color of tea. Less urine or tears produced than usual. Headache. Severe dehydration Changes in skin. Your skin may be cold and clammy, blotchy, or pale. Your skin also may not return to normal after being lightly pinched and released. Little or  no tears, urine, or sweat. Changes in vital signs, such as rapid breathing and low blood pressure. Your pulse may be weak or may be faster than 100 beats a minute when you are sitting still. Other changes, such as: Feeling very thirsty. Sunken eyes. Cold hands and feet. Confusion. Being very tired (lethargic) or having trouble waking from sleep. Short-term weight loss. Loss of consciousness. How is this diagnosed? This condition is diagnosed based on your symptoms and a physical exam. You may have blood and urine tests to help confirm the diagnosis. How is this treated? Treatment for this condition depends on how severe it is. Treatment should be started right away. Do not wait until dehydration becomes severe. Severe dehydration is an emergency and needs to be treated in a hospital. Mild or moderate dehydration can be treated at home. You may be asked to: Drink more fluids. Drink an oral rehydration solution (ORS). This drink helps restore proper amounts of fluids and salts and minerals in the blood (electrolytes). Severe dehydration can be treated: With IV fluids. By correcting abnormal levels of electrolytes. This is often done by giving electrolytes through a tube that is passed through your nose and into your stomach (nasogastric tube, or NG tube). By treating the underlying cause of dehydration. Follow these instructions at home: Oral rehydration solution If told by your health care provider, drink an ORS: Make an ORS by following instructions on the package. Start by drinking small amounts, about  cup (120 mL) every 5-10 minutes. Slowly increase how much you drink until you have taken the amount recommended by your health   care provider. Eating and drinking        Drink enough clear fluid to keep your urine pale yellow. If you were told to drink an ORS, finish the ORS first and then start slowly drinking other clear fluids. Drink fluids such as: Water. Do not drink only  water. Doing that can lead to hyponatremia, which is having too little salt (sodium) in the body. Water from ice chips you suck on. Fruit juice that you have added water to (diluted fruit juice). Low-calorie sports drinks. Eat foods that contain a healthy balance of electrolytes, such as bananas, oranges, potatoes, tomatoes, and spinach. Do not drink alcohol. Avoid the following: Drinks that contain a lot of sugar. These include high-calorie sports drinks, fruit juice that is not diluted, and soda. Caffeine. Foods that are greasy or contain a lot of fat or sugar. General instructions Take over-the-counter and prescription medicines only as told by your health care provider. Do not take sodium tablets. Doing that can lead to having too much sodium in the body (hypernatremia). Return to your normal activities as told by your health care provider. Ask your health care provider what activities are safe for you. Keep all follow-up visits as told by your health care provider. This is important. Contact a health care provider if: You have muscle cramps, pain, or discomfort, such as: Pain in your abdomen and the pain gets worse or stays in one area (localizes). Stiff neck. You have a rash. You are more irritable than usual. You are sleepier or have a harder time waking than usual. You feel weak or dizzy. You feel very thirsty. Get help right away if you have: Any symptoms of severe dehydration. Symptoms of vomiting, such as: You cannot eat or drink without vomiting. Vomiting gets worse or does not go away. Vomit includes blood or green matter (bile). Symptoms that get worse with treatment. A fever. A severe headache. Problems with urination or bowel movements, such as: Diarrhea that gets worse or does not go away. Blood in your stool (feces). This may cause stool to look black and tarry. Not urinating, or urinating only a small amount of very dark urine, within 6-8 hours. Trouble  breathing. These symptoms may represent a serious problem that is an emergency. Do not wait to see if the symptoms will go away. Get medical help right away. Call your local emergency services (911 in the [place] Do not drive yourself to the hospital. Summary Dehydration is a condition in which there is not enough water or other fluids in the body. This happens when a person loses more fluids than he or she takes in. Treatment for this condition depends on how severe it is. Treatment should be started right away. Do not wait until dehydration becomes severe. Drink enough clear fluid to keep your urine pale yellow. If you were told to drink an oral rehydration solution (ORS), finish the ORS first and then start slowly drinking other clear fluids. Take over-the-counter and prescription medicines only as told by your health care provider. Get help right away if you have any symptoms of severe dehydration. This information is not intended to replace advice given to you by your health care provider. Make sure you discuss any questions you have with your health care provider. Document Revised: 01/30/2022 Document Reviewed: 05/06/2019 Elsevier Patient Education  2023 [employer]  

## 2022-07-10 NOTE — Telephone Encounter (Signed)
Revealed negative germline genetics.  Awaiting results of [facility] testing.

## 2022-07-11 ENCOUNTER — Other Ambulatory Visit: Payer: Self-pay | Admitting: Family Medicine

## 2022-07-12 ENCOUNTER — Encounter: Payer: Self-pay | Admitting: Hematology

## 2022-07-12 ENCOUNTER — Other Ambulatory Visit: Payer: Self-pay

## 2022-07-13 ENCOUNTER — Other Ambulatory Visit: Payer: Self-pay

## 2022-07-13 ENCOUNTER — Encounter: Payer: Self-pay | Admitting: Family Medicine

## 2022-07-15 ENCOUNTER — Inpatient Hospital Stay: Payer: BC Managed Care – PPO

## 2022-07-15 ENCOUNTER — Other Ambulatory Visit: Payer: Self-pay

## 2022-07-15 VITALS — BP 142/80 | HR 78 | Temp 98.7°F | Resp 16

## 2022-07-15 DIAGNOSIS — C562 Malignant neoplasm of left ovary: Secondary | ICD-10-CM | POA: Diagnosis not present

## 2022-07-15 MED ORDER — SODIUM CHLORIDE 0.9 % IV SOLN: Freq: Once | INTRAVENOUS | Status: AC

## 2022-07-15 MED ORDER — ONDANSETRON HCL 4 MG/2ML IJ SOLN
8.0000 mg | Freq: Once | INTRAMUSCULAR | Status: AC
  Administered 2022-07-15: 8 mg via INTRAVENOUS
  Filled 2022-07-15: qty 4

## 2022-07-15 MED ORDER — SODIUM CHLORIDE 0.9 % IV SOLN: Freq: Once | INTRAVENOUS | Status: DC

## 2022-07-15 MED ORDER — SODIUM CHLORIDE 0.9 % IV SOLN
10.0000 mg | Freq: Once | INTRAVENOUS | Status: AC
  Administered 2022-07-15: 10 mg via INTRAVENOUS
  Filled 2022-07-15: qty 10

## 2022-07-15 NOTE — Patient Instructions (Signed)
Rehydration, Adult Rehydration is the replacement of body fluids, salts, and minerals (electrolytes) that are lost during dehydration. Dehydration is when there is not enough water or other fluids in the body. This happens when you lose more fluids than you take in. Common causes of dehydration include: Not drinking enough fluids. This can occur when you are ill or doing activities that require a lot of energy, especially in hot weather. Conditions that cause loss of water or other fluids, such as diarrhea, vomiting, sweating, or urinating a lot. Other illnesses, such as fever or infection. Certain medicines, such as those that remove excess fluid from the body (diuretics). Symptoms of mild or moderate dehydration may include thirst, dry lips and mouth, and dizziness. Symptoms of severe dehydration may include increased heart rate, confusion, fainting, and not urinating. For severe dehydration, you may need to get fluids through an IV at the hospital. For mild or moderate dehydration, you can usually rehydrate at home by drinking certain fluids as told by your health care provider. What are the risks? Generally, rehydration is safe. However, taking in too much fluid (overhydration) can be a problem. This is rare. Overhydration can cause an electrolyte imbalance, kidney failure, or a decrease in salt (sodium) levels in the body. Supplies needed You will need an oral rehydration solution (ORS) if your health care provider tells you to use one. This is a drink to treat dehydration. It can be found in pharmacies and retail stores. How to rehydrate Fluids Follow instructions from your health care provider for rehydration. The kind of fluid and the amount you should drink depend on your condition. In general, you should choose drinks that you prefer. If told by your health care provider, drink an ORS. Make an ORS by following instructions on the package. Start by drinking small amounts, about  cup (120  mL) every 5-10 minutes. Slowly increase how much you drink until you have taken the amount recommended by your health care provider. Drink enough clear fluids to keep your urine pale yellow. If you were told to drink an ORS, finish it first, then start slowly drinking other clear fluids. Drink fluids such as: Water. This includes sparkling water and flavored water. Drinking only water can lead to having too little sodium in your body (hyponatremia). Follow the advice of your health care provider. Water from ice chips you suck on. Fruit juice with water you add to it (diluted). Sports drinks. Hot or cold herbal teas. Broth-based soups. Milk or milk products. Food Follow instructions from your health care provider about what to eat while you rehydrate. Your health care provider may recommend that you slowly begin eating regular foods in small amounts. Eat foods that contain a healthy balance of electrolytes, such as bananas, oranges, potatoes, tomatoes, and spinach. Avoid foods that are greasy or contain a lot of sugar. In some cases, you may get nutrition through a feeding tube that is passed through your nose and into your stomach (nasogastric tube, or NG tube). This may be done if you have uncontrolled vomiting or diarrhea. Beverages to avoid  Certain beverages may make dehydration worse. While you rehydrate, avoid drinking alcohol. How to tell if you are recovering from dehydration You may be recovering from dehydration if: You are urinating more often than before you started rehydrating. Your urine is pale yellow. Your energy level improves. You vomit less frequently. You have diarrhea less frequently. Your appetite improves or returns to normal. You feel less dizzy or less light-headed.   Your skin tone and color start to look more normal. Follow these instructions at home: Take over-the-counter and prescription medicines only as told by your health care provider. Do not take sodium  tablets. Doing this can lead to having too much sodium in your body (hypernatremia). Contact a health care provider if: You continue to have symptoms of mild or moderate dehydration, such as: Thirst. Dry lips. Slightly dry mouth. Dizziness. Dark urine or less urine than normal. Muscle cramps. You continue to vomit or have diarrhea. Get help right away if you: Have symptoms of dehydration that get worse. Have a fever. Have a severe headache. Have been vomiting and the following happens: Your vomiting gets worse or does not go away. Your vomit includes blood or green matter (bile). You cannot eat or drink without vomiting. Have problems with urination or bowel movements, such as: Diarrhea that gets worse or does not go away. Blood in your stool (feces). This may cause stool to look black and tarry. Not urinating, or urinating only a small amount of very dark urine, within 6-8 hours. Have trouble breathing. Have symptoms that get worse with treatment. These symptoms may represent a serious problem that is an emergency. Do not wait to see if the symptoms will go away. Get medical help right away. Call your local emergency services (911 in the [place] Do not drive yourself to the hospital. Summary Rehydration is the replacement of body fluids and minerals (electrolytes) that are lost during dehydration. Follow instructions from your health care provider for rehydration. The kind of fluid and amount you should drink depend on your condition. Slowly increase how much you drink until you have taken the amount recommended by your health care provider. Contact your health care provider if you continue to show signs of mild or moderate dehydration. This information is not intended to replace advice given to you by your health care provider. Make sure you discuss any questions you have with your health care provider. Document Revised: 11/24/2019 Document Reviewed: 10/04/2019 Elsevier Patient  Education  2023 [employer]  

## 2022-07-16 ENCOUNTER — Other Ambulatory Visit (HOSPITAL_COMMUNITY): Payer: Self-pay

## 2022-07-19 ENCOUNTER — Telehealth: Payer: Self-pay | Admitting: Hematology

## 2022-07-19 NOTE — Telephone Encounter (Signed)
Left message with follow-up appointment per 9/25 los.

## 2022-07-20 ENCOUNTER — Other Ambulatory Visit: Payer: Self-pay

## 2022-07-23 ENCOUNTER — Other Ambulatory Visit: Payer: Self-pay

## 2022-07-25 ENCOUNTER — Telehealth: Payer: Self-pay

## 2022-07-25 ENCOUNTER — Other Ambulatory Visit: Payer: Self-pay

## 2022-07-25 ENCOUNTER — Ambulatory Visit (HOSPITAL_COMMUNITY)
Admission: RE | Admit: 2022-07-25 | Discharge: 2022-07-25 | Disposition: A | Discharge status: Home / Self Care | Payer: BC Managed Care – PPO | Source: Ambulatory Visit | Attending: Physician Assistant | Admitting: Physician Assistant

## 2022-07-25 ENCOUNTER — Encounter: Payer: Self-pay | Admitting: Family Medicine

## 2022-07-25 ENCOUNTER — Inpatient Hospital Stay (HOSPITAL_BASED_OUTPATIENT_CLINIC_OR_DEPARTMENT_OTHER): Payer: BC Managed Care – PPO | Admitting: Physician Assistant

## 2022-07-25 ENCOUNTER — Encounter: Payer: Self-pay | Admitting: Nurse Practitioner

## 2022-07-25 ENCOUNTER — Inpatient Hospital Stay: Payer: BC Managed Care – PPO

## 2022-07-25 VITALS — BP 149/74 | HR 111 | Temp 99.1°F | Resp 19 | Ht 65.0 in | Wt 207.7 lb

## 2022-07-25 DIAGNOSIS — B999 Unspecified infectious disease: Secondary | ICD-10-CM | POA: Insufficient documentation

## 2022-07-25 DIAGNOSIS — C562 Malignant neoplasm of left ovary: Secondary | ICD-10-CM

## 2022-07-25 DIAGNOSIS — R0989 Other specified symptoms and signs involving the circulatory and respiratory systems: Secondary | ICD-10-CM

## 2022-07-25 DIAGNOSIS — R509 Fever, unspecified: Secondary | ICD-10-CM | POA: Diagnosis not present

## 2022-07-25 LAB — CBC WITH DIFFERENTIAL (CANCER CENTER ONLY)
Abs Immature Granulocytes: 0.01 10*3/uL (ref 0.00–0.07)
Basophils Absolute: 0 10*3/uL (ref 0.0–0.1)
Basophils Relative: 0 %
Eosinophils Absolute: 0.2 10*3/uL (ref 0.0–0.5)
Eosinophils Relative: 5 %
HCT: 41.9 % (ref 36.0–46.0)
Hemoglobin: 14.5 g/dL (ref 12.0–15.0)
Immature Granulocytes: 0 %
Lymphocytes Relative: 21 %
Lymphs Abs: 1.1 10*3/uL (ref 0.7–4.0)
MCH: 32.3 pg (ref 26.0–34.0)
MCHC: 34.6 g/dL (ref 30.0–36.0)
MCV: 93.3 fL (ref 80.0–100.0)
Monocytes Absolute: 0.7 10*3/uL (ref 0.1–1.0)
Monocytes Relative: 14 %
Neutro Abs: 3.2 10*3/uL (ref 1.7–7.7)
Neutrophils Relative %: 60 %
Platelet Count: 146 10*3/uL — ABNORMAL LOW (ref 150–400)
RBC: 4.49 MIL/uL (ref 3.87–5.11)
RDW: 19.2 % — ABNORMAL HIGH (ref 11.5–15.5)
WBC Count: 5.2 10*3/uL (ref 4.0–10.5)
nRBC: 0 % (ref 0.0–0.2)

## 2022-07-25 LAB — URINALYSIS, COMPLETE (UACMP) WITH MICROSCOPIC
Bacteria, UA: NONE SEEN
Bilirubin Urine: NEGATIVE
Glucose, UA: NEGATIVE mg/dL
Ketones, ur: NEGATIVE mg/dL
Leukocytes,Ua: NEGATIVE
Nitrite: NEGATIVE
Protein, ur: NEGATIVE mg/dL
Specific Gravity, Urine: 1.019 (ref 1.005–1.030)
pH: 5 (ref 5.0–8.0)

## 2022-07-25 LAB — CMP (CANCER CENTER ONLY)
ALT: 131 U/L — ABNORMAL HIGH (ref 0–44)
AST: 90 U/L — ABNORMAL HIGH (ref 15–41)
Albumin: 4.2 g/dL (ref 3.5–5.0)
Alkaline Phosphatase: 111 U/L (ref 38–126)
Anion gap: 6 (ref 5–15)
BUN: 11 mg/dL (ref 6–20)
CO2: 28 mmol/L (ref 22–32)
Calcium: 9.8 mg/dL (ref 8.9–10.3)
Chloride: 104 mmol/L (ref 98–111)
Creatinine: 0.7 mg/dL (ref 0.44–1.00)
GFR, Estimated: >60 mL/min (ref >60)
Glucose, Bld: 104 mg/dL — ABNORMAL HIGH (ref 70–99)
Potassium: 3.9 mmol/L (ref 3.5–5.1)
Sodium: 138 mmol/L (ref 135–145)
Total Bilirubin: 0.9 mg/dL (ref 0.3–1.2)
Total Protein: 7.4 g/dL (ref 6.5–8.1)

## 2022-07-25 MED ORDER — ACETAMINOPHEN 325 MG PO TABS
650.0000 mg | ORAL_TABLET | Freq: Once | ORAL | Status: AC
  Administered 2022-07-25: 650 mg via ORAL

## 2022-07-25 NOTE — Progress Notes (Signed)
Symptom Management Consult note [place]    Patient Care Team: Kathyrn Drown, MD as PCP - General (Family Medicine) Terrance Mass, MD (Inactive) as Consulting Physician (Obstetrics and Gynecology) Jovita Gamma, MD as Consulting Physician (Neurosurgery) Rogene Houston, MD as Consulting Physician (Gastroenterology) Truitt Merle, MD as Consulting Physician (Oncology)    Name of the patient: [name]  [id number]  Dec 13, 1960   Date of visit: 07/25/2022   Chief Complaint/Reason for visit: fever, cough   Current Therapy: Oxaliplatin, PO Capecitabine  Last treatment:  Day 1   Cycle 4 on 07/02/22   ASSESSMENT & PLAN: Patient is a 61 y.o. female  with oncologic history of ovarian adenocarcinoma followed by Dr. Burr Medico.  I have viewed most recent oncology note and lab work.    #) Ovarian adenocarcinoma - Next appointment with oncologist is 07/31/22.   #) Fever, cough -Patient afebrile to 99.1, tachycardic to 111.   -Patient given Tylenol in clinic as she has low grade temp. -Is well-appearing.  Clear lung exam.  Clinically patient does not have pneumonia. -Chest x-ray viewed by me is negative for acute infectious processes.  I viewed image and agree with radiologist impression. -UA is negative for infection. Urine and blood cultures in process. -Does appear clinically dehydrated and will return for IV fluids tomorrow as she is unable to stay today. -Suspect viral illness causing symptoms especially with her recent sick contacts. - Strict ED precautions discussed should symptoms worsen.       Heme/Onc History: Oncology History  Adenocarcinoma (epithelial) of ovary, left ([place])  12/13/2021 Imaging   EXAM: CT ABDOMEN AND PELVIS WITH CONTRAST  IMPRESSION: 1. 6.6 cm hyperdense left adnexal/ovarian mass, likely a hemorrhagic cyst/mass. Recommend follow-up pelvic ultrasound. 2. 1.5 cm hyperenhancing lesion in the right hepatic lobe. Most likely  differentials include hemangioma, FNH, adenoma. Consider nonemergent follow-up MRI abdomen with contrast.   01/03/2022 Tumor Marker   CA 125: 9.7 (WNL)   01/14/2022 Imaging   EXAM: MRI ABDOMEN WITHOUT AND WITH CONTRAST  IMPRESSION: Enhancing 13 mm segment VI hepatic lesion measuring 13 mm demonstrates nonspecific imaging characteristics but which is favored to reflect a benign etiology such as focal nodular hyperplasia or a hepatic adenoma. Recommend follow-up MRI in 6 months with and without EOVIST contrast for more definitive characterization and to assess stability.   03/12/2022 Pathology Results   FINAL MICROSCOPIC DIAGNOSIS:   A.   FALLOPIAN TUBE AND OVARY, LEFT, SALPINGO OOPHORECTOMY:  -    Adenocarcinoma, most consistent with metastatic colorectal  adenocarcinoma, see Comment.  -    Fallopian tube, negative for malignancy/intraepithelial carcinoma (STIC).   B.   PELVIC SIDEWALL, RIGHT, BIOPSY:  -    Benign, fibroelastic nodule.  -    Negative for malignancy.   C.   CUL DE SAC, POSTERIOR, BIOPSY:  -    Negative for malignancy.   D.   CUL DE SAC, ANTERIOR, BIOPSY:  -    Negative for malignancy.   E.   PELVIC SIDEWALL, LEFT, BIOPSY:  -    Negative for malignancy.   F.   PARACOLIC GUTTER, LEFT, BIOPSY:  -    Negative for malignancy.   G.   PARACOLIC GUTTER, RIGHT, BIOPSY:  -    Negative for malignancy.   H.   OMENTUM:  -    Negative for malignancy.   I.   OVARY REMNANT, LEFT, EXCISION:  -    Adenocarcinoma, most consistent with metastatic  colorectal  adenocarcinoma.   COMMENT:  The tumor was interrogated with immunohistochemical (IHC) stains.  The tumor cells are diffusely and strongly positive for CDX-2, CK20 and mCEA and the tumor cells are CK7 negative.   This immunoprofile strongly favors metastatic colorectal adenocarcinoma.   The tumor cells are PAX8 negative to focally equivocal, a finding which is non-contributory. Primary ovarian mucinous adenocarcinoma  can be CK20, CDX-2 and mCEA positive; although the staining pattern is typically focal and not diffuse, as in this case.  Also, supportive of a metastasis is CK7 negativity (primary ovarian mucinous carcinoma is typically CK7 positive).   There are rare cells with intracytoplasmic mucin  (mucicarmine stain).   The best IHC stain to differentiate between metastatic colorectal  adenocarcinoma and primary ovarian mucinous adenocarcinoma is SATB2. This marker has been ordered and will be performed at [facility].  The result will be reported in an addendum.   Furthermore, the tumor cells are negative for serous markers (p16, p53 (wildtype), WT-1) and negative for endometrioid markers (ER and vimentin).  The Ki-67 mitotic index is high.  The IHC stains and mucicarmine stain have satisfactory controls.    ADDENDUM:  The tumor was interrogated with SATB2 immunohistochemical (IHC) stain, performed at [facility].  The tumor has diffuse strong nuclear reactivity.  This result, in combination with the prior IHC results, is essentially diagnostic of a lower GI (colorectal) or appendiceal primary tumor.  The control is satisfactory.    03/28/2022 Procedure   Colonoscopy, Dr. Bryan Lemma  Impression: - Two 2 to 3 mm polyps in the sigmoid colon, removed with a cold snare. Resected and retrieved. - The remainder of the colon was normal, including retoflexed views of the right colon. - The examined portion of the ileum was normal. - Non-bleeding internal hemorrhoids.   04/01/2022 Initial Diagnosis   Adenocarcinoma (epithelial) of ovary, left ([place])   04/17/2022 Cancer Staging   Staging form: Ovary, Fallopian Tube, and Primary Peritoneal Carcinoma, AJCC 8th Edition - Clinical stage from 04/17/2022: FIGO Stage IIB (cT2b, cN0, cM0) - Signed by Truitt Merle, MD on 04/22/2022 Stage prefix: Initial diagnosis Histologic grade (G): GX Histologic grading system: 4 grade system   04/29/2022 -  Chemotherapy   Patient is  on Treatment Plan : COLORECTAL Xelox ([place]apeox)(130/850) q21d     04/30/2022 - 05/20/2022 Chemotherapy   Patient is on Treatment Plan : COLORECTAL Xelox (Capeox) q21d     07/09/2022 Genetic Testing   Negative hereditary cancer genetic testing: no pathogenic variants detected in [facility] CancerNextExpanded +RNAinsight Panel.  Report date is 07/09/2022.   The CancerNext-Expanded gene panel offered by [facility] and includes sequencing, rearrangement, and RNA analysis for the following 77 genes: AIP, ALK, APC, ATM, AXIN2, BAP1, BARD1, BLM, BMPR1A, BRCA1, BRCA2, BRIP1, CDC73, CDH1, CDK4, CDKN1B, CDKN2A, CHEK2, CTNNA1, DICER1, FANCC, FH, FLCN, GALNT12, KIF1B, LZTR1, MAX, MEN1, MET, MLH1, MSH2, MSH3, MSH6, MUTYH, NBN, NF1, NF2, NTHL1, PALB2, PHOX2B, PMS2, POT1, PRKAR1A, PTCH1, PTEN, RAD51C, RAD51D, RB1, RECQL, RET, SDHA, SDHAF2, SDHB, SDHC, SDHD, SMAD4, SMARCA4, SMARCB1, SMARCE1, STK11, SUFU, TMEM127, TP53, TSC1, TSC2, VHL and XRCC2 (sequencing and deletion/duplication); EGFR, EGLN1, HOXB13, KIT, MITF, PDGFRA, POLD1, and POLE (sequencing only); EPCAM and GREM1 (deletion/duplication only).   HRD testing is pending.        Interval history-: ALVIA JABLONSKI is a 61 y.o. female with oncologic history as above presenting to [facility] today with chief complaint of fever and cough x 3 days. T-max of 102 earlier this morning. She last took tylenol  at 9 am. She admits to productive cough with clear phlegm.  She also endorses fatigue.  She has had decreased appetite and fluid intake.  She had a negative home COVID test this morning.  She denies any urinary symptoms.  Patient states her husband and daughter had similar symptoms last week.  Her daughter had negative COVID and flu tests.       ROS  All other systems are reviewed and are negative for acute change except as noted in the HPI.    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   BMI 35.0-35.9,adult    Cancer ([place]) 03/12/2022   colon adenoca on ovary   Gall stone    GERD (gastroesophageal reflux disease)    Normal spontaneous vaginal delivery    2   Pneumonia      Past Surgical History:  Procedure Laterality Date   BREAST BIOPSY Left 07/15/2017   2 masses   BUNIONECTOMY  08/2021   COLONOSCOPY N/A 06/09/2014   Procedure: COLONOSCOPY;  Surgeon: Rogene Houston, MD;  Location: [facility];  Service: Endoscopy;  Laterality: N/A;  830-moved to 945 Ann to notify pt   COLONOSCOPY  03/28/2022   HYSTEROTOMY     LAPAROSCOPIC TOTAL HYSTERECTOMY  07/18/2010   RSO   NECK SURGERY  11/2012   c6 c7   ROBOTIC ASSISTED SALPINGO OOPHERECTOMY Left 03/12/2022   Procedure: XI ROBOTIC ASSISTED LEFT SALPINGO OOPHORECTOMY WITH STAGING;  Surgeon: Lafonda Mosses, MD;  Location: [facility];  Service: Gynecology;  Laterality: Left;    Social History   Socioeconomic History   Marital status: Married    Spouse name: Not on file   Number of children: 2   Years of education: Not on file   Highest education level: Not on file  Occupational History   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   Occupation: retired  Tobacco Use   Smoking status: Never   Smokeless tobacco: Never  Scientific laboratory technician Use: Never used  Substance and Sexual Activity   Alcohol use: Not Currently    Comment: 2-3 times/month   Drug use: No   Sexual activity: Yes    Birth control/protection: Surgical  Other Topics Concern   Not on file  Social History Narrative   Not on file   Social Determinants of Health   Financial Resource Strain: Not on file  Food Insecurity: Not on file  Transportation Needs: Not on file  Physical Activity: Not on file  Stress: Not on file  Social Connections: Not on file  Intimate Partner Violence: Not on file    Family History  Problem Relation Age of  Onset   Heart disease Mother    Prostate cancer Father 70   Breast cancer Sister 53       metastatic   Diabetes Brother    Prostate cancer Brother        dx after age 61; x2 brothers   Breast cancer Cousin        mat female cousin; dx 22s   Colon cancer Neg Hx    Ovarian cancer Neg Hx    Endometrial cancer Neg Hx    Pancreatic cancer Neg Hx    Stomach cancer Neg Hx    Esophageal cancer Neg Hx  Current Outpatient Medications:    BIOTIN PO, Take 4 tablets by mouth daily., Disp: , Rfl:    capecitabine (XELODA) 500 MG tablet, Take 3 tablets (1500 mg total) by mouth in morning and 4 tablets (2000 mg total) by mouth in evening every 12 hours. Take for 14 days then off for 7 days. Take within 30 minutes after meals., Disp: 98 tablet, Rfl: 1   Cholecalciferol (VITAMIN D3 PO), Take 1 tablet by mouth daily., Disp: , Rfl:    famotidine (PEPCID) 20 MG tablet, Take 1 tablet (20 mg total) by mouth 2 (two) times daily., Disp: 60 tablet, Rfl: 5   ibuprofen (ADVIL) 800 MG tablet, Take 1 tablet (800 mg total) by mouth every 8 (eight) hours as needed for moderate pain. For AFTER surgery only, Disp: 30 tablet, Rfl: 0   loratadine (CLARITIN) 10 MG tablet, Take 10 mg by mouth daily., Disp: , Rfl:    ondansetron (ZOFRAN) 8 MG tablet, Take by mouth., Disp: , Rfl:    rosuvastatin (CRESTOR) 5 MG tablet, TAKE ONE TABLET BY MOUTH ON MONDAYS, WEDNESDAYS, AND FRIDAYS FOR CHOLESTEROL, Disp: 36 tablet, Rfl: 1   senna-docusate (SENOKOT-S) 8.6-50 MG tablet, Take 2 tablets by mouth at bedtime. For AFTER surgery, do not take if having diarrhea, Disp: 30 tablet, Rfl: 0   venlafaxine XR (EFFEXOR-XR) 75 MG 24 hr capsule, Take 1 capsule (75 mg total) by mouth daily with breakfast., Disp: 30 capsule, Rfl: 1   vitamin C (ASCORBIC ACID) 500 MG tablet, Take 1,000 mg by mouth at bedtime., Disp: , Rfl:   PHYSICAL EXAM: ECOG FS:1 - Symptomatic but completely ambulatory    Vitals:   07/25/22 1500 07/25/22 1515 07/25/22  1553  BP: (!) 149/74    Pulse: (!) 119 (!) 117 (!) 111  Resp: 19    Temp: 97.8 F (36.6 C) 99.1 F (37.3 C) 99.1 F (37.3 C)  TempSrc: Temporal Oral Oral  SpO2: 96%  97%  Weight: 207 lb 11.2 oz (94.2 kg)    Height: _0  (1.651 m)     Physical Exam Vitals and nursing note reviewed.  Constitutional:      Appearance: She is well-developed. She is not ill-appearing or toxic-appearing.  HENT:     Head: Normocephalic.     Nose: Nose normal.     Mouth/Throat:     Mouth: Mucous membranes are dry.  Eyes:     Conjunctiva/sclera: Conjunctivae normal.  Neck:     Vascular: No JVD.  Cardiovascular:     Rate and Rhythm: Regular rhythm. Tachycardia present.     Pulses: Normal pulses.     Heart sounds: Normal heart sounds.  Pulmonary:     Effort: Pulmonary effort is normal. No respiratory distress.     Breath sounds: Normal breath sounds. No stridor. No wheezing, rhonchi or rales.  Chest:     Chest wall: No tenderness.  Abdominal:     General: There is no distension.  Musculoskeletal:     Cervical back: Normal range of motion.  Skin:    General: Skin is warm and dry.  Neurological:     Mental Status: She is oriented to person, place, and time.        LABORATORY DATA: I have reviewed the data as listed    Latest Ref Rng & Units 07/25/2022    2:20 PM 07/01/2022    1:16 PM 06/19/2022    7:33 AM  CBC  WBC 4.0 - 10.5 K/uL 5.2  4.6  6.9   Hemoglobin 12.0 - 15.0 g/dL 14.5  13.7  14.7   Hematocrit 36.0 - 46.0 % 41.9  40.1  42.2   Platelets 150 - 400 K/uL 146  183  126         Latest Ref Rng & Units 07/25/2022    2:20 PM 07/01/2022    1:16 PM 06/19/2022    7:33 AM  CMP  Glucose 70 - 99 mg/dL 104  112  126   BUN 6 - 20 mg/dL _0 Creatinine 0.44 - 1.00 mg/dL 0.70  0.70  0.71   Sodium 135 - 145 mmol/L 138  142  143   Potassium 3.5 - 5.1 mmol/L 3.9  3.8  3.9   Chloride 98 - 111 mmol/L 104  108  110   CO2 22 - 32 mmol/L 28  32  28   Calcium 8.9 - 10.3 mg/dL 9.8  9.2   9.8   Total Protein 6.5 - 8.1 g/dL 7.4  7.0  7.1   Total Bilirubin 0.3 - 1.2 mg/dL 0.9  0.7  0.6   Alkaline Phos 38 - 126 U/L 111  104  86   AST 15 - 41 U/L 90  149  81   ALT 0 - 44 U/L 131  191  117        RADIOGRAPHIC STUDIES (from last 24 hours if applicable) I have personally reviewed the radiological images as listed and agreed with the findings in the report. DG Chest 2 View  Result Date: 07/25/2022 CLINICAL DATA:  Fever, chest congestion, history of left ovarian carcinoma EXAM: CHEST - 2 VIEW COMPARISON:  12/05/2021 FINDINGS: The heart size and mediastinal contours are within normal limits. Both lungs are clear. There is previous surgical fusion in lower cervical spine. IMPRESSION: No active cardiopulmonary disease. Electronically Signed   By: Elmer Picker M.D.   On: 07/25/2022 15:09        Visit Diagnosis: 1. Fever, unspecified fever cause   2. Chest congestion   3. Adenocarcinoma (epithelial) of ovary, left ([facility])      No orders of the defined types were placed in this encounter.   All questions were answered. The patient knows to call the clinic with any problems, questions or concerns. No barriers to learning was detected.  I have spent a total of 20 minutes minutes of face-to-face and non-face-to-face time, preparing to see the patient, obtaining and/or reviewing separately obtained history, performing a medically appropriate examination, counseling and educating the patient, ordering tests, documenting clinical information in the electronic health record, and care coordination (communications with other health care professionals or caregivers).    Thank you for allowing me to participate in the care of this patient.    Barrie Folk, PA-C Department of Hematology/Oncology [facility] Phone: [phone]  [phone]    07/25/2022 4:41 PM

## 2022-07-25 NOTE — Telephone Encounter (Signed)
Spoke with pt and spouse Elta Guadeloupe) via telephone regarding our [place] conversation about pt's elevated temperature.  Pt was able to do VS while this RN was on phone with them.  Pt's HR 117, SATS 94% RA, BP 146/82, and 0/10.  Pt denied ear discomfort, pressure around her eyes and nose, diarrhea, and burning with urination.  Pt stated her eyes are burning.  Pt was positive for wheezing while one the phone with this RN.  Pt positive for aggressive/strong productive cough.  Pt stated sputum was light yellow in color and denied blood or pink tinge to sputum.  Pt's last chemo tx 07/02/2022.  Pt stated she does not want to go to the ED in [place].  Recommended again that the pt does a home Covid test to rule out Covid.  Elta Guadeloupe stated they will do the home Covid test and contact this RN with the results.  While waiting for pt's Covid results, spoke with Anda Kraft in [facility] regarding pt's symptoms.  Anda Kraft agreed to see pt in [facility] for further evaluation.

## 2022-07-26 ENCOUNTER — Inpatient Hospital Stay: Payer: BC Managed Care – PPO

## 2022-07-26 VITALS — BP 140/63 | HR 106 | Temp 99.1°F | Resp 18 | Wt 208.7 lb

## 2022-07-26 DIAGNOSIS — C562 Malignant neoplasm of left ovary: Secondary | ICD-10-CM | POA: Diagnosis not present

## 2022-07-26 DIAGNOSIS — R0989 Other specified symptoms and signs involving the circulatory and respiratory systems: Secondary | ICD-10-CM

## 2022-07-26 LAB — BLOOD CULTURE ID PANEL (REFLEXED) - BCID2

## 2022-07-26 MED ORDER — SODIUM CHLORIDE 0.9 % IV SOLN: Freq: Once | INTRAVENOUS | Status: AC

## 2022-07-26 NOTE — Patient Instructions (Signed)
Rehydration, Adult Rehydration is the replacement of body fluids, salts, and minerals (electrolytes) that are lost during dehydration. Dehydration is when there is not enough water or other fluids in the body. This happens when you lose more fluids than you take in. Common causes of dehydration include: Not drinking enough fluids. This can occur when you are ill or doing activities that require a lot of energy, especially in hot weather. Conditions that cause loss of water or other fluids, such as diarrhea, vomiting, sweating, or urinating a lot. Other illnesses, such as fever or infection. Certain medicines, such as those that remove excess fluid from the body (diuretics). Symptoms of mild or moderate dehydration may include thirst, dry lips and mouth, and dizziness. Symptoms of severe dehydration may include increased heart rate, confusion, fainting, and not urinating. For severe dehydration, you may need to get fluids through an IV at the hospital. For mild or moderate dehydration, you can usually rehydrate at home by drinking certain fluids as told by your health care provider. What are the risks? Generally, rehydration is safe. However, taking in too much fluid (overhydration) can be a problem. This is rare. Overhydration can cause an electrolyte imbalance, kidney failure, or a decrease in salt (sodium) levels in the body. Supplies needed You will need an oral rehydration solution (ORS) if your health care provider tells you to use one. This is a drink to treat dehydration. It can be found in pharmacies and retail stores. How to rehydrate Fluids Follow instructions from your health care provider for rehydration. The kind of fluid and the amount you should drink depend on your condition. In general, you should choose drinks that you prefer. If told by your health care provider, drink an ORS. Make an ORS by following instructions on the package. Start by drinking small amounts, about  cup (120  mL) every 5-10 minutes. Slowly increase how much you drink until you have taken the amount recommended by your health care provider. Drink enough clear fluids to keep your urine pale yellow. If you were told to drink an ORS, finish it first, then start slowly drinking other clear fluids. Drink fluids such as: Water. This includes sparkling water and flavored water. Drinking only water can lead to having too little sodium in your body (hyponatremia). Follow the advice of your health care provider. Water from ice chips you suck on. Fruit juice with water you add to it (diluted). Sports drinks. Hot or cold herbal teas. Broth-based soups. Milk or milk products. Food Follow instructions from your health care provider about what to eat while you rehydrate. Your health care provider may recommend that you slowly begin eating regular foods in small amounts. Eat foods that contain a healthy balance of electrolytes, such as bananas, oranges, potatoes, tomatoes, and spinach. Avoid foods that are greasy or contain a lot of sugar. In some cases, you may get nutrition through a feeding tube that is passed through your nose and into your stomach (nasogastric tube, or NG tube). This may be done if you have uncontrolled vomiting or diarrhea. Beverages to avoid  Certain beverages may make dehydration worse. While you rehydrate, avoid drinking alcohol. How to tell if you are recovering from dehydration You may be recovering from dehydration if: You are urinating more often than before you started rehydrating. Your urine is pale yellow. Your energy level improves. You vomit less frequently. You have diarrhea less frequently. Your appetite improves or returns to normal. You feel less dizzy or less light-headed.   Your skin tone and color start to look more normal. Follow these instructions at home: Take over-the-counter and prescription medicines only as told by your health care provider. Do not take sodium  tablets. Doing this can lead to having too much sodium in your body (hypernatremia). Contact a health care provider if: You continue to have symptoms of mild or moderate dehydration, such as: Thirst. Dry lips. Slightly dry mouth. Dizziness. Dark urine or less urine than normal. Muscle cramps. You continue to vomit or have diarrhea. Get help right away if you: Have symptoms of dehydration that get worse. Have a fever. Have a severe headache. Have been vomiting and the following happens: Your vomiting gets worse or does not go away. Your vomit includes blood or green matter (bile). You cannot eat or drink without vomiting. Have problems with urination or bowel movements, such as: Diarrhea that gets worse or does not go away. Blood in your stool (feces). This may cause stool to look black and tarry. Not urinating, or urinating only a small amount of very dark urine, within 6-8 hours. Have trouble breathing. Have symptoms that get worse with treatment. These symptoms may represent a serious problem that is an emergency. Do not wait to see if the symptoms will go away. Get medical help right away. Call your local emergency services (911 in the [place] Do not drive yourself to the hospital. Summary Rehydration is the replacement of body fluids and minerals (electrolytes) that are lost during dehydration. Follow instructions from your health care provider for rehydration. The kind of fluid and amount you should drink depend on your condition. Slowly increase how much you drink until you have taken the amount recommended by your health care provider. Contact your health care provider if you continue to show signs of mild or moderate dehydration. This information is not intended to replace advice given to you by your health care provider. Make sure you discuss any questions you have with your health care provider. Document Revised: 11/24/2019 Document Reviewed: 10/04/2019 Elsevier Patient  Education  2023 [employer]  

## 2022-07-27 LAB — URINE CULTURE: Culture: 20000 — AB

## 2022-07-28 LAB — CULTURE, BLOOD (ROUTINE X 2)

## 2022-07-28 NOTE — Telephone Encounter (Signed)
Sorry you are sick. Glad you are starting to feel some better As we discussed please send me an update tomorrow how you are doing I have sent Dr. Lindi Adie a staff message regarding your blood culture.  Hopefully they will reach out to you tomorrow with their thoughts and send [place] their thoughts as well.  Talk to you soon [facility]-Dr. Nicki Reaper

## 2022-07-29 ENCOUNTER — Encounter: Payer: Self-pay | Admitting: Family Medicine

## 2022-07-29 ENCOUNTER — Telehealth: Payer: Self-pay

## 2022-07-29 ENCOUNTER — Other Ambulatory Visit: Payer: Self-pay

## 2022-07-29 DIAGNOSIS — R509 Fever, unspecified: Secondary | ICD-10-CM

## 2022-07-29 NOTE — Telephone Encounter (Signed)
Called patient per Neysa Hotter, PA request to notify patient of positive blood culture results. Patient was advised that these results were most likely contaminated and will need to be redrawn as soon as possible to confirm results. Patient states she can come in tomorrow, 10/24 at [place] for lab check. Patient denies fever, worsening symptoms and chills.

## 2022-07-29 NOTE — Addendum Note (Signed)
Addended by: Thyra Breed E on: 07/29/2022 10:41 AM   Modules accepted: Orders

## 2022-07-29 NOTE — Progress Notes (Signed)
Orders entered for blood culture recheck.

## 2022-07-30 ENCOUNTER — Other Ambulatory Visit: Payer: Self-pay

## 2022-07-30 ENCOUNTER — Inpatient Hospital Stay: Payer: BC Managed Care – PPO

## 2022-07-30 DIAGNOSIS — C562 Malignant neoplasm of left ovary: Secondary | ICD-10-CM

## 2022-07-30 DIAGNOSIS — R509 Fever, unspecified: Secondary | ICD-10-CM

## 2022-07-30 LAB — CMP (CANCER CENTER ONLY)
ALT: 72 U/L — ABNORMAL HIGH (ref 0–44)
AST: 43 U/L — ABNORMAL HIGH (ref 15–41)
Albumin: 3.9 g/dL (ref 3.5–5.0)
Alkaline Phosphatase: 88 U/L (ref 38–126)
Anion gap: 7 (ref 5–15)
BUN: 9 mg/dL (ref 6–20)
CO2: 26 mmol/L (ref 22–32)
Calcium: 9.2 mg/dL (ref 8.9–10.3)
Chloride: 108 mmol/L (ref 98–111)
Creatinine: 0.64 mg/dL (ref 0.44–1.00)
GFR, Estimated: >60 mL/min (ref >60)
Glucose, Bld: 103 mg/dL — ABNORMAL HIGH (ref 70–99)
Potassium: 3.7 mmol/L (ref 3.5–5.1)
Sodium: 141 mmol/L (ref 135–145)
Total Bilirubin: 0.7 mg/dL (ref 0.3–1.2)
Total Protein: 7 g/dL (ref 6.5–8.1)

## 2022-07-30 LAB — CBC WITH DIFFERENTIAL (CANCER CENTER ONLY)
Abs Immature Granulocytes: 0 10*3/uL (ref 0.00–0.07)
Basophils Absolute: 0 10*3/uL (ref 0.0–0.1)
Basophils Relative: 1 %
Eosinophils Absolute: 0.3 10*3/uL (ref 0.0–0.5)
Eosinophils Relative: 9 %
HCT: 37.2 % (ref 36.0–46.0)
Hemoglobin: 13.1 g/dL (ref 12.0–15.0)
Immature Granulocytes: 0 %
Lymphocytes Relative: 43 %
Lymphs Abs: 1.6 10*3/uL (ref 0.7–4.0)
MCH: 32 pg (ref 26.0–34.0)
MCHC: 35.2 g/dL (ref 30.0–36.0)
MCV: 91 fL (ref 80.0–100.0)
Monocytes Absolute: 0.4 10*3/uL (ref 0.1–1.0)
Monocytes Relative: 12 %
Neutro Abs: 1.3 10*3/uL — ABNORMAL LOW (ref 1.7–7.7)
Neutrophils Relative %: 35 %
Platelet Count: 176 10*3/uL (ref 150–400)
RBC: 4.09 MIL/uL (ref 3.87–5.11)
RDW: 17.8 % — ABNORMAL HIGH (ref 11.5–15.5)
WBC Count: 3.7 10*3/uL — ABNORMAL LOW (ref 4.0–10.5)
nRBC: 0 % (ref 0.0–0.2)

## 2022-07-30 LAB — CULTURE, BLOOD (ROUTINE X 2)
Culture: NO GROWTH
Special Requests: ADEQUATE

## 2022-07-31 ENCOUNTER — Inpatient Hospital Stay: Payer: BC Managed Care – PPO

## 2022-07-31 ENCOUNTER — Encounter: Payer: Self-pay | Admitting: Hematology

## 2022-07-31 ENCOUNTER — Inpatient Hospital Stay: Payer: BC Managed Care – PPO | Admitting: Hematology

## 2022-07-31 ENCOUNTER — Other Ambulatory Visit: Payer: Self-pay

## 2022-07-31 VITALS — BP 151/86 | HR 93 | Temp 98.5°F | Resp 18 | Ht 65.0 in | Wt 211.9 lb

## 2022-07-31 DIAGNOSIS — C562 Malignant neoplasm of left ovary: Secondary | ICD-10-CM | POA: Diagnosis not present

## 2022-07-31 NOTE — Progress Notes (Signed)
[place]   [phone] Fax:([phone]) [phone]   Clinic Follow up Note   Patient Care Team: Kathyrn Drown, MD as PCP - General (Family Medicine) Terrance Mass, MD (Inactive) as Consulting Physician (Obstetrics and Gynecology) Jovita Gamma, MD as Consulting Physician (Neurosurgery) Rogene Houston, MD as Consulting Physician (Gastroenterology) Truitt Merle, MD as Consulting Physician (Oncology)  Date of Service:  07/31/2022  CHIEF COMPLAINT: f/u of ovarian adenocarcinoma  CURRENT THERAPY:  Surveillance  ASSESSMENT & PLAN:  [name] is a 61 y.o. female with   1. Mucinous adenocarcinoma of left ovary, Stage IIB -presented with acute left abdominal pain. CT [facility] on 12/13/21 in ED showed a 6.6 cm hemorrhagic mass. Baseline CA 125 from 01/03/22 was WNL at 9.7. her pain resolved and no recurrent or other symptoms -S/p left salpingo-oophorectomy on 03/12/22 with Dr. Berline Lopes. 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 I spoke with pathologist Dr. Vic Ripper, he agrees with the diagnosis but pointed out that 2% ovarian cancer is intestinal type and IHC will be similar to low GI primary.  -colonoscopy on 03/28/22 with Dr. Bryan Lemma showed no suspicious mass, only polyps.  -baseline CEA on 04/01/22 was WNL.  -PET scan on 04/10/22 showed: no convincing evidence of hypermetabolic disease; postsurgical hypermetabolism in left adnexa and abdominal wall. -enterography on 04/15/22 showed: no small bowel masses or nodularity or tumor along omentum or mesentery; no definite appendiceal tumor. -she completed 4 cycles adjuvant CAPEOX 04/30/22 - 07/02/22 and one additional cycle of Xeloda alone.   -she is clinically doing well from a cancer standpoint. Labs from yesterday reviewed, ANC 1.3, otherwise stable to improved. Plan for restaging CT in 3 months; I ordered today.   2. URI, positive blood culture  -she  developed fever and productive cough around 07/23/22. -she was seen in [facility] on 07/25/22, chest x-ray was negative but blood culture was positive for staph infection -repeat blood culture performed yesterday, 10/24, is negative so far. -her fever is gone, but she reports persistent productive cough with green sputum.   3. Genetics  -labs drawn 07/01/22, results were negative.     PLAN:  -will contact them with final blood culture results -f/u in 3 months, with lab and CT several days before   No problem-specific Assessment & Plan notes found for this encounter.   SUMMARY OF ONCOLOGIC HISTORY: Oncology History  Adenocarcinoma (epithelial) of ovary, left ([place])  12/13/2021 Imaging   EXAM: CT ABDOMEN AND PELVIS WITH CONTRAST  IMPRESSION: 1. 6.6 cm hyperdense left adnexal/ovarian mass, likely a hemorrhagic cyst/mass. Recommend follow-up pelvic ultrasound. 2. 1.5 cm hyperenhancing lesion in the right hepatic lobe. Most likely differentials include hemangioma, FNH, adenoma. Consider nonemergent follow-up MRI abdomen with contrast.   01/03/2022 Tumor Marker   CA 125: 9.7 (WNL)   01/14/2022 Imaging   EXAM: MRI ABDOMEN WITHOUT AND WITH CONTRAST  IMPRESSION: Enhancing 13 mm segment VI hepatic lesion measuring 13 mm demonstrates nonspecific imaging characteristics but which is favored to reflect a benign etiology such as focal nodular hyperplasia or a hepatic adenoma. Recommend follow-up MRI in 6 months with and without EOVIST contrast for more definitive characterization and to assess stability.   03/12/2022 Pathology Results   FINAL MICROSCOPIC DIAGNOSIS:   A.   FALLOPIAN TUBE AND OVARY, LEFT, SALPINGO OOPHORECTOMY:  -    Adenocarcinoma, most consistent with metastatic colorectal  adenocarcinoma, see Comment.  -    Fallopian tube, negative for  malignancy/intraepithelial carcinoma (STIC).   B.   PELVIC SIDEWALL, RIGHT, BIOPSY:  -    Benign, fibroelastic nodule.  -    Negative for  malignancy.   C.   CUL DE SAC, POSTERIOR, BIOPSY:  -    Negative for malignancy.   D.   CUL DE SAC, ANTERIOR, BIOPSY:  -    Negative for malignancy.   E.   PELVIC SIDEWALL, LEFT, BIOPSY:  -    Negative for malignancy.   F.   PARACOLIC GUTTER, LEFT, BIOPSY:  -    Negative for malignancy.   G.   PARACOLIC GUTTER, RIGHT, BIOPSY:  -    Negative for malignancy.   H.   OMENTUM:  -    Negative for malignancy.   I.   OVARY REMNANT, LEFT, EXCISION:  -    Adenocarcinoma, most consistent with metastatic colorectal  adenocarcinoma.   COMMENT:  The tumor was interrogated with immunohistochemical (IHC) stains.  The tumor cells are diffusely and strongly positive for CDX-2, CK20 and mCEA and the tumor cells are CK7 negative.   This immunoprofile strongly favors metastatic colorectal adenocarcinoma.   The tumor cells are PAX8 negative to focally equivocal, a finding which is non-contributory. Primary ovarian mucinous adenocarcinoma can be CK20, CDX-2 and mCEA positive; although the staining pattern is typically focal and not diffuse, as in this case.  Also, supportive of a metastasis is CK7 negativity (primary ovarian mucinous carcinoma is typically CK7 positive).   There are rare cells with intracytoplasmic mucin  (mucicarmine stain).   The best IHC stain to differentiate between metastatic colorectal  adenocarcinoma and primary ovarian mucinous adenocarcinoma is SATB2. This marker has been ordered and will be performed at [facility].  The result will be reported in an addendum.   Furthermore, the tumor cells are negative for serous markers (p16, p53 (wildtype), WT-1) and negative for endometrioid markers (ER and vimentin).  The Ki-67 mitotic index is high.  The IHC stains and mucicarmine stain have satisfactory controls.    ADDENDUM:  The tumor was interrogated with SATB2 immunohistochemical (IHC) stain, performed at [facility].  The tumor has diffuse strong nuclear reactivity.  This result,  in combination with the prior IHC results, is essentially diagnostic of a lower GI (colorectal) or appendiceal primary tumor.  The control is satisfactory.    03/28/2022 Procedure   Colonoscopy, Dr. Bryan Lemma  Impression: - Two 2 to 3 mm polyps in the sigmoid colon, removed with a cold snare. Resected and retrieved. - The remainder of the colon was normal, including retoflexed views of the right colon. - The examined portion of the ileum was normal. - Non-bleeding internal hemorrhoids.   04/01/2022 Initial Diagnosis   Adenocarcinoma (epithelial) of ovary, left ([place])   04/17/2022 Cancer Staging   Staging form: Ovary, Fallopian Tube, and Primary Peritoneal Carcinoma, AJCC 8th Edition - Clinical stage from 04/17/2022: FIGO Stage IIB (cT2b, cN0, cM0) - Signed by Truitt Merle, MD on 04/22/2022 Stage prefix: Initial diagnosis Histologic grade (G): GX Histologic grading system: 4 grade system   04/29/2022 -  Chemotherapy   Patient is on Treatment Plan : COLORECTAL Xelox ([place]ox)(130/850) q21d     04/30/2022 - 05/20/2022 Chemotherapy   Patient is on Treatment Plan : COLORECTAL Xelox (Capeox) q21d     07/09/2022 Genetic Testing   Negative hereditary cancer genetic testing: no pathogenic variants detected in [facility] CancerNextExpanded +RNAinsight Panel.  Report date is 07/09/2022.   The CancerNext-Expanded gene panel offered by [facility] and includes sequencing,  rearrangement, and RNA analysis for the following 77 genes: AIP, ALK, APC, ATM, AXIN2, BAP1, BARD1, BLM, BMPR1A, BRCA1, BRCA2, BRIP1, CDC73, CDH1, CDK4, CDKN1B, CDKN2A, CHEK2, CTNNA1, DICER1, FANCC, FH, FLCN, GALNT12, KIF1B, LZTR1, MAX, MEN1, MET, MLH1, MSH2, MSH3, MSH6, MUTYH, NBN, NF1, NF2, NTHL1, PALB2, PHOX2B, PMS2, POT1, PRKAR1A, PTCH1, PTEN, RAD51C, RAD51D, RB1, RECQL, RET, SDHA, SDHAF2, SDHB, SDHC, SDHD, SMAD4, SMARCA4, SMARCB1, SMARCE1, STK11, SUFU, TMEM127, TP53, TSC1, TSC2, VHL and XRCC2 (sequencing and deletion/duplication); EGFR,  EGLN1, HOXB13, KIT, MITF, PDGFRA, POLD1, and POLE (sequencing only); EPCAM and GREM1 (deletion/duplication only).   HRD testing is pending.       INTERVAL HISTORY:  [name] is here for a follow up of ovarian adenocarcinoma. She was last seen by me on 07/01/22 with visit in our [facility] in the interim. She presents to the clinic accompanied by her husband. She reports she continues to have a bothersome cough with green mucous production. She denies post-nasal drip but notes some bloody nasal excretion.   All other systems were reviewed with the patient and are negative.  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   BMI 35.0-35.9,adult    Cancer ([place]) 03/12/2022   colon adenoca on ovary   Gall stone    GERD (gastroesophageal reflux disease)    Normal spontaneous vaginal delivery    2   Pneumonia     SURGICAL HISTORY: Past Surgical History:  Procedure Laterality Date   BREAST BIOPSY Left 07/15/2017   2 masses   BUNIONECTOMY  08/2021   COLONOSCOPY N/A 06/09/2014   Procedure: COLONOSCOPY;  Surgeon: Rogene Houston, MD;  Location: [facility];  Service: Endoscopy;  Laterality: N/A;  830-moved to 945 Ann to notify pt   COLONOSCOPY  03/28/2022   HYSTEROTOMY     LAPAROSCOPIC TOTAL HYSTERECTOMY  07/18/2010   RSO   NECK SURGERY  11/2012   c6 c7   ROBOTIC ASSISTED SALPINGO OOPHERECTOMY Left 03/12/2022   Procedure: XI ROBOTIC ASSISTED LEFT SALPINGO OOPHORECTOMY WITH STAGING;  Surgeon: Lafonda Mosses, MD;  Location: [facility];  Service: Gynecology;  Laterality: Left;    I have reviewed the social history and family history with the patient and they are unchanged from previous note.  ALLERGIES:  is allergic to wound dressing adhesive.  MEDICATIONS:  Current Outpatient Medications  Medication Sig Dispense Refill   BIOTIN PO Take 4 tablets by mouth daily.      Cholecalciferol (VITAMIN D3 PO) Take 1 tablet by mouth daily.     famotidine (PEPCID) 20 MG tablet Take 1 tablet (20 mg total) by mouth 2 (two) times daily. 60 tablet 5   ibuprofen (ADVIL) 800 MG tablet Take 1 tablet (800 mg total) by mouth every 8 (eight) hours as needed for moderate pain. For AFTER surgery only 30 tablet 0   loratadine (CLARITIN) 10 MG tablet Take 10 mg by mouth daily.     ondansetron (ZOFRAN) 8 MG tablet Take by mouth.     rosuvastatin (CRESTOR) 5 MG tablet TAKE ONE TABLET BY MOUTH ON MONDAYS, WEDNESDAYS, AND FRIDAYS FOR CHOLESTEROL 36 tablet 1   senna-docusate (SENOKOT-S) 8.6-50 MG tablet Take 2 tablets by mouth at bedtime. For AFTER surgery, do not take if having diarrhea 30 tablet 0   venlafaxine XR (EFFEXOR-XR) 75 MG 24 hr capsule Take 1 capsule (75 mg total) by mouth daily with breakfast.  30 capsule 1   vitamin C (ASCORBIC ACID) 500 MG tablet Take 1,000 mg by mouth at bedtime.     No current facility-administered medications for this visit.    PHYSICAL EXAMINATION: ECOG PERFORMANCE STATUS: 1 - Symptomatic but completely ambulatory  Vitals:   07/31/22 0957  BP: (!) 151/86  Pulse: 93  Resp: 18  Temp: 98.5 F (36.9 C)  SpO2: 97%   Wt Readings from Last 3 Encounters:  07/31/22 211 lb 14.4 oz (96.1 kg)  07/26/22 208 lb 11.2 oz (94.7 kg)  07/25/22 207 lb 11.2 oz (94.2 kg)     GENERAL:alert, no distress and comfortable SKIN: skin color, texture, turgor are normal, no rashes or significant lesions EYES: normal, Conjunctiva are pink and non-injected, sclera clear  LUNGS: clear to auscultation and percussion with normal breathing effort HEART: regular rate & rhythm and no murmurs and no lower extremity edema ABDOMEN:abdomen soft, non-tender and normal bowel sounds Musculoskeletal:no cyanosis of digits and no clubbing  NEURO: alert & oriented x 3 with fluent speech, no focal motor/sensory deficits  LABORATORY DATA:  I have reviewed the data as listed    Latest  Ref Rng & Units 07/30/2022   10:18 AM 07/25/2022    2:20 PM 07/01/2022    1:16 PM  CBC  WBC 4.0 - 10.5 K/uL 3.7  5.2  4.6   Hemoglobin 12.0 - 15.0 g/dL 13.1  14.5  13.7   Hematocrit 36.0 - 46.0 % 37.2  41.9  40.1   Platelets 150 - 400 K/uL 176  146  183         Latest Ref Rng & Units 07/30/2022   10:18 AM 07/25/2022    2:20 PM 07/01/2022    1:16 PM  CMP  Glucose 70 - 99 mg/dL 103  104  112   BUN 6 - 20 mg/dL '9  11  8   ' Creatinine 0.44 - 1.00 mg/dL 0.64  0.70  0.70   Sodium 135 - 145 mmol/L 141  138  142   Potassium 3.5 - 5.1 mmol/L 3.7  3.9  3.8   Chloride 98 - 111 mmol/L 108  104  108   CO2 22 - 32 mmol/L 26  28  32   Calcium 8.9 - 10.3 mg/dL 9.2  9.8  9.2   Total Protein 6.5 - 8.1 g/dL 7.0  7.4  7.0   Total Bilirubin 0.3 - 1.2 mg/dL 0.7  0.9  0.7   Alkaline Phos 38 - 126 U/L 88  111  104   AST 15 - 41 U/L 43  90  149   ALT 0 - 44 U/L 72  131  191       RADIOGRAPHIC STUDIES: I have personally reviewed the radiological images as listed and agreed with the findings in the report. No results found.    Orders Placed This Encounter  Procedures   CT CHEST ABDOMEN PELVIS W CONTRAST    Standing Status:   Future    Standing Expiration Date:   08/01/2023    Order Specific Question:   Is patient pregnant?    Answer:   No    Order Specific Question:   Preferred imaging location?    Answer:   [facility]    Order Specific Question:   Release to patient    Answer:   Immediate    Order Specific Question:   Is Oral Contrast requested for this exam?    Answer:   Yes, Per  Radiology protocol   All questions were answered. The patient knows to call the clinic with any problems, questions or concerns. No barriers to learning was detected. The total time spent in the appointment was 30 minutes.     Truitt Merle, MD 07/31/2022   I, Wilburn Mylar, am acting as scribe for Truitt Merle, MD.   I have reviewed the above documentation for accuracy and completeness, and I agree  with the above.

## 2022-08-01 ENCOUNTER — Ambulatory Visit: Payer: Self-pay | Admitting: Genetic Counselor

## 2022-08-01 ENCOUNTER — Other Ambulatory Visit: Payer: Self-pay

## 2022-08-01 ENCOUNTER — Telehealth: Payer: Self-pay | Admitting: Genetic Counselor

## 2022-08-01 DIAGNOSIS — C562 Malignant neoplasm of left ovary: Secondary | ICD-10-CM

## 2022-08-01 DIAGNOSIS — Z1379 Encounter for other screening for genetic and chromosomal anomalies: Secondary | ICD-10-CM

## 2022-08-01 DIAGNOSIS — Z803 Family history of malignant neoplasm of breast: Secondary | ICD-10-CM

## 2022-08-01 DIAGNOSIS — Z8042 Family history of malignant neoplasm of prostate: Secondary | ICD-10-CM

## 2022-08-01 NOTE — Telephone Encounter (Signed)
Neg for BRCA1/2 mutations in tumor.  Unable to calculate GIS.  Disclosed to patient.

## 2022-08-04 LAB — CULTURE, BLOOD (ROUTINE X 2)
Culture: NO GROWTH
Culture: NO GROWTH
Special Requests: ADEQUATE
Special Requests: ADEQUATE

## 2022-08-06 ENCOUNTER — Encounter: Payer: Self-pay | Admitting: Hematology

## 2022-08-06 NOTE — Progress Notes (Signed)
HPI:   [name] was previously seen in the [place] clinic due to a personal history of adenocarcinoma of the ovary and concerns regarding a hereditary predisposition to cancer. Please refer to our prior cancer genetics clinic note for more information regarding our discussion, assessment and recommendations, at the time. [name] recent genetic test results were disclosed to her, as were recommendations warranted by these results. These results and recommendations are discussed in more detail below.  CANCER HISTORY:  Oncology History  Adenocarcinoma (epithelial) of ovary, left ([place])  12/13/2021 Imaging   EXAM: CT ABDOMEN AND PELVIS WITH CONTRAST  IMPRESSION: 1. 6.6 cm hyperdense left adnexal/ovarian mass, likely a hemorrhagic cyst/mass. Recommend follow-up pelvic ultrasound. 2. 1.5 cm hyperenhancing lesion in the right hepatic lobe. Most likely differentials include hemangioma, FNH, adenoma. Consider nonemergent follow-up MRI abdomen with contrast.   01/03/2022 Tumor Marker   CA 125: 9.7 (WNL)   01/14/2022 Imaging   EXAM: MRI ABDOMEN WITHOUT AND WITH CONTRAST  IMPRESSION: Enhancing 13 mm segment VI hepatic lesion measuring 13 mm demonstrates nonspecific imaging characteristics but which is favored to reflect a benign etiology such as focal nodular hyperplasia or a hepatic adenoma. Recommend follow-up MRI in 6 months with and without EOVIST contrast for more definitive characterization and to assess stability.   03/12/2022 Pathology Results   FINAL MICROSCOPIC DIAGNOSIS:   A.   FALLOPIAN TUBE AND OVARY, LEFT, SALPINGO OOPHORECTOMY:  -    Adenocarcinoma, most consistent with metastatic colorectal  adenocarcinoma, see Comment.  -    Fallopian tube, negative for malignancy/intraepithelial carcinoma (STIC).   B.   PELVIC SIDEWALL, RIGHT, BIOPSY:  -    Benign, fibroelastic nodule.  -    Negative for malignancy.   C.   CUL DE SAC, POSTERIOR, BIOPSY:  -     Negative for malignancy.   D.   CUL DE SAC, ANTERIOR, BIOPSY:  -    Negative for malignancy.   E.   PELVIC SIDEWALL, LEFT, BIOPSY:  -    Negative for malignancy.   F.   PARACOLIC GUTTER, LEFT, BIOPSY:  -    Negative for malignancy.   G.   PARACOLIC GUTTER, RIGHT, BIOPSY:  -    Negative for malignancy.   H.   OMENTUM:  -    Negative for malignancy.   I.   OVARY REMNANT, LEFT, EXCISION:  -    Adenocarcinoma, most consistent with metastatic colorectal  adenocarcinoma.   COMMENT:  The tumor was interrogated with immunohistochemical (IHC) stains.  The tumor cells are diffusely and strongly positive for CDX-2, CK20 and mCEA and the tumor cells are CK7 negative.   This immunoprofile strongly favors metastatic colorectal adenocarcinoma.   The tumor cells are PAX8 negative to focally equivocal, a finding which is non-contributory. Primary ovarian mucinous adenocarcinoma can be CK20, CDX-2 and mCEA positive; although the staining pattern is typically focal and not diffuse, as in this case.  Also, supportive of a metastasis is CK7 negativity (primary ovarian mucinous carcinoma is typically CK7 positive).   There are rare cells with intracytoplasmic mucin  (mucicarmine stain).   The best IHC stain to differentiate between metastatic colorectal  adenocarcinoma and primary ovarian mucinous adenocarcinoma is SATB2. This marker has been ordered and will be performed at [facility].  The result will be reported in an addendum.   Furthermore, the tumor cells are negative for serous markers (p16, p53 (wildtype), WT-1) and negative for endometrioid markers (ER and vimentin).  The Ki-67  mitotic index is high.  The IHC stains and mucicarmine stain have satisfactory controls.    ADDENDUM:  The tumor was interrogated with SATB2 immunohistochemical (IHC) stain, performed at [facility].  The tumor has diffuse strong nuclear reactivity.  This result, in combination with the prior IHC results, is essentially  diagnostic of a lower GI (colorectal) or appendiceal primary tumor.  The control is satisfactory.    03/28/2022 Procedure   Colonoscopy, Dr. Bryan Lemma  Impression: - Two 2 to 3 mm polyps in the sigmoid colon, removed with a cold snare. Resected and retrieved. - The remainder of the colon was normal, including retoflexed views of the right colon. - The examined portion of the ileum was normal. - Non-bleeding internal hemorrhoids.   04/01/2022 Initial Diagnosis   Adenocarcinoma (epithelial) of ovary, left ([place])   04/17/2022 Cancer Staging   Staging form: Ovary, Fallopian Tube, and Primary Peritoneal Carcinoma, AJCC 8th Edition - Clinical stage from 04/17/2022: FIGO Stage IIB (cT2b, cN0, cM0) - Signed by Truitt Merle, MD on 04/22/2022 Stage prefix: Initial diagnosis Histologic grade (G): GX Histologic grading system: 4 grade system   04/29/2022 -  Chemotherapy   Patient is on Treatment Plan : COLORECTAL Xelox ([place])(130/850) q21d     04/30/2022 - 05/20/2022 Chemotherapy   Patient is on Treatment Plan : COLORECTAL Xelox ([place]ox) q21d     07/09/2022 Genetic Testing   Negative hereditary cancer genetic testing: no pathogenic variants detected in [facility]RNAinsight Panel.  Report date is 07/09/2022.   The CancerNext-Expanded gene panel offered by [facility] and includes sequencing, rearrangement, and RNA analysis for the following 77 genes: AIP, ALK, APC, ATM, AXIN2, BAP1, BARD1, BLM, BMPR1A, BRCA1, BRCA2, BRIP1, CDC73, CDH1, CDK4, CDKN1B, CDKN2A, CHEK2, CTNNA1, DICER1, FANCC, FH, FLCN, GALNT12, KIF1B, LZTR1, MAX, MEN1, MET, MLH1, MSH2, MSH3, MSH6, MUTYH, NBN, NF1, NF2, NTHL1, PALB2, PHOX2B, PMS2, POT1, PRKAR1A, PTCH1, PTEN, RAD51C, RAD51D, RB1, RECQL, RET, SDHA, SDHAF2, SDHB, SDHC, SDHD, SMAD4, SMARCA4, SMARCB1, SMARCE1, STK11, SUFU, TMEM127, TP53, TSC1, TSC2, VHL and XRCC2 (sequencing and deletion/duplication); EGFR, EGLN1, HOXB13, KIT, MITF, PDGFRA, POLD1, and POLE (sequencing  only); EPCAM and GREM1 (deletion/duplication only).   HRD testing is pending.      FAMILY HISTORY:  We obtained a detailed, 4-generation family history.  Significant diagnoses are listed below:      Family History  Problem Relation Age of Onset   Prostate cancer Father 8   Breast cancer Sister 36        metastatic   Prostate cancer Brother          dx after age 63; x2 brothers   Breast cancer Cousin          mat female cousin; dx 43s         [name]'s sister had negative genetic testing in April 2022 through [facility].  The CustomNext-Cancer+RNAinsight panel offered by [facility] includes sequencing and rearrangement analysis for the following 47 genes:  APC, ATM, AXIN2, BARD1, BMPR1A, BRCA1, BRCA2, BRIP1, CDH1, CDK4, CDKN2A, CHEK2, DICER1, EPCAM, GREM1, HOXB13, MEN1, MLH1, MSH2, MSH3, MSH6, MUTYH, NBN, NF1, NF2, NTHL1, PALB2, PMS2, POLD1, POLE, PTEN, RAD51C, RAD51D, RECQL, RET, SDHA, SDHAF2, SDHB, SDHC, SDHD, SMAD4, SMARCA4, STK11, TP53, TSC1, TSC2, and VHL.  RNA data is routinely analyzed for use in variant interpretation for all genes.  Variants of uncertain significance were detected in PMS2 and RET.    [name] is unaware of other previous family history of genetic testing for hereditary cancer  risks. There is no reported Ashkenazi Jewish ancestry. There is no known consanguinity.   GENETIC TEST RESULTS:  The [facility] CancerNext-Expanded +RNAinsight Panel found no pathogenic mutations.   The CancerNext-Expanded gene panel offered by [facility] and includes sequencing, rearrangement, and RNA analysis for the following 77 genes: AIP, ALK, APC, ATM, AXIN2, BAP1, BARD1, BLM, BMPR1A, BRCA1, BRCA2, BRIP1, CDC73, CDH1, CDK4, CDKN1B, CDKN2A, CHEK2, CTNNA1, DICER1, FANCC, FH, FLCN, GALNT12, KIF1B, LZTR1, MAX, MEN1, MET, MLH1, MSH2, MSH3, MSH6, MUTYH, NBN, NF1, NF2, NTHL1, PALB2, PHOX2B, PMS2, POT1, PRKAR1A, PTCH1, PTEN, RAD51C, RAD51D, RB1, RECQL,  RET, SDHA, SDHAF2, SDHB, SDHC, SDHD, SMAD4, SMARCA4, SMARCB1, SMARCE1, STK11, SUFU, TMEM127, TP53, TSC1, TSC2, VHL and XRCC2 (sequencing and deletion/duplication); EGFR, EGLN1, HOXB13, KIT, MITF, PDGFRA, POLD1, and POLE (sequencing only); EPCAM and GREM1 (deletion/duplication only).   The test report has been scanned into EPIC and is located under the Molecular Pathology section of the Results Review tab.  A portion of the result report is included below for reference. Genetic testing reported out on July 09, 2022.     Even though a pathogenic variant was not identified, possible explanations for the cancer in the family may include: There may be no hereditary risk for cancer in the family. The cancers in [name] and/or her family may be sporadic/familial or due to other genetic and environmental factors. There may be a gene mutation in one of these genes that current testing methods cannot detect but that chance is small. There could be another gene that has not yet been discovered, or that we have not yet tested, that is responsible for the cancer diagnoses in the family.  It is also possible there is a hereditary cause for the cancer in the family that [name] did not inherit.  Therefore, it is important to remain in touch with cancer genetics in the future so that we can continue to offer [name] the most up to date germline genetic testing.   Somatic testing In addition to having germline genetic testing to analyze for inherited variants, [name] also had testing for homologous recombination deficiency (HRD) to indicate if [name] may benefit from PARP inhibitors.  [facility] includes sequencing and large rearrangement analysis of BRCA1/2 and genomic instability status through loss of heterozygosity (LOH), telomeric allelic imbalance (TAI) and large-scale state transitions (LST).   No somatic BRCA1/2 mutations were detected by [facility].  Genomic instability  score (GIS) was unable to be calculated.  Report date is July 29, 2022. 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 Tumors with genome-wide LOH suggest the presence of a DNA repair defect, but whether it is homologous recombination related remains unknown and thus, the GIS analysis is reported as inconclusive."   Per laboratory, additional sample analysis is not recommended as it is unlikely to produce a different result.     ADDITIONAL GENETIC TESTING:  We discussed with [name] that her genetic testing was fairly extensive.  If there are additional relevant genes identified to increase cancer risk that can be analyzed in the future, we would be happy to discuss and coordinate this testing at that time.    CANCER SCREENING RECOMMENDATIONS:  [name] test result is considered negative (normal).  This means that we have not identified a hereditary cause for her personal history of ovarian cancer at this time.  An individual's cancer risk and medical management are not determined by genetic test results alone. Overall cancer risk assessment incorporates additional factors, including personal medical history, family history, and any available genetic information that may result in a personalized plan for cancer prevention and surveillance. Therefore, it is recommended she continue to follow the cancer management and screening guidelines provided by her oncology and primary healthcare provider.  RECOMMENDATIONS FOR FAMILY MEMBERS:   Since she did not inherit a identifiable mutation in a cancer predisposition gene included on this panel, her daughters could not have inherited a known mutation from her in one of these genes. Individuals in this family might be at some increased risk of developing cancer, over the general population risk, due to the family  history of cancer.  Individuals in the family should notify their providers of the family history of cancer. We recommend women in this family have a yearly mammogram beginning at age 16, or 4 years younger than the earliest onset of cancer, an annual clinical breast exam, and perform monthly breast self-exams.  Female relatives should speak with their providers about prostate cancer screening.  Other members of the family may still carry a pathogenic variant in one of these genes that Ms. Banas did not inherit. Based on the family history, we recommend her brothers, who have a history of prostate cancer, have genetic counseling and testing. [name] will let us know if we can be of any assistance in coordinating genetic counseling and/or testing for these family members.     FOLLOW-UP:  Lastly, we discussed with [name] that cancer genetics is a rapidly advancing field and it is possible that new genetic tests will be appropriate for her and/or her family members in the future. We encouraged her to remain in contact with cancer genetics on an annual basis so we can update her personal and family histories and let her know of advances in cancer genetics that may benefit this family.   Our contact number was provided. [name] questions were answered to her satisfaction, and she knows she is welcome to call us at anytime with additional questions or concerns.   Saketh Daubert M. Joette Catching, [place], [facility] Genetic Counselor [email or web address] (P) [phone]

## 2022-08-27 ENCOUNTER — Encounter: Payer: Self-pay | Admitting: Hematology

## 2022-09-03 ENCOUNTER — Other Ambulatory Visit: Payer: Self-pay | Admitting: Family Medicine

## 2022-09-03 ENCOUNTER — Encounter: Payer: Self-pay | Admitting: Family Medicine

## 2022-09-03 MED ORDER — CEPHALEXIN 500 MG PO CAPS: ORAL_CAPSULE | ORAL | 0 refills | Status: DC

## 2022-09-20 ENCOUNTER — Encounter (HOSPITAL_COMMUNITY): Payer: Self-pay

## 2022-10-08 ENCOUNTER — Other Ambulatory Visit: Payer: Self-pay | Admitting: Family Medicine

## 2022-10-09 NOTE — Telephone Encounter (Signed)
Nurses-please talk with patient previously patient was on famotidine and this medicine was discontinued now that we are receiving this particular medicine find out from the patient if she going to be on famotidine or is she going to be on pantoprazole?

## 2022-10-10 ENCOUNTER — Other Ambulatory Visit: Payer: Self-pay | Admitting: Family Medicine

## 2022-10-10 DIAGNOSIS — Z1231 Encounter for screening mammogram for malignant neoplasm of breast: Secondary | ICD-10-CM

## 2022-10-11 ENCOUNTER — Other Ambulatory Visit: Payer: Self-pay

## 2022-10-11 NOTE — Telephone Encounter (Signed)
MyChart message sent to patient.

## 2022-10-25 ENCOUNTER — Encounter: Payer: Self-pay | Admitting: Nurse Practitioner

## 2022-10-28 ENCOUNTER — Inpatient Hospital Stay: Payer: BC Managed Care – PPO | Attending: Gynecologic Oncology

## 2022-10-28 ENCOUNTER — Other Ambulatory Visit: Payer: Self-pay

## 2022-10-28 ENCOUNTER — Ambulatory Visit (HOSPITAL_COMMUNITY)
Admission: RE | Admit: 2022-10-28 | Discharge: 2022-10-28 | Disposition: A | Discharge status: Home / Self Care | Payer: BC Managed Care – PPO | Source: Ambulatory Visit | Attending: Hematology | Admitting: Hematology

## 2022-10-28 DIAGNOSIS — C562 Malignant neoplasm of left ovary: Secondary | ICD-10-CM | POA: Insufficient documentation

## 2022-10-28 DIAGNOSIS — K219 Gastro-esophageal reflux disease without esophagitis: Secondary | ICD-10-CM | POA: Insufficient documentation

## 2022-10-28 DIAGNOSIS — R5383 Other fatigue: Secondary | ICD-10-CM | POA: Insufficient documentation

## 2022-10-28 DIAGNOSIS — K5730 Diverticulosis of large intestine without perforation or abscess without bleeding: Secondary | ICD-10-CM | POA: Insufficient documentation

## 2022-10-28 DIAGNOSIS — Z86010 Personal history of colonic polyps: Secondary | ICD-10-CM | POA: Insufficient documentation

## 2022-10-28 DIAGNOSIS — K760 Fatty (change of) liver, not elsewhere classified: Secondary | ICD-10-CM | POA: Insufficient documentation

## 2022-10-28 DIAGNOSIS — Z9221 Personal history of antineoplastic chemotherapy: Secondary | ICD-10-CM | POA: Insufficient documentation

## 2022-10-28 DIAGNOSIS — Z79899 Other long term (current) drug therapy: Secondary | ICD-10-CM | POA: Insufficient documentation

## 2022-10-28 LAB — CMP (CANCER CENTER ONLY)
ALT: 45 U/L — ABNORMAL HIGH (ref 0–44)
AST: 31 U/L (ref 15–41)
Albumin: 4.1 g/dL (ref 3.5–5.0)
Alkaline Phosphatase: 69 U/L (ref 38–126)
Anion gap: 5 (ref 5–15)
BUN: 16 mg/dL (ref 6–20)
CO2: 27 mmol/L (ref 22–32)
Calcium: 9.5 mg/dL (ref 8.9–10.3)
Chloride: 107 mmol/L (ref 98–111)
Creatinine: 0.75 mg/dL (ref 0.44–1.00)
GFR, Estimated: >60 mL/min (ref >60)
Glucose, Bld: 91 mg/dL (ref 70–99)
Potassium: 4.2 mmol/L (ref 3.5–5.1)
Sodium: 139 mmol/L (ref 135–145)
Total Bilirubin: 0.5 mg/dL (ref 0.3–1.2)
Total Protein: 6.9 g/dL (ref 6.5–8.1)

## 2022-10-28 LAB — CBC WITH DIFFERENTIAL (CANCER CENTER ONLY)
Abs Immature Granulocytes: 0.01 10*3/uL (ref 0.00–0.07)
Basophils Absolute: 0 10*3/uL (ref 0.0–0.1)
Basophils Relative: 0 %
Eosinophils Absolute: 0.3 10*3/uL (ref 0.0–0.5)
Eosinophils Relative: 5 %
HCT: 42.2 % (ref 36.0–46.0)
Hemoglobin: 14.4 g/dL (ref 12.0–15.0)
Immature Granulocytes: 0 %
Lymphocytes Relative: 39 %
Lymphs Abs: 2.3 10*3/uL (ref 0.7–4.0)
MCH: 30 pg (ref 26.0–34.0)
MCHC: 34.1 g/dL (ref 30.0–36.0)
MCV: 87.9 fL (ref 80.0–100.0)
Monocytes Absolute: 0.5 10*3/uL (ref 0.1–1.0)
Monocytes Relative: 8 %
Neutro Abs: 2.7 10*3/uL (ref 1.7–7.7)
Neutrophils Relative %: 48 %
Platelet Count: 294 10*3/uL (ref 150–400)
RBC: 4.8 MIL/uL (ref 3.87–5.11)
RDW: 12.2 % (ref 11.5–15.5)
WBC Count: 5.8 10*3/uL (ref 4.0–10.5)
nRBC: 0 % (ref 0.0–0.2)

## 2022-10-28 MED ORDER — IOHEXOL 300 MG/ML  SOLN
100.0000 mL | Freq: Once | INTRAMUSCULAR | Status: AC | PRN
  Administered 2022-10-28: 100 mL via INTRAVENOUS

## 2022-10-28 MED ORDER — IOHEXOL 9 MG/ML PO SOLN
ORAL | Status: AC
  Filled 2022-10-28: qty 1000

## 2022-10-28 MED ORDER — SODIUM CHLORIDE (PF) 0.9 % IJ SOLN
INTRAMUSCULAR | Status: AC
  Filled 2022-10-28: qty 50

## 2022-10-28 MED ORDER — IOHEXOL 9 MG/ML PO SOLN
1000.0000 mL | ORAL | Status: AC
  Administered 2022-10-28: 1000 mL via ORAL

## 2022-10-30 NOTE — Assessment & Plan Note (Addendum)
Stage IIB -presented with acute left abdominal pain. CT AP on 12/13/21 in ED showed a 6.6 cm hemorrhagic mass. Baseline CA 125 from 01/03/22 was WNL at 9.7. her pain resolved and no recurrent or other symptoms -S/p left salpingo-oophorectomy on 03/12/22 with Dr. Berline Lopes. 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 I spoke with pathologist Dr. Vic Ripper, he agrees with the diagnosis but pointed out that 2% ovarian cancer is intestinal type and IHC will be similar to low GI primary.  -colonoscopy on 03/28/22 with Dr. Bryan Lemma showed no suspicious mass, only polyps.  -baseline CEA on 04/01/22 was WNL.  -PET scan on 04/10/22 showed: no convincing evidence of hypermetabolic disease; postsurgical hypermetabolism in left adnexa and abdominal wall. -enterography on 04/15/22 showed: no small bowel masses or nodularity or tumor along omentum or mesentery; no definite appendiceal tumor. -she completed 4 cycles adjuvant CAPEOX 04/30/22 - 07/02/22 and one additional cycle of Xeloda alone.   -Surveillance CT scan from October 28, 2022 showed no evidence of recurrence.  I also discussed the incidental findings with patient.

## 2022-10-30 NOTE — Progress Notes (Unsigned)
[place]   [phone] Fax:(336) [phone]   Clinic Follow up Note   Patient Care Team: Kathyrn Drown, MD as PCP - General (Family Medicine) Terrance Mass, MD (Inactive) as Consulting Physician (Obstetrics and Gynecology) Jovita Gamma, MD as Consulting Physician (Neurosurgery) Rogene Houston, MD as Consulting Physician (Gastroenterology) Truitt Merle, MD as Consulting Physician (Oncology)  Date of Service:  10/31/2022  CHIEF COMPLAINT: f/u of ovarian adenocarcinoma   CURRENT THERAPY:  Surveillance   ASSESSMENT:  [name] is a 62 y.o. female with   Adenocarcinoma (epithelial) of ovary, left (HCC) Stage IIB -presented with acute left abdominal pain. CT [facility] on 12/13/21 in ED showed a 6.6 cm hemorrhagic mass. Baseline CA 125 from 01/03/22 was WNL at 9.7. her pain resolved and no recurrent or other symptoms -S/p left salpingo-oophorectomy on 03/12/22 with Dr. Berline Lopes. 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 I spoke with pathologist Dr. Vic Ripper, he agrees with the diagnosis but pointed out that 2% ovarian cancer is intestinal type and IHC will be similar to low GI primary.  -colonoscopy on 03/28/22 with Dr. Bryan Lemma showed no suspicious mass, only polyps.  -baseline CEA on 04/01/22 was WNL.  -PET scan on 04/10/22 showed: no convincing evidence of hypermetabolic disease; postsurgical hypermetabolism in left adnexa and abdominal wall. -enterography on 04/15/22 showed: no small bowel masses or nodularity or tumor along omentum or mesentery; no definite appendiceal tumor. -she completed 4 cycles adjuvant CAPEOX 04/30/22 - 07/02/22 and one additional cycle of Xeloda alone.   -Surveillance CT scan from October 28, 2022 showed no evidence of recurrence.  I also discussed the incidental findings with patient. -will contact her GI to see if she needs EGD for evaluation of mild esophageal wall  thickening.  She does have history of GERD.    PLAN: - Discuss CT scan findings  -lab reviewed -encourage pt to be active -referral to Weight Management Program -Pt to f/u with GI Dr. Bryan Lemma to see if she needs EGD  -lab,f/u in 3 months    SUMMARY OF ONCOLOGIC HISTORY: Oncology History  Adenocarcinoma (epithelial) of ovary, left ([place])  12/13/2021 Imaging   EXAM: CT ABDOMEN AND PELVIS WITH CONTRAST  IMPRESSION: 1. 6.6 cm hyperdense left adnexal/ovarian mass, likely a hemorrhagic cyst/mass. Recommend follow-up pelvic ultrasound. 2. 1.5 cm hyperenhancing lesion in the right hepatic lobe. Most likely differentials include hemangioma, FNH, adenoma. Consider nonemergent follow-up MRI abdomen with contrast.   01/03/2022 Tumor Marker   CA 125: 9.7 (WNL)   01/14/2022 Imaging   EXAM: MRI ABDOMEN WITHOUT AND WITH CONTRAST  IMPRESSION: Enhancing 13 mm segment VI hepatic lesion measuring 13 mm demonstrates nonspecific imaging characteristics but which is favored to reflect a benign etiology such as focal nodular hyperplasia or a hepatic adenoma. Recommend follow-up MRI in 6 months with and without EOVIST contrast for more definitive characterization and to assess stability.   03/12/2022 Pathology Results   FINAL MICROSCOPIC DIAGNOSIS:   A.   FALLOPIAN TUBE AND OVARY, LEFT, SALPINGO OOPHORECTOMY:  -    Adenocarcinoma, most consistent with metastatic colorectal  adenocarcinoma, see Comment.  -    Fallopian tube, negative for malignancy/intraepithelial carcinoma (STIC).   B.   PELVIC SIDEWALL, RIGHT, BIOPSY:  -    Benign, fibroelastic nodule.  -    Negative for malignancy.   C.   CUL DE SAC, POSTERIOR, BIOPSY:  -    Negative for malignancy.   D.  CUL DE SAC, ANTERIOR, BIOPSY:  -    Negative for malignancy.   E.   PELVIC SIDEWALL, LEFT, BIOPSY:  -    Negative for malignancy.   F.   PARACOLIC GUTTER, LEFT, BIOPSY:  -    Negative for malignancy.   G.   PARACOLIC GUTTER,  RIGHT, BIOPSY:  -    Negative for malignancy.   H.   OMENTUM:  -    Negative for malignancy.   I.   OVARY REMNANT, LEFT, EXCISION:  -    Adenocarcinoma, most consistent with metastatic colorectal  adenocarcinoma.   COMMENT:  The tumor was interrogated with immunohistochemical (IHC) stains.  The tumor cells are diffusely and strongly positive for CDX-2, CK20 and mCEA and the tumor cells are CK7 negative.   This immunoprofile strongly favors metastatic colorectal adenocarcinoma.   The tumor cells are PAX8 negative to focally equivocal, a finding which is non-contributory. Primary ovarian mucinous adenocarcinoma can be CK20, CDX-2 and mCEA positive; although the staining pattern is typically focal and not diffuse, as in this case.  Also, supportive of a metastasis is CK7 negativity (primary ovarian mucinous carcinoma is typically CK7 positive).   There are rare cells with intracytoplasmic mucin  (mucicarmine stain).   The best IHC stain to differentiate between metastatic colorectal  adenocarcinoma and primary ovarian mucinous adenocarcinoma is SATB2. This marker has been ordered and will be performed at [facility].  The result will be reported in an addendum.   Furthermore, the tumor cells are negative for serous markers (p16, p53 (wildtype), WT-1) and negative for endometrioid markers (ER and vimentin).  The Ki-67 mitotic index is high.  The IHC stains and mucicarmine stain have satisfactory controls.    ADDENDUM:  The tumor was interrogated with SATB2 immunohistochemical (IHC) stain, performed at [facility].  The tumor has diffuse strong nuclear reactivity.  This result, in combination with the prior IHC results, is essentially diagnostic of a lower GI (colorectal) or appendiceal primary tumor.  The control is satisfactory.    03/28/2022 Procedure   Colonoscopy, Dr. Bryan Lemma  Impression: - Two 2 to 3 mm polyps in the sigmoid colon, removed with a cold snare. Resected and retrieved. -  The remainder of the colon was normal, including retoflexed views of the right colon. - The examined portion of the ileum was normal. - Non-bleeding internal hemorrhoids.   04/01/2022 Initial Diagnosis   Adenocarcinoma (epithelial) of ovary, left ([place])   04/17/2022 Cancer Staging   Staging form: Ovary, Fallopian Tube, and Primary Peritoneal Carcinoma, AJCC 8th Edition - Clinical stage from 04/17/2022: FIGO Stage IIB (cT2b, cN0, cM0) - Signed by Truitt Merle, MD on 04/22/2022 Stage prefix: Initial diagnosis Histologic grade (G): GX Histologic grading system: 4 grade system   04/29/2022 -  Chemotherapy   Patient is on Treatment Plan : COLORECTAL Xelox ([place]ox)(130/850) q21d     04/30/2022 - 05/20/2022 Chemotherapy   Patient is on Treatment Plan : COLORECTAL Xelox ([place]ox) q21d     07/09/2022 Genetic Testing   Negative hereditary cancer genetic testing: no pathogenic variants detected in [facility] CancerNextExpanded +RNAinsight Panel.  Report date is 07/09/2022.   The CancerNext-Expanded gene panel offered by [facility] and includes sequencing, rearrangement, and RNA analysis for the following 77 genes: AIP, ALK, APC, ATM, AXIN2, BAP1, BARD1, BLM, BMPR1A, BRCA1, BRCA2, BRIP1, CDC73, CDH1, CDK4, CDKN1B, CDKN2A, CHEK2, CTNNA1, DICER1, FANCC, FH, FLCN, GALNT12, KIF1B, LZTR1, MAX, MEN1, MET, MLH1, MSH2, MSH3, MSH6, MUTYH, NBN, NF1, NF2, NTHL1, PALB2, PHOX2B, PMS2, POT1,  PRKAR1A, PTCH1, PTEN, RAD51C, RAD51D, RB1, RECQL, RET, SDHA, SDHAF2, SDHB, SDHC, SDHD, SMAD4, SMARCA4, SMARCB1, SMARCE1, STK11, SUFU, TMEM127, TP53, TSC1, TSC2, VHL and XRCC2 (sequencing and deletion/duplication); EGFR, EGLN1, HOXB13, KIT, MITF, PDGFRA, POLD1, and POLE (sequencing only); EPCAM and GREM1 (deletion/duplication only).   HRD testing is pending.    10/28/2022 Imaging    IMPRESSION: 1. Prior left salpingo-oophorectomy without evidence of local recurrence or metastatic disease within the chest, abdomen, or pelvis. 2. Mild  symmetric distal esophageal wall thickening, correlate for symptoms of esophagitis. 3. Diffuse hepatic steatosis. 4. Cholelithiasis without findings of acute cholecystitis. 5. Nonobstructive 2 mm right renal calculus. 6. Sigmoid colonic diverticulosis without findings of acute diverticulitis.      INTERVAL HISTORY:  [name] is here for a follow up of ovarian adenocarcinoma  She was last seen by me on 07/31/2022 She presents to the clinic accompanied by husband. Pt states she is feeling better since finishing treatment. She reports she still have some fatigue. She has some  cramping pain in the LLQ of abdomen. Denies having diarrhea. Pt  states she walks around the house and trying to stay active.      All other systems were reviewed with the patient and are negative.  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   BMI 35.0-35.9,adult    Cancer ([place]) 03/12/2022   colon adenoca on ovary   Gall stone    GERD (gastroesophageal reflux disease)    Normal spontaneous vaginal delivery    2   Pneumonia     SURGICAL HISTORY: Past Surgical History:  Procedure Laterality Date   BREAST BIOPSY Left 07/15/2017   2 masses   BUNIONECTOMY  08/2021   COLONOSCOPY N/A 06/09/2014   Procedure: COLONOSCOPY;  Surgeon: Rogene Houston, MD;  Location[facility];  Service: Endoscopy;  Laterality: N/A;  830-moved to 945 Ann to notify pt   COLONOSCOPY  03/28/2022   HYSTEROTOMY     LAPAROSCOPIC TOTAL HYSTERECTOMY  07/18/2010   RSO   NECK SURGERY  11/2012   c6 c7   ROBOTIC ASSISTED SALPINGO OOPHERECTOMY Left 03/12/2022   Procedure: XI ROBOTIC ASSISTED LEFT SALPINGO OOPHORECTOMY WITH STAGING;  Surgeon: Lafonda Mosses, MD;  Location: [facility] ORS;  Service: Gynecology;  Laterality: Left;    I have reviewed the social history and family history with the patient and they are  unchanged from previous note.  ALLERGIES:  is allergic to wound dressing adhesive.  MEDICATIONS:  Current Outpatient Medications  Medication Sig Dispense Refill   BIOTIN PO Take 4 tablets by mouth daily.     cephALEXin (KEFLEX) 500 MG capsule One tid for 7 days 21 capsule 0   Cholecalciferol (VITAMIN D3 PO) Take 1 tablet by mouth daily.     famotidine (PEPCID) 20 MG tablet Take 1 tablet (20 mg total) by mouth 2 (two) times daily. 60 tablet 5   ibuprofen (ADVIL) 800 MG tablet Take 1 tablet (800 mg total) by mouth every 8 (eight) hours as needed for moderate pain. For AFTER surgery only 30 tablet 0   loratadine (CLARITIN) 10 MG tablet Take 10 mg by mouth daily.     ondansetron (ZOFRAN) 8 MG tablet Take by mouth.     pantoprazole (PROTONIX) 40 MG tablet TAKE ONE TABLET ('40MG'$  TOTAL) BY MOUTH DAILY 90 tablet 1   rosuvastatin (CRESTOR) 5 MG tablet  TAKE ONE TABLET BY MOUTH ON MONDAYS, WEDNESDAYS, AND FRIDAYS FOR CHOLESTEROL 36 tablet 1   senna-docusate (SENOKOT-S) 8.6-50 MG tablet Take 2 tablets by mouth at bedtime. For AFTER surgery, do not take if having diarrhea 30 tablet 0   venlafaxine XR (EFFEXOR-XR) 75 MG 24 hr capsule Take 1 capsule (75 mg total) by mouth daily with breakfast. 30 capsule 1   vitamin C (ASCORBIC ACID) 500 MG tablet Take 1,000 mg by mouth at bedtime.     No current facility-administered medications for this visit.    PHYSICAL EXAMINATION: ECOG PERFORMANCE STATUS: 0 - Asymptomatic  Vitals:   10/31/22 0906  BP: (!) 128/53  Pulse: 79  Resp: 18  Temp: 98 F (36.7 C)  SpO2: 98%   Wt Readings from Last 3 Encounters:  10/31/22 219 lb (99.3 kg)  07/31/22 211 lb 14.4 oz (96.1 kg)  07/26/22 208 lb 11.2 oz (94.7 kg)     GENERAL:alert, no distress and comfortable SKIN: skin color normal, no rashes or significant lesions EYES: normal, Conjunctiva are pink and non-injected, sclera clear  NEURO: alert & oriented x 3 with fluent speech LABORATORY DATA:  I have reviewed  the data as listed    Latest Ref Rng & Units 10/28/2022    9:00 AM 07/30/2022   10:18 AM 07/25/2022    2:20 PM  CBC  WBC 4.0 - 10.5 K/uL 5.8  3.7  5.2   Hemoglobin 12.0 - 15.0 g/dL 14.4  13.1  14.5   Hematocrit 36.0 - 46.0 % 42.2  37.2  41.9   Platelets 150 - 400 K/uL 294  176  146         Latest Ref Rng & Units 10/28/2022    9:00 AM 07/30/2022   10:18 AM 07/25/2022    2:20 PM  CMP  Glucose 70 - 99 mg/dL 91  103  104   BUN 6 - 20 mg/dL '16  9  11   '$ Creatinine 0.44 - 1.00 mg/dL 0.75  0.64  0.70   Sodium 135 - 145 mmol/L 139  141  138   Potassium 3.5 - 5.1 mmol/L 4.2  3.7  3.9   Chloride 98 - 111 mmol/L 107  108  104   CO2 22 - 32 mmol/L '27  26  28   '$ Calcium 8.9 - 10.3 mg/dL 9.5  9.2  9.8   Total Protein 6.5 - 8.1 g/dL 6.9  7.0  7.4   Total Bilirubin 0.3 - 1.2 mg/dL 0.5  0.7  0.9   Alkaline Phos 38 - 126 U/L 69  88  111   AST 15 - 41 U/L 31  43  90   ALT 0 - 44 U/L 45  72  131       RADIOGRAPHIC STUDIES: I have personally reviewed the radiological images as listed and agreed with the findings in the report. No results found.    Orders Placed This Encounter  Procedures   Amb Ref to Medical Weight Management    Referral Priority:   Routine    Referral Type:   Consultation    Number of Visits Requested:   1   All questions were answered. The patient knows to call the clinic with any problems, questions or concerns. No barriers to learning was detected. The total time spent in the appointment was 30 minutes.     Truitt Merle, MD 10/31/2022   Felicity Coyer, CMA, am acting as scribe for Truitt Merle, MD.  I have reviewed the above documentation for accuracy and completeness, and I agree with the above.

## 2022-10-31 ENCOUNTER — Encounter: Payer: Self-pay | Admitting: Hematology

## 2022-10-31 ENCOUNTER — Encounter: Payer: Self-pay | Admitting: Family Medicine

## 2022-10-31 ENCOUNTER — Inpatient Hospital Stay (HOSPITAL_BASED_OUTPATIENT_CLINIC_OR_DEPARTMENT_OTHER): Payer: BC Managed Care – PPO | Admitting: Hematology

## 2022-10-31 VITALS — BP 128/53 | HR 79 | Temp 98.0°F | Resp 18 | Ht 65.0 in | Wt 219.0 lb

## 2022-10-31 DIAGNOSIS — R5383 Other fatigue: Secondary | ICD-10-CM | POA: Diagnosis not present

## 2022-10-31 DIAGNOSIS — K760 Fatty (change of) liver, not elsewhere classified: Secondary | ICD-10-CM | POA: Diagnosis not present

## 2022-10-31 DIAGNOSIS — C562 Malignant neoplasm of left ovary: Secondary | ICD-10-CM

## 2022-10-31 DIAGNOSIS — Z9221 Personal history of antineoplastic chemotherapy: Secondary | ICD-10-CM | POA: Diagnosis not present

## 2022-10-31 DIAGNOSIS — Z86010 Personal history of colonic polyps: Secondary | ICD-10-CM | POA: Diagnosis not present

## 2022-10-31 DIAGNOSIS — E669 Obesity, unspecified: Secondary | ICD-10-CM | POA: Diagnosis not present

## 2022-10-31 DIAGNOSIS — K219 Gastro-esophageal reflux disease without esophagitis: Secondary | ICD-10-CM | POA: Diagnosis not present

## 2022-10-31 DIAGNOSIS — K5730 Diverticulosis of large intestine without perforation or abscess without bleeding: Secondary | ICD-10-CM | POA: Diagnosis not present

## 2022-10-31 DIAGNOSIS — Z79899 Other long term (current) drug therapy: Secondary | ICD-10-CM | POA: Diagnosis not present

## 2022-11-01 ENCOUNTER — Other Ambulatory Visit: Payer: Self-pay

## 2022-11-03 ENCOUNTER — Other Ambulatory Visit: Payer: Self-pay

## 2022-11-05 ENCOUNTER — Other Ambulatory Visit: Payer: Self-pay | Admitting: Family Medicine

## 2022-11-06 ENCOUNTER — Encounter: Payer: Self-pay | Admitting: Family Medicine

## 2022-11-06 ENCOUNTER — Other Ambulatory Visit: Payer: Self-pay | Admitting: Family Medicine

## 2022-11-06 NOTE — Telephone Encounter (Signed)
Med not on current med list; my chart message sent to patient

## 2022-11-07 ENCOUNTER — Telehealth: Payer: Self-pay

## 2022-11-07 MED ORDER — CITALOPRAM HYDROBROMIDE 20 MG PO TABS: 20.0000 mg | ORAL_TABLET | Freq: Every day | ORAL | 0 refills | Status: DC

## 2022-11-07 NOTE — Telephone Encounter (Signed)
May have 90 with 1 refill patient needs to do a follow-up visit with myself by this spring

## 2022-11-07 NOTE — Telephone Encounter (Signed)
Spoke with pt and gave pt recommendations. Pt wanted to be scheduled for an office visit. Pt scheduled with Dr. Bryan Lemma on 12/05/22 at 9:40 am. Pt verbalized understanding and had no other concerns at end of call.

## 2022-11-07 NOTE — Addendum Note (Signed)
Addended by: Dairl Ponder on: 11/07/2022 04:54 PM   Modules accepted: Orders

## 2022-11-07 NOTE — Telephone Encounter (Signed)
Thank you for your message Due to medical record documentation standards and medical record privacy laws and the message that is received that is regarding an individual's unrelated to the MyChart messenger's chart Will be transferred over as a phone message under the proper persons chart and sent to the provider. This will be handled as a phone message and staff will get back to you more than likely by phone soonb (For example-sending a MyChart message about another patient within the family on another family members chart) We ask in the future that  MyChart messages regarding a specific individual is sent on their MyChart account and not on another members MyChart. If you need assistance setting up another member's family MyChart please connect with the front office who can assist you or help connect you with the IT department.

## 2022-11-07 NOTE — Telephone Encounter (Signed)
-----  Message from Grayson, DO sent at 11/04/2022  4:20 PM EST ----- I received a message from this patient's Oncologist regarding abnormal finding on CT, showing mild circumferential inflammation in the distal esophagus.  This is most likely reflux changes, but given her history, we can do an upper endoscopy to evaluate further.  If her preference would be to proceed directly to upper endoscopy, I think that is perfectly reasonable.  If she would prefer to see me in the office first and talk about reflux, endoscopy, etc., please book appointment with me.  Thanks.

## 2022-11-29 ENCOUNTER — Ambulatory Visit
Admission: RE | Admit: 2022-11-29 | Discharge: 2022-11-29 | Disposition: A | Discharge status: Home / Self Care | Payer: BC Managed Care – PPO | Source: Ambulatory Visit | Attending: Family Medicine | Admitting: Family Medicine

## 2022-11-29 DIAGNOSIS — Z1231 Encounter for screening mammogram for malignant neoplasm of breast: Secondary | ICD-10-CM

## 2022-12-02 ENCOUNTER — Encounter (INDEPENDENT_AMBULATORY_CARE_PROVIDER_SITE_OTHER): Payer: BC Managed Care – PPO | Admitting: Family Medicine

## 2022-12-02 DIAGNOSIS — Z0289 Encounter for other administrative examinations: Secondary | ICD-10-CM

## 2022-12-05 ENCOUNTER — Encounter: Payer: Self-pay | Admitting: Gastroenterology

## 2022-12-05 ENCOUNTER — Ambulatory Visit: Payer: BC Managed Care – PPO | Admitting: Gastroenterology

## 2022-12-05 VITALS — BP 120/84 | HR 82 | Ht 68.0 in | Wt 210.0 lb

## 2022-12-05 DIAGNOSIS — R059 Cough, unspecified: Secondary | ICD-10-CM

## 2022-12-05 DIAGNOSIS — R9389 Abnormal findings on diagnostic imaging of other specified body structures: Secondary | ICD-10-CM | POA: Diagnosis not present

## 2022-12-05 DIAGNOSIS — Z86010 Personal history of colonic polyps: Secondary | ICD-10-CM

## 2022-12-05 NOTE — Patient Instructions (Addendum)
You have been scheduled for an endoscopy. Please follow written instructions given to you at your visit today. If you use inhalers (even only as needed), please bring them with you on the day of your procedure.   _______________________________________________________  If your blood pressure at your visit was 140/90 or greater, please contact your primary care physician to follow up on this.  _______________________________________________________ If you are age 48 or younger, your body mass index should be between 19-25. Your Body mass index is 31.93 kg/m. If this is out of the aformentioned range listed, please consider follow up with your Primary Care Provider.   __________________________________________________________  The [facility] providers would like to encourage you to use [facility] to communicate with providers for non-urgent requests or questions.  Due to long hold times on the telephone, sending your provider a message by [facility] may be a faster and more efficient way to get a response.  Please allow 48 business hours for a response.  Please remember that this is for non-urgent requests.   Due to recent changes in healthcare laws, you may see the results of your imaging and laboratory studies on MyChart before your provider has had a chance to review them.  We understand that in some cases there may be results that are confusing or concerning to you. Not all laboratory results come back in the same time frame and the provider may be waiting for multiple results in order to interpret others.  Please give [place] 48 hours in order for your provider to thoroughly review all the results before contacting the office for clarification of your results.    Thank you for choosing me and [facility].  Vito Cirigliano, D.O.

## 2022-12-05 NOTE — Progress Notes (Signed)
Chief Complaint:    Abnormal imaging result  GI History: 61 year old female with history of left ovarian adenocarcinoma   - 12/13/2021: CT abdomen/pelvis: Normal liver parenchyma with 1.5 cm hyperenhancing lesion in the right hepatic lobe, normal pancreas, GI tract.  No lymphadenopathy.  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  Otherwise normal visualized GI tract.  Recommend repeat MRI liver with and without Eovist in 6 months - 03/12/2022: Robotic-assisted laparoscopic unilateral salpingo-oophorectomy, tumor staging including peritoneal biopsies, infracolic omentectomy, oversew of ascending colon serosa - Pathology from left fallopian tube and ovary with adenocarcinoma, most consistent with metastatic colorectal adenocarcinoma - Remainder of the resected segments were otherwise benign - 03/27/2022: Evaluated in the GI clinic for expedited colonoscopy.   - 03/28/2022: Colonoscopy: 2 subcentimeter sigmoid tubular adenomas, otherwise normal colon.  Normal TI.  Small grade 1 internal hemorrhoids.  Repeat colonoscopy in 5 years - 04/01/2022: CEA normal - 04/10/2022: PET/CT: No hypermetabolic disease - [id number]: CT enterography: Normal - 7/25 - 9/26, 2023: Completed 4 cycles of CAPEOX and 1 cycle of Xeloda - 10/28/2022: CT C/A/P: Mild symmetric distal esophageal wall thickening, sigmoid diverticulosis, otherwise normal GI tract.  Hepatic steatosis without hepatic lesion.  Cholelithiasis without cholecystitis.  Prior left salpingo-oophorectomy without evidence of local recurrence or metastatic disease  Endoscopic History: - Colonoscopy (06/09/2014): 2 small sigmoid polyps removed with forceps (path benign), otherwise normal colon  - 03/28/2022: Colonoscopy: 2 subcentimeter sigmoid tubular adenomas, otherwise normal colon.  Normal TI.   Small grade 1 internal hemorrhoids.  Repeat colonoscopy in 5 years  HPI:     Patient is a 62 y.o. female presenting to the Gastroenterology Clinic for follow-up and evaluation of recent abnormal finding on CT scan as below.  - 10/28/2022: CT C/A/P: Mild symmetric distal esophageal wall thickening, sigmoid diverticulosis, otherwise normal GI tract.  Hepatic steatosis without hepatic lesion.  Cholelithiasis without cholecystitis.  Prior left salpingo-oophorectomy without evidence of local recurrence or metastatic disease  Does have chronic cough. Started Protonix 40 mg 1 year ago. No HB, regurgitation, dysphagia.  Cough has improved, but not resolved with PPI.      Latest Ref Rng & Units 10/28/2022    9:00 AM 07/30/2022   10:18 AM 07/25/2022    2:20 PM  CBC  WBC 4.0 - 10.5 K/uL 5.8  3.7  5.2   Hemoglobin 12.0 - 15.0 g/dL 14.4  13.1  14.5   Hematocrit 36.0 - 46.0 % 42.2  37.2  41.9   Platelets 150 - 400 K/uL 294  176  146       Latest Ref Rng & Units 10/28/2022    9:00 AM 07/30/2022   10:18 AM 07/25/2022    2:20 PM  CMP  Glucose 70 - 99 mg/dL 91  103  104   BUN 6 - 20 mg/dL '16  9  11   '$ Creatinine 0.44 - 1.00 mg/dL 0.75  0.64  0.70   Sodium 135 - 145 mmol/L 139  141  138   Potassium 3.5 - 5.1 mmol/L 4.2  3.7  3.9   Chloride 98 - 111 mmol/L 107  108  104   CO2 22 - 32 mmol/L '27  26  28   '$ Calcium 8.9 - 10.3 mg/dL 9.5  9.2  9.8   Total Protein 6.5 -  8.1 g/dL 6.9  7.0  7.4   Total Bilirubin 0.3 - 1.2 mg/dL 0.5  0.7  0.9   Alkaline Phos 38 - 126 U/L 69  88  111   AST 15 - 41 U/L 31  43  90   ALT 0 - 44 U/L 45  72  131      Review of systems:     No chest pain, no SOB, no fevers, no urinary sx   Past Medical History:  Diagnosis Date   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     Cholecalciferol (VITAMIN D3 PO) Take 1 tablet by mouth daily.     citalopram (CELEXA) 20 MG tablet Take 1 tablet (20 mg total) by mouth daily. 90 tablet 0   famotidine (PEPCID) 20 MG tablet Take 1 tablet (20 mg total) by mouth 2 (two) times daily. 60 tablet 5   ibuprofen (ADVIL) 800 MG tablet Take 1 tablet (800 mg total) by mouth every 8 (eight) hours as needed for moderate pain. For AFTER surgery only 30 tablet 0   loratadine (CLARITIN) 10 MG tablet Take 10 mg by mouth daily.     pantoprazole (PROTONIX) 40 MG tablet TAKE ONE TABLET ('40MG'$  TOTAL) BY MOUTH DAILY 90 tablet 1   rosuvastatin (CRESTOR) 5 MG tablet TAKE ONE TABLET BY MOUTH ON MONDAYS, WEDNESDAYS, AND FRIDAYS FOR CHOLESTEROL 36 tablet 1   No current facility-administered medications for this visit.    Physical Exam:     BP 120/84   Pulse 82   Ht '5\' 8"'$  (1.727 m)   Wt 210 lb (95.3 kg)   BMI 31.93 kg/m   GENERAL:  Pleasant female in NAD PSYCH: : Cooperative, normal affect NEURO: Alert and oriented x 3, no focal neurologic deficits   IMPRESSION and PLAN:    1) Abnormal imaging Circumferential thickening of distal esophagus on recent CT.  Discussed DDx for this radiographic finding at length today with patient and her husband. - EGD to evaluate further  2) Chronic cough Improvement, but not resolution with PPI over the last year.  Otherwise no typical reflux symptoms - Evaluate for erosive esophagitis, LES laxity, hiatal hernia at time of EGD as above - If significant reflux changes on EGD, will increase/modify acid suppression therapy  3) History of colon polyps - Repeat colonoscopy in 2028 for ongoing polyp surveillance   The  indications, risks, and benefits of EGD were explained to the patient in detail. Risks include but are not limited to bleeding, perforation, adverse reaction to medications, and cardiopulmonary compromise. Sequelae include but are not limited to the possibility of surgery, hospitalization, and mortality. The patient verbalized understanding and wished to proceed. All questions answered, referred to scheduler. Further recommendations pending results of the exam.        Lavena Bullion ,DO, FACG 12/05/2022, 9:39 AM

## 2022-12-11 ENCOUNTER — Ambulatory Visit (AMBULATORY_SURGERY_CENTER): Payer: BC Managed Care – PPO | Admitting: Gastroenterology

## 2022-12-11 ENCOUNTER — Encounter: Payer: Self-pay | Admitting: Gastroenterology

## 2022-12-11 VITALS — BP 120/45 | HR 78 | Temp 97.2°F | Resp 12 | Ht 68.0 in | Wt 210.0 lb

## 2022-12-11 DIAGNOSIS — R9389 Abnormal findings on diagnostic imaging of other specified body structures: Secondary | ICD-10-CM

## 2022-12-11 DIAGNOSIS — R059 Cough, unspecified: Secondary | ICD-10-CM | POA: Diagnosis not present

## 2022-12-11 DIAGNOSIS — K209 Esophagitis, unspecified without bleeding: Secondary | ICD-10-CM

## 2022-12-11 DIAGNOSIS — K2100 Gastro-esophageal reflux disease with esophagitis, without bleeding: Secondary | ICD-10-CM | POA: Diagnosis not present

## 2022-12-11 MED ORDER — SODIUM CHLORIDE 0.9 % IV SOLN: 500.0000 mL | Freq: Once | INTRAVENOUS | Status: DC

## 2022-12-11 MED ORDER — PANTOPRAZOLE SODIUM 40 MG PO TBEC: 40.0000 mg | DELAYED_RELEASE_TABLET | Freq: Two times a day (BID) | ORAL | 3 refills | Status: DC

## 2022-12-11 NOTE — Patient Instructions (Addendum)
Patient has a contact number available for                            emergencies. The signs and symptoms of potential                            delayed complications were discussed with the                            patient. Return to normal activities tomorrow.                            Written discharge instructions were provided to the                            patient.                           - Resume previous diet.                           - Continue present medications.                           - Await pathology results.                           - Increase Protonix (pantoprazole) to 40 mg PO BID                            for 4 weeks for mucosal healing and treatment of                            chronic cough, then reduce back to 40 mg daily.   YOU HAD AN ENDOSCOPIC PROCEDURE TODAY AT [facility]:   Refer to the procedure report that was given to you for any specific questions about what was found during the examination.  If the procedure report does not answer your questions, please call your gastroenterologist to clarify.  If you requested that your care partner not be given the details of your procedure findings, then the procedure report has been included in a sealed envelope for you to review at your convenience later.  YOU SHOULD EXPECT: Some feelings of bloating in the abdomen. Passage of more gas than usual.  Walking can help get rid of the air that was put into your GI tract during the procedure and reduce the bloating. If you had a lower endoscopy (such as a colonoscopy or flexible sigmoidoscopy) you may notice spotting of blood in your stool or on the toilet paper. If you underwent a bowel prep for your procedure, you may not have a normal bowel movement for a few days.  Please Note:  You might notice some irritation and congestion in your nose or some drainage.  This is from the oxygen used during your procedure.  There is no need for concern and it  should clear up in a day or so.  SYMPTOMS TO REPORT IMMEDIATELY:   Following upper endoscopy (EGD)  Vomiting  of blood or coffee ground material  New chest pain or pain under the shoulder blades  Painful or persistently difficult swallowing  New shortness of breath  Fever of 100F or higher  Black, tarry-looking stools  For urgent or emergent issues, a gastroenterologist can be reached at any hour by calling [phone]. Do not use MyChart messaging for urgent concerns.    DIET:  We do recommend a small meal at first, but then you may proceed to your regular diet.  Drink plenty of fluids but you should avoid alcoholic beverages for 24 hours.  ACTIVITY:  You should plan to take it easy for the rest of today and you should NOT DRIVE or use heavy machinery until tomorrow (because of the sedation medicines used during the test).    FOLLOW UP: Our staff will call the number listed on your records the next business day following your procedure.  We will call around 7:15- 8:00 am to check on you and address any questions or concerns that you may have regarding the information given to you following your procedure. If we do not reach you, we will leave a message.     If any biopsies were taken you will be contacted by phone or by letter within the next 1-3 weeks.  Please call us at [phone] if you have not heard about the biopsies in 3 weeks.    SIGNATURES/CONFIDENTIALITY: You and/or your care partner have signed paperwork which will be entered into your electronic medical record.  These signatures attest to the fact that that the information above on your After Visit Summary has been reviewed and is understood.  Full responsibility of the confidentiality of this discharge information lies with you and/or your care-partner.

## 2022-12-11 NOTE — Progress Notes (Signed)
Called to room to assist during endoscopic procedure.  Patient ID and intended procedure confirmed with present staff. Received instructions for my participation in the procedure from the performing physician.  

## 2022-12-11 NOTE — Progress Notes (Unsigned)
Vss nad trans to pacu °

## 2022-12-11 NOTE — Op Note (Signed)
[place] Patient Name: [name] Procedure Date: 12/11/2022 4:32 PM MRN: [id number] Endoscopist: Gerrit Heck , MD, [id number] Age: 62 Referring MD:  Date of Birth: [birth year] Gender: Female Account #: [id number] Procedure:                Upper GI endoscopy Indications:              Suspected esophageal reflux, Abnormal CT of the GI                            tract, Chronic cough                           10/28/2022: CT C/A/P: Mild symmetric distal                            esophageal wall thickening, sigmoid diverticulosis,                            otherwise normal GI tract. Hepatic steatosis                            without hepatic lesion. Cholelithiasis without                            cholecystitis. Prior left salpingo-oophorectomy                            without evidence of local recurrence or metastatic                            disease Medicines:                Monitored Anesthesia Care Procedure:                Pre-Anesthesia Assessment:                           - Prior to the procedure, a History and Physical                            was performed, and patient medications and                            allergies were reviewed. The patient's tolerance of                            previous anesthesia was also reviewed. The risks                            and benefits of the procedure and the sedation                            options and risks were discussed with the patient.                            All questions were  answered, and informed consent                            was obtained. Prior Anticoagulants: The patient has                            taken no anticoagulant or antiplatelet agents. ASA                            Grade Assessment: II - A patient with mild systemic                            disease. After reviewing the risks and benefits,                            the patient was deemed in satisfactory condition to                             undergo the procedure.                           After obtaining informed consent, the endoscope was                            passed under direct vision. Throughout the                            procedure, the patient's blood pressure, pulse, and                            oxygen saturations were monitored continuously. The                            GIF [id number] was introduced through the                            mouth, and advanced to the second part of duodenum.                            The upper GI endoscopy was accomplished without                            difficulty. The patient tolerated the procedure                            well. Scope In: Scope Out: Findings:                 The upper third of the esophagus and middle third                            of the esophagus were normal.                           Mildly severe esophagitis with no bleeding  was                            found in the lower third of the esophagus,                            characterized by erythema and mild edema. Biopsies                            were taken with a cold forceps for histology.                            Estimated blood loss was minimal.                           The entire examined stomach was normal.                           The examined duodenum was normal. Complications:            No immediate complications. Estimated Blood Loss:     Estimated blood loss was minimal. Impression:               - Normal upper third of esophagus and middle third                            of esophagus.                           - Mildly severe esophagitis with no bleeding.                            Biopsied.                           - Normal stomach.                           - Normal examined duodenum. Recommendation:           - Patient has a contact number available for                            emergencies. The signs and symptoms of potential                             delayed complications were discussed with the                            patient. Return to normal activities tomorrow.                            Written discharge instructions were provided to the                            patient.                           -  Resume previous diet.                           - Continue present medications.                           - Await pathology results.                           - Increase Protonix (pantoprazole) to 40 mg PO BID                            for 4 weeks for mucosal healing and treatment of                            chronic cough, then reduce back to 40 mg daily. Gerrit Heck, MD 12/11/2022 5:04:22 PM

## 2022-12-11 NOTE — Progress Notes (Unsigned)
GASTROENTEROLOGY PROCEDURE H&P NOTE   Primary Care Physician: Kathyrn Drown, MD    Reason for Procedure:   Abnormal imaging result , chronic cough  Plan:    EGD  Patient is appropriate for endoscopic procedure(s) in the ambulatory ([facility]) setting.  The nature of the procedure, as well as the risks, benefits, and alternatives were carefully and thoroughly reviewed with the patient. Ample time for discussion and questions allowed. The patient understood, was satisfied, and agreed to proceed.     HPI: [name] is a 62 y.o. female who presents for EGD for evaluation of Abnormal imaging result and chronic cough.  Patient was most recently seen in the Gastroenterology Clinic on 12/05/2022 by me.  No interval change in medical history since that appointment. Please refer to that note for full details regarding GI history and clinical presentation.   - 10/28/2022: CT C/A/P: Mild symmetric distal esophageal wall thickening, sigmoid diverticulosis, otherwise normal GI tract. Hepatic steatosis without hepatic lesion. Cholelithiasis without cholecystitis. 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   Gall stone    GERD (gastroesophageal reflux disease)    Hyperlipidemia    Normal spontaneous vaginal delivery    2   Pneumonia     Past Surgical History:  Procedure Laterality Date   BREAST BIOPSY Left 07/15/2017   2 masses   BUNIONECTOMY  08/2021   COLONOSCOPY N/A 06/09/2014   Procedure: COLONOSCOPY;  Surgeon: Rogene Houston, MD;  Location: [facility];  Service: Endoscopy;  Laterality: N/A;  830-moved to 945 Ann to notify pt   COLONOSCOPY  03/28/2022   HYSTEROTOMY     LAPAROSCOPIC TOTAL HYSTERECTOMY   07/18/2010   RSO   NECK SURGERY  11/2012   c6 c7   ROBOTIC ASSISTED SALPINGO OOPHERECTOMY Left 03/12/2022   Procedure: XI ROBOTIC ASSISTED LEFT SALPINGO OOPHORECTOMY WITH STAGING;  Surgeon: Lafonda Mosses, MD;  Location: [facility];  Service: Gynecology;  Laterality: Left;    Prior to Admission medications   Medication Sig Start Date End Date Taking? Authorizing Provider  BIOTIN PO Take 4 tablets by mouth daily.   Yes [provider]  Cholecalciferol (VITAMIN D3 PO) Take 1 tablet by mouth daily.   Yes [provider]  citalopram (CELEXA) 20 MG tablet Take 1 tablet (20 mg total) by mouth daily. 11/07/22  Yes Kathyrn Drown, MD  famotidine (PEPCID) 20 MG tablet Take 1 tablet (20 mg total) by mouth 2 (two) times daily. 04/23/22  Yes Luking, Elayne Snare, MD  loratadine (CLARITIN) 10 MG tablet Take 10 mg by mouth daily.   Yes [provider]  pantoprazole (PROTONIX) 40 MG tablet TAKE ONE TABLET ('40MG'$  TOTAL) BY MOUTH DAILY 10/16/22  Yes Luking, Scott A, MD  rosuvastatin (CRESTOR) 5 MG tablet TAKE ONE TABLET BY MOUTH ON MONDAYS, WEDNESDAYS, AND FRIDAYS FOR CHOLESTEROL 07/11/22  Yes Luking, Scott A, MD  ibuprofen (ADVIL) 800 MG tablet Take 1 tablet (800 mg total) by mouth every 8 (eight) hours as needed for moderate pain. For AFTER surgery only 03/11/22   Joylene John D, NP  prochlorperazine (COMPAZINE) 10 MG tablet TAKE ONE TABLET ('10MG'$  TOTAL) BY MOUTH EVERY SIX HOURS AS NEEDED (  NAUSEA OR VOMITING 05/31/22 06/10/22  Truitt Merle, MD    Current Outpatient Medications  Medication Sig Dispense Refill   BIOTIN PO Take 4 tablets by mouth daily.     Cholecalciferol (VITAMIN D3 PO) Take 1 tablet by mouth daily.     citalopram (CELEXA) 20 MG tablet Take 1 tablet (20 mg total) by mouth daily. 90 tablet 0   famotidine (PEPCID) 20 MG tablet Take 1 tablet (20 mg total) by mouth 2 (two) times daily. 60 tablet 5   loratadine (CLARITIN) 10 MG tablet Take 10 mg by mouth daily.     pantoprazole  (PROTONIX) 40 MG tablet TAKE ONE TABLET ('40MG'$  TOTAL) BY MOUTH DAILY 90 tablet 1   rosuvastatin (CRESTOR) 5 MG tablet TAKE ONE TABLET BY MOUTH ON MONDAYS, WEDNESDAYS, AND FRIDAYS FOR CHOLESTEROL 36 tablet 1   ibuprofen (ADVIL) 800 MG tablet Take 1 tablet (800 mg total) by mouth every 8 (eight) hours as needed for moderate pain. For AFTER surgery only 30 tablet 0   Current Facility-Administered Medications  Medication Dose Route Frequency Provider Last Rate Last Admin   0.9 %  sodium chloride infusion  500 mL Intravenous Once Areli Frary V, DO        Allergies as of 12/11/2022 - Review Complete 12/11/2022  Allergen Reaction Noted   Wound dressing adhesive Rash 03/13/2022    Family History  Problem Relation Age of Onset   Heart disease Mother    Prostate cancer Father 43   Breast cancer Sister 51       metastatic   Diabetes Brother    Prostate cancer Brother        dx after age 65; x2 brothers   Breast cancer Cousin        mat female cousin; dx 79s   Colon cancer Neg Hx    Ovarian cancer Neg Hx    Endometrial cancer Neg Hx    Pancreatic cancer Neg Hx    Stomach cancer Neg Hx    Esophageal cancer Neg Hx    Rectal cancer Neg Hx     Social History   Socioeconomic History   Marital status: Married    Spouse name: Not on file   Number of children: 2   Years of education: Not on file   Highest education level: Not on file  Occupational History   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   Occupation: retired  Tobacco Use   Smoking status: Never   Smokeless tobacco: Never  Scientific laboratory technician Use: Never used  Substance and Sexual Activity   Alcohol use: Not Currently    Comment: 2-3 times/month   Drug use: No   Sexual activity: Yes    Birth control/protection: Surgical  Other Topics Concern   Not on file  Social History Narrative   Not on file   Social Determinants of Health   Financial Resource Strain: Not on file  Food Insecurity: Not on file   Transportation Needs: Not on file  Physical Activity: Not on file  Stress: Not on file  Social Connections: Not on file  Intimate Partner Violence: Not on file    Physical Exam: Vital signs in last 24 hours: '@BP'$  97/64   Pulse 87   Temp (!) 97.2 F (36.2 C)   Ht '5\' 8"'$  (1.727 m)   Wt 210 lb (95.3 kg)   SpO2 94%   BMI 31.93 kg/m  GEN: NAD EYE: Sclerae anicteric ENT: MMM CV: Non-tachycardic Pulm: CTA b/l  GI: Soft, NT/ND NEURO:  Alert & Oriented x 3   Gerrit Heck, DO [facility]   12/11/2022 4:33 PM

## 2022-12-12 ENCOUNTER — Telehealth: Payer: Self-pay | Admitting: *Deleted

## 2022-12-12 ENCOUNTER — Telehealth: Payer: Self-pay

## 2022-12-12 NOTE — Telephone Encounter (Signed)
-----   Message from [place], DO sent at 12/11/2022  5:10 PM EST ----- Can you please schedule ROV w me in 6-8 weeks or so. Thanks

## 2022-12-12 NOTE — Telephone Encounter (Signed)
  Follow up Call-     12/11/2022    3:47 PM 03/28/2022    2:34 PM  Call back number  Post procedure Call Back phone  # [phone]  [name]'s number  Permission to leave phone message Yes Yes     Patient questions:  Do you have a fever, pain , or abdominal swelling? No. Pain Score  0 *  Have you tolerated food without any problems? Yes.    Have you been able to return to your normal activities? Yes.    Do you have any questions about your discharge instructions: Diet   No. Medications  No. Follow up visit  No.  Do you have questions or concerns about your Care? No.  Actions: * If pain score is 4 or above: No action needed, pain <4.

## 2022-12-12 NOTE — Telephone Encounter (Signed)
7-week follow up scheduled for Wednesday, 01/29/23 at 3:40 pm.  Patient notified of appointment information via [place].

## 2022-12-19 ENCOUNTER — Ambulatory Visit: Payer: BC Managed Care – PPO | Admitting: Orthopaedic Surgery

## 2022-12-19 ENCOUNTER — Other Ambulatory Visit (INDEPENDENT_AMBULATORY_CARE_PROVIDER_SITE_OTHER): Payer: BC Managed Care – PPO

## 2022-12-19 ENCOUNTER — Encounter: Payer: Self-pay | Admitting: Orthopaedic Surgery

## 2022-12-19 DIAGNOSIS — M65311 Trigger thumb, right thumb: Secondary | ICD-10-CM | POA: Diagnosis not present

## 2022-12-19 DIAGNOSIS — M79671 Pain in right foot: Secondary | ICD-10-CM

## 2022-12-19 MED ORDER — BUPIVACAINE HCL 0.5 % IJ SOLN
0.3300 mL | INTRAMUSCULAR | Status: AC | PRN
  Administered 2022-12-19: .33 mL

## 2022-12-19 MED ORDER — LIDOCAINE HCL 1 % IJ SOLN
0.3000 mL | INTRAMUSCULAR | Status: AC | PRN
  Administered 2022-12-19: .3 mL

## 2022-12-19 MED ORDER — METHYLPREDNISOLONE ACETATE 40 MG/ML IJ SUSP
13.3300 mg | INTRAMUSCULAR | Status: AC | PRN
  Administered 2022-12-19: 13.33 mg

## 2022-12-19 NOTE — Progress Notes (Signed)
Office Visit Note   Patient: [name]           Date of Birth: [birth year]           MRN: [id number] Visit Date: 12/19/2022              Requested by: Kathyrn Drown, MD [address],  [place] [address] PCP: Kathyrn Drown, MD   Assessment & Plan: Visit Diagnoses:  1. Pain in right foot   2. Trigger thumb, right thumb     Plan: Impression is right trigger thumb and right foot pain.  Not getting the sense that it is a stress fracture so I think she just needs to back off on some activity.  Trigger thumb injection was performed today.  She tolerates well.  Will see her back as needed.  Follow-Up Instructions: No follow-ups on file.   Orders:  Orders Placed This Encounter  Procedures  . Hand/UE Inj: R thumb A1  . XR Foot Complete Right   No orders of the defined types were placed in this encounter.     Procedures: Hand/UE Inj: R thumb A1 for trigger finger on 12/19/2022 9:25 AM Indications: pain Details: 25 G needle Medications: 0.3 mL lidocaine 1 %; 0.33 mL bupivacaine 0.5 %; 13.33 mg methylPREDNISolone acetate 40 MG/ML Outcome: tolerated well, no immediate complications Consent was given by the patient. Patient was prepped and draped in the usual sterile fashion.      Clinical Data: No additional findings.   Subjective: Chief Complaint  Patient presents with  . Right Hand - Pain    thumb  . Right Foot - Pain    HPI  [name] is a very pleasant 62 year old female who I saw years ago comes in for evaluation of her right trigger thumb for about 2 weeks.  We saw her for this years ago and it did really well from a steroid injection.  Requesting another one today.  Also complaining of lateral right foot pain for about 2 weeks.  She does state that she recently had a change in exercise regimen.  Foot pain is occasional and does not necessarily get worse with activity.  Review of Systems  Constitutional: Negative.   HENT: Negative.    Eyes:  Negative.   Respiratory: Negative.    Cardiovascular: Negative.   Endocrine: Negative.   Musculoskeletal: Negative.   Neurological: Negative.   Hematological: Negative.   Psychiatric/Behavioral: Negative.    All other systems reviewed and are negative.    Objective: Vital Signs: There were no vitals taken for this visit.  Physical Exam Vitals and nursing note reviewed.  Constitutional:      Appearance: She is well-developed.  HENT:     Head: Atraumatic.     Nose: Nose normal.  Eyes:     Extraocular Movements: Extraocular movements intact.  Cardiovascular:     Pulses: Normal pulses.  Pulmonary:     Effort: Pulmonary effort is normal.  Abdominal:     Palpations: Abdomen is soft.  Musculoskeletal:     Cervical back: Neck supple.  Skin:    General: Skin is warm.     Capillary Refill: Capillary refill takes less than 2 seconds.  Neurological:     Mental Status: She is alert. Mental status is at baseline.  Psychiatric:        Behavior: Behavior normal.        Thought Content: Thought content normal.  Judgment: Judgment normal.    Ortho Exam  Examination of right thumb is consistent with triggering and locking.  Tender A1 pulley.  Examination of the right foot is nonfocal.  No real bony tenderness.  No swelling or bruising.  Specialty Comments:  No specialty comments available.  Imaging: XR Foot Complete Right  Result Date: 12/19/2022 No acute or structural abnormalities.    PMFS History: Patient Active Problem List   Diagnosis Date Noted  . Genetic testing 07/10/2022  . 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 Pelvic mass in female 02/04/2022  . BMI 35.0-35.9,adult 02/04/2022  . 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 Hyperlipidemia LDL goal <130 07/21/2020  . Pain of left upper extremity 06/15/2020  . Localized bacterial skin infection 05/19/2020  . 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 Gall stones 03/17/2014  . Esophageal  reflux 07/31/2013  . Chest wall mass, left 04/13/2013  . Left breast mass 01/20/2013  . COUGH 02/07/2009   Past Medical History:  Diagnosis Date  . Allergy   . 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 BMI 35.0-35.9,adult   . 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 Gall stone   . GERD (gastroesophageal reflux disease)   . Hyperlipidemia   . Normal spontaneous vaginal delivery    2  . Pneumonia     Family History  Problem Relation Age of Onset  . Heart disease Mother   . Prostate cancer Father 83  . Breast cancer Sister 46       metastatic  . Diabetes Brother   . Prostate cancer Brother        dx after age 26; x2 brothers  . Breast cancer Cousin        mat female cousin; dx 51s  . Colon cancer Neg Hx   . Ovarian cancer Neg Hx   . Endometrial cancer Neg Hx   . Pancreatic cancer Neg Hx   . Stomach cancer Neg Hx   . Esophageal cancer Neg Hx   . Rectal cancer Neg Hx     Past Surgical History:  Procedure Laterality Date  . BREAST BIOPSY Left 07/15/2017   2 masses  . BUNIONECTOMY  08/2021  . COLONOSCOPY N/A 06/09/2014   Procedure: COLONOSCOPY;  Surgeon: Rogene Houston, MD;  Location[facility];  Service: Endoscopy;  Laterality: N/A;  830-moved to 945 Ann to notify pt  . COLONOSCOPY  03/28/2022  . HYSTEROTOMY    . LAPAROSCOPIC TOTAL HYSTERECTOMY  07/18/2010   RSO  . NECK SURGERY  11/2012   c6 c7  . ROBOTIC ASSISTED SALPINGO OOPHERECTOMY Left 03/12/2022   Procedure: XI ROBOTIC ASSISTED LEFT SALPINGO OOPHORECTOMY WITH STAGING;  Surgeon: Lafonda Mosses, MD;  Location: [facility];  Service: Gynecology;  Laterality: Left;   Social History   Occupational History  . 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 Occupation: retired  Tobacco Use  . Smoking status: Never  . Smokeless tobacco: Never  Vaping Use  . Vaping Use: Never used  Substance and Sexual Activity  . Alcohol use: Not  Currently    Comment: 2-3 times/month  . Drug use: No  . Sexual activity: Yes    Birth control/protection: Surgical

## 2022-12-24 ENCOUNTER — Ambulatory Visit (INDEPENDENT_AMBULATORY_CARE_PROVIDER_SITE_OTHER): Payer: BC Managed Care – PPO | Admitting: Family Medicine

## 2022-12-24 ENCOUNTER — Other Ambulatory Visit: Payer: Self-pay | Admitting: Family Medicine

## 2022-12-24 ENCOUNTER — Encounter (INDEPENDENT_AMBULATORY_CARE_PROVIDER_SITE_OTHER): Payer: Self-pay | Admitting: Family Medicine

## 2022-12-24 VITALS — BP 144/75 | HR 72 | Temp 98.7°F | Ht 66.0 in | Wt 207.0 lb

## 2022-12-24 DIAGNOSIS — Z1331 Encounter for screening for depression: Secondary | ICD-10-CM

## 2022-12-24 DIAGNOSIS — E669 Obesity, unspecified: Secondary | ICD-10-CM

## 2022-12-24 DIAGNOSIS — R030 Elevated blood-pressure reading, without diagnosis of hypertension: Secondary | ICD-10-CM | POA: Diagnosis not present

## 2022-12-24 DIAGNOSIS — E559 Vitamin D deficiency, unspecified: Secondary | ICD-10-CM | POA: Diagnosis not present

## 2022-12-24 DIAGNOSIS — R0602 Shortness of breath: Secondary | ICD-10-CM

## 2022-12-24 DIAGNOSIS — R5383 Other fatigue: Secondary | ICD-10-CM

## 2022-12-24 DIAGNOSIS — E785 Hyperlipidemia, unspecified: Secondary | ICD-10-CM | POA: Diagnosis not present

## 2022-12-24 DIAGNOSIS — Z6833 Body mass index (BMI) 33.0-33.9, adult: Secondary | ICD-10-CM

## 2022-12-24 DIAGNOSIS — Z9189 Other specified personal risk factors, not elsewhere classified: Secondary | ICD-10-CM

## 2022-12-24 DIAGNOSIS — R739 Hyperglycemia, unspecified: Secondary | ICD-10-CM

## 2022-12-24 NOTE — Progress Notes (Signed)
Chief Complaint:   [name] (MR# [id number]) is a 62 y.o. female who presents for evaluation and treatment of obesity and related comorbidities. Current BMI is Body mass index is 33.41 kg/m. [name] has been struggling with her weight for many years and has been unsuccessful in either losing weight, maintaining weight loss, or reaching her healthy weight goal.  Patient is here with her husband [name].  She went to the information session.  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  She underwent 3 rounds of chemo and is currently in surveillance. 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  She lives at home with [name] and her daughter [name].  Desired weight is 150lbs.  Has previously used Phentermine to lose weight 20lbs and cycled it.  Tends to skip breakfast daily due to time.  She has been eating cereal lately (honey bunches of oats with almonds). Works out in the am. [name] about 4 cups of cereal in the am with 2% milk and drinks milk after (feels full).  A few hours later she wants to eat so will have a cup of almonds and water- this would be her lunch (feels both full and satisfied).  May eat 1/2 can of tuna with whole avocado and balsamic vinegar (feel full and satisfied). Typical dinner is spaghetti (1.5cups) with ragu or prego with added spices and ground burger or ground [place].  Or she may eat 2 grilled chicken thighs, steamed vegetable (1/2cup) and sweet potato (1/2cup).  Isn't eating after dinner.   [name] is currently in the action stage of change and ready to dedicate time achieving and maintaining a healthier weight. [name] is interested in becoming our patient and working on intensive lifestyle modifications including (but not limited to) diet and exercise for weight loss.  [name]'s habits were reviewed today and are as follows: Her family eats meals together, she thinks her family will eat healthier with her, her desired weight  loss is 57 lbs, she started gaining weight during chemo, also lack of self care due to taking care of her elderly parents, her heaviest weight ever was 220 pounds, she has significant food cravings issues, she snacks frequently in the evenings, she skips meals frequently, she frequently makes poor food choices, and she struggles with emotional eating.  Depression Screen [name]'s Food and Mood (modified PHQ-9) score was 2.  Subjective:   1. Other fatigue [name] admits to daytime somnolence and admits to waking up still tired. Patient has a history of symptoms of daytime fatigue and morning fatigue. [name] generally gets 7 or 8 hours of sleep per night, and states that she has nightime awakenings and generally restful sleep. Snoring is present. Apneic episodes are present. Epworth Sleepiness Score is 5.  EKG, NSR at 66 bpm.  2. SOBOE (shortness of breath on exertion) [name] notes increasing shortness of breath with exercising and seems to be worsening over time with weight gain. She notes getting out of breath sooner with activity than she used to. This has not gotten worse recently. [name] denies shortness of breath at rest or orthopnea.  3. Hyperlipidemia LDL goal <130 Patient is on rosuvastatin 5 mg.  Last LDL was 112.  4. Vitamin D deficiency Patient is on OTC vitamin D.  Patient is positive for fatigue.  5. Hyperglycemia Patient has had elevated blood sugars in the past.  There is not an A1c on file in [name].  6. Elevated blood pressure reading Patient's blood pressure was slightly elevated today.  Patient is slightly stressed.  7. At risk for heart disease [name] was given approximately 8 minutes of coronary artery disease prevention counseling today. She is 62 y.o. female and has risk factors for heart disease including obesity. We discussed intensive lifestyle modifications today with an emphasis on specific weight loss instructions and strategies.  Patient has a history of postpartum  preeclampsia.  Patient is on blood pressure medications for 9 months after. Repetitive spaced learning was employed today to elicit superior memory formation and behavioral change.    Assessment/Plan:   1. Other fatigue [name] does feel that her weight is causing her energy to be lower than it should be. Fatigue may be related to obesity, depression or many other causes. Labs will be ordered, and in the meanwhile, [name] will focus on self care including making healthy food choices, increasing physical activity and focusing on stress reduction.  Check EKG, IC, labs.  - EKG 12-Lead - T3 - T4, free - TSH  2. SOBOE (shortness of breath on exertion) [name] does feel that she gets out of breath more easily that she used to when she exercises. [name]'s shortness of breath appears to be obesity related and exercise induced. She has agreed to work on weight loss and gradually increase exercise to treat her exercise induced shortness of breath. Will continue to monitor closely.  3. Hyperlipidemia LDL goal <130 Check labs today.  - Lipid Panel With LDL/HDL Ratio  4. Vitamin D deficiency Check labs today.  - VITAMIN D 25 Hydroxy (Vit-D Deficiency, Fractures)  5. Hyperglycemia Check labs today.  - Hemoglobin A1c - Insulin, random  6. Elevated blood pressure reading Follow-up with blood pressure at next appointment.  7. At risk for heart disease Counseled patient on the risk of preeclampsia long-term.  Patient has not seen cardiology.  8. Depression screening [name] had a negative depression screening.  9. Class 1 obesity with serious comorbidity and body mass index (BMI) of 33.0 to 33.9 in adult, unspecified obesity type [name] is currently in the action stage of change and her goal is to continue with weight loss efforts. I recommend [name] begin the structured treatment plan as follows:  She has agreed to the Category 3 Plan.  Exercise goals: As is.  Behavioral modification  strategies: increasing lean protein intake, meal planning and cooking strategies, keeping healthy foods in the home, and planning for success.  She was informed of the importance of frequent follow-up visits to maximize her success with intensive lifestyle modifications for her multiple health conditions. She was informed we would discuss her lab results at her next visit unless there is a critical issue that needs to be addressed sooner. [name] agreed to keep her next visit at the agreed upon time to discuss these results.  Objective:   Blood pressure (!) 144/75, pulse 72, temperature 98.7 F (37.1 C), height 5\' 6"  (1.676 m), weight 207 lb (93.9 kg), SpO2 95 %. Body mass index is 33.41 kg/m.  EKG: Normal sinus rhythm, rate 66 bpm.  Indirect Calorimeter completed today shows a VO2 of 249 and a REE of 1714.  Her calculated basal metabolic rate is [id number] thus her basal metabolic rate is better than expected.  General: Cooperative, alert, well developed, in no acute distress. HEENT: Conjunctivae and lids unremarkable. Cardiovascular: Regular rhythm.  Lungs: Normal work of breathing. Neurologic: No focal deficits.   Lab Results  Component Value Date  CREATININE 0.75 10/28/2022   BUN 16 10/28/2022   NA 139 10/28/2022   K 4.2 10/28/2022   CL 107 10/28/2022   CO2 27 10/28/2022   Lab Results  Component Value Date   ALT 45 (H) 10/28/2022   AST 31 10/28/2022   ALKPHOS 69 10/28/2022   BILITOT 0.5 10/28/2022   Lab Results  Component Value Date   HGBA1C 5.9 (H) 12/24/2022   Lab Results  Component Value Date   INSULIN 12.2 12/24/2022   Lab Results  Component Value Date   TSH 2.930 12/24/2022   Lab Results  Component Value Date   CHOL 214 (H) 12/24/2022   HDL 52 12/24/2022   LDLCALC 127 (H) 12/24/2022   TRIG 199 (H) 12/24/2022   CHOLHDL 3.5 10/27/2020   Lab Results  Component Value Date   WBC 5.8 10/28/2022   HGB 14.4 10/28/2022   HCT 42.2 10/28/2022   MCV 87.9 10/28/2022    PLT 294 10/28/2022   Lab Results  Component Value Date   FERRITIN 99 04/01/2022   Attestation Statements:   Reviewed by clinician on day of visit: allergies, medications, problem list, medical history, surgical history, family history, social history, and previous encounter notes.  I, Davy Pique, RMA, am acting as transcriptionist for Coralie Common, MD.  This is the patient's first visit at [facility]. The patient's NEW PATIENT PACKET was reviewed at length. Included in the packet: current and past health history, medications, allergies, ROS, gynecologic history (women only), surgical history, family history, social history, weight history, weight loss surgery history (for those that have had weight loss surgery), nutritional evaluation, mood and food questionnaire, PHQ9, Epworth questionnaire, sleep habits questionnaire, patient life and health improvement goals questionnaire. These will all be scanned into the patient's chart under media.   During the visit, I independently reviewed the patient's EKG, bioimpedance scale results, and indirect calorimeter results. I used this information to tailor a meal plan for the patient that will help her to lose weight and will improve her obesity-related conditions going forward. I performed a medically necessary appropriate examination and/or evaluation. I discussed the assessment and treatment plan with the patient. The patient was provided an opportunity to ask questions and all were answered. The patient agreed with the plan and demonstrated an understanding of the instructions. Labs were ordered at this visit and will be reviewed at the next visit unless more critical results need to be addressed immediately. Clinical information was updated and documented in the EMR.   I have reviewed the above documentation for accuracy and completeness, and I agree with the above. - Coralie Common, MD

## 2022-12-25 ENCOUNTER — Encounter (INDEPENDENT_AMBULATORY_CARE_PROVIDER_SITE_OTHER): Payer: Self-pay | Admitting: Family Medicine

## 2022-12-25 LAB — INSULIN, RANDOM: INSULIN: 12.2 u[IU]/mL (ref 2.6–24.9)

## 2022-12-25 LAB — HEMOGLOBIN A1C
Est. average glucose Bld gHb Est-mCnc: 123 mg/dL
Hgb A1c MFr Bld: 5.9 % — ABNORMAL HIGH (ref 4.8–5.6)

## 2022-12-25 LAB — LIPID PANEL WITH LDL/HDL RATIO
Cholesterol, Total: 214 mg/dL — ABNORMAL HIGH (ref 100–199)
HDL: 52 mg/dL (ref >39)
LDL Chol Calc (NIH): 127 mg/dL — ABNORMAL HIGH (ref 0–99)
LDL/HDL Ratio: 2.4 ratio (ref 0.0–3.2)
Triglycerides: 199 mg/dL — ABNORMAL HIGH (ref 0–149)
VLDL Cholesterol Cal: 35 mg/dL (ref 5–40)

## 2022-12-25 LAB — VITAMIN D 25 HYDROXY (VIT D DEFICIENCY, FRACTURES): Vit D, 25-Hydroxy: 58.3 ng/mL (ref 30.0–100.0)

## 2022-12-25 LAB — T4, FREE: Free T4: 1.1 ng/dL (ref 0.82–1.77)

## 2022-12-25 LAB — T3: T3, Total: 146 ng/dL (ref 71–180)

## 2022-12-25 LAB — TSH: TSH: 2.93 u[IU]/mL (ref 0.450–4.500)

## 2023-01-07 ENCOUNTER — Ambulatory Visit (INDEPENDENT_AMBULATORY_CARE_PROVIDER_SITE_OTHER): Payer: BC Managed Care – PPO | Admitting: Family Medicine

## 2023-01-08 DIAGNOSIS — J069 Acute upper respiratory infection, unspecified: Secondary | ICD-10-CM | POA: Insufficient documentation

## 2023-01-09 DIAGNOSIS — J398 Other specified diseases of upper respiratory tract: Secondary | ICD-10-CM | POA: Insufficient documentation

## 2023-01-14 ENCOUNTER — Ambulatory Visit (INDEPENDENT_AMBULATORY_CARE_PROVIDER_SITE_OTHER): Payer: BC Managed Care – PPO | Admitting: Family Medicine

## 2023-01-14 ENCOUNTER — Encounter (INDEPENDENT_AMBULATORY_CARE_PROVIDER_SITE_OTHER): Payer: Self-pay | Admitting: Family Medicine

## 2023-01-14 ENCOUNTER — Encounter: Payer: Self-pay | Admitting: Family Medicine

## 2023-01-14 VITALS — BP 131/83 | HR 81 | Temp 99.1°F | Ht 66.0 in | Wt 205.4 lb

## 2023-01-14 DIAGNOSIS — E669 Obesity, unspecified: Secondary | ICD-10-CM | POA: Insufficient documentation

## 2023-01-14 DIAGNOSIS — E785 Hyperlipidemia, unspecified: Secondary | ICD-10-CM

## 2023-01-14 DIAGNOSIS — E559 Vitamin D deficiency, unspecified: Secondary | ICD-10-CM | POA: Insufficient documentation

## 2023-01-14 DIAGNOSIS — K760 Fatty (change of) liver, not elsewhere classified: Secondary | ICD-10-CM | POA: Insufficient documentation

## 2023-01-14 DIAGNOSIS — R739 Hyperglycemia, unspecified: Secondary | ICD-10-CM | POA: Insufficient documentation

## 2023-01-14 DIAGNOSIS — Z6833 Body mass index (BMI) 33.0-33.9, adult: Secondary | ICD-10-CM

## 2023-01-14 DIAGNOSIS — E7849 Other hyperlipidemia: Secondary | ICD-10-CM

## 2023-01-14 DIAGNOSIS — R7303 Prediabetes: Secondary | ICD-10-CM | POA: Diagnosis not present

## 2023-01-14 DIAGNOSIS — R030 Elevated blood-pressure reading, without diagnosis of hypertension: Secondary | ICD-10-CM | POA: Insufficient documentation

## 2023-01-14 MED ORDER — METFORMIN HCL 500 MG PO TABS: ORAL_TABLET | ORAL | 0 refills | Status: DC

## 2023-01-14 NOTE — Progress Notes (Signed)
Carlye Grippe, D.O.  ABFM, ABOM Specializing in Clinical Bariatric Medicine  Office located at: [address] [place], [place]  [address]     Assessment and Plan:   Meds ordered this encounter  Medications   metFORMIN (GLUCOPHAGE) 500 MG tablet    Sig: 1 po with lunch daily    Dispense:  30 tablet    Refill:  0    30 d supply;  ** OV for RF **   Do not send RF request     Prediabetes  Assessment: Condition is new; Not optimized. Labs were reviewed.  Lab Results  Component Value Date   HGBA1C 5.9 (H) 12/24/2022   INSULIN 12.2 12/24/2022  Patient denies having prediabetes in the past. Patient is currently not any medication for prediabetes. This is diet controlled. She states she has had cravings for sweets.  Plan:  Begin Metformin 1 po with lunch daily.   Pt will start with 1/2 tab for 2-4 days and if tolerating well, advance to 1 po qd.  We reviewed the possible side effects of Metformin. - Continue her prudent nutritional plan and continue to advance exercise and cardiovascular fitness as tolerated. - We extensively discussed the importance of decreasing simple carbs and how certain foods they eat will affect their blood sugars - I informed the patient about Dr.Barker, our bariatric psychologist, if she has any challenges with emotional eating in the future.   NAFLD (nonalcoholic fatty liver disease) Assessment: Condition is stable. Lab Results  Component Value Date   ALT 45 (H) 10/28/2022   AST 31 10/28/2022   ALKPHOS 69 10/28/2022   BILITOT 0.5 10/28/2022  On 10/28/22, she had a cmp with Dr. Mosetta Putt (of oncology), which showed her ALT and AST has been historically elevated for the past year and a half.  Recently, levels have improved and this was discussed with pt.   Plan: Continue her prudent nutritional plan and continue to advance exercise and cardiovascular fitness as tolerated.   - pathophysiology of disease discussed with Andrez Grime Kalan. Elevated liver  transaminases with an ALT predominance combined with obesity and insulin resistance is characteristic, but not diagnostic of non-alcoholic fatty liver disease (NAFLD). Treatment includes weight loss, elimination of sweet drinks, including juice, avoidance of high fructose corn syrup, and exercise. As always, avoiding alcohol consumption is important. - Treatment goal: 7-10% reduction of body weight.   - Aerobic activity and resistance training are important to help achieve a healthy body weight BUT also increases peripheral insulin sensitivity, reducing delivery of free fatty acids and glucose to the liver.  Also recommended exercise to increase intrahepatic fatty acid oxidation, decrease fatty acid synthesis, and help prevent mitochondrial and hepatocellular damage. - Medications that can help reduce hepatic fat include metformin and GLP-1's, which were discussed with pt and prescribed if pt agreed to treatment.      Hyperlipidemia Assessment: Condition is Not optimized.  Labs were reviewed. Lab Results  Component Value Date   CHOL 214 (H) 12/24/2022   HDL 52 12/24/2022   LDLCALC 127 (H) 12/24/2022   TRIG 199 (H) 12/24/2022   CHOLHDL 3.5 10/27/2020  She reports good compliance and tolerance with Crestor 5 mg daily.  Denies any side effects. She started Crestor in the end of 2021.  Plan:  - LDL and TG above goal; encouraged pt to speak with /follow up with her PCP regarding dosing of her Crestor for better control of her cholesterol levels - I stressed the  importance that patient continue with our prudent nutritional plan that is low in saturated and trans fats, and low in fatty carbs to improve these numbers.  - We will continue routine screening as patient continues to achieve health goals along their weight loss journey     Vitamin D deficiency Assessment: Condition is stable. Labs were reviewed. Lab Results  Component Value Date   VD25OH 58.3 12/24/2022  Patient is compliant with OTC  Vitamin D3 PO. She does not recall the exact dosage. Denies any side effects.  Plan:  Vit d level is at goal.  Patient will tell us the exact dosage of Vitamin D she is taking on the next office visit. Continue with supplement. - weight loss will likely improve availability of vitamin D, thus encouraged Damyia to continue with meal plan and their weight loss efforts to further improve this condition.  Thus, we will need to monitor levels regularly (every 3-4 mo on average) to keep levels within normal limits and prevent over supplementation.   Elevated blood pressure reading Assessment: Condition is stable and improved form prior. Last 3 blood pressure readings in our office are as follows: BP Readings from Last 3 Encounters:  01/14/23 131/83  12/24/22 (!) 144/75  12/11/22 (!) 120/45   Lab Results  Component Value Date   TSH 2.930 12/24/2022   FREET4 1.10 12/24/2022  Patient is currently not on any medication for blood pressure. No concerns. Asymptomatic.    Plan: Lifestyle changes such as following our low salt, heart healthy meal plan and engaging in a regular exercise program discussed  - Avoid buying foods that are: processed, frozen, or prepackaged to avoid excess salt. - Ambulatory blood pressure monitoring encouraged.   - We will continue to monitor closely alongside PCP/ specialists.  Pt reminded to also f/up with those individuals as instructed by them.    TREATMENT PLAN FOR OBESITY: Obesity with current bmi 33.2 (start bmi 33.41/date 12/24/22) Assessment: Condition is Improving, but not optimized. Biometric data collected today, was reviewed with patient.  Fat mass has decreased by 2.2lb. Muscle mass has increased by 0.2lb. Total body water has decreased by .8lb. She states that it has been a challenge mentally to follow  the meal plan closely.   Plan: Continue with  the Category 3 meal plan with breakfast options Counseling was done on: I reviewed the breakfast options  handout with her.  Behavioral Intervention Additional resources provided today: category 3 meal plan information, breakfast options, prediabetes and insulin resistance handout, metformin handout.  Evidence-based interventions for health behavior change were utilized today including the discussion of self monitoring techniques, problem-solving barriers and SMART goal setting techniques.   Regarding patient's less desirable eating habits and patterns, we employed the technique of small changes.  Pt will specifically work on: continuing to follow the Category 3 meal plan with breakfast options for next visit.    Recommended Physical Activity Goals Shadreka has been advised to work up to 150 minutes of moderate intensity aerobic activity a week and strengthening exercises 2-3 times per week for cardiovascular health, weight loss maintenance and preservation of muscle mass.  She has agreed to Continue current level of physical activity   FOLLOW UP: Return in about 3 weeks (around 02/04/2023). with Dr Marquis Lunch.  She was informed of the importance of frequent follow up visits to maximize her success with intensive lifestyle modifications for her multiple health conditions.   Subjective:   Chief complaint: Obesity Nasir is here to discuss her  progress with her obesity treatment plan. She is on the the Category 3 Plan and states she is following her eating plan approximately 20 % of the time. She states she is exercising 30-60 minutes 4 days per week.  Interval History:  RONNAE OSMOND is here today for her first follow-up office visit since starting the program with [place].  Pt was away from home/ on vacation and not able to follow plan closely. She tried to make healthy choices and increase activity.  She states that it has been a challenge mentally to follow the meal plan and thinks she needs a medicine to help her lose weight.  Still skips breakfast at times.  Wasn't weighing or measuring.   All blood work/  lab tests that were recently ordered by myself or an outside provider were reviewed with patient today per their request. Extended time was spent counseling her on all new disease processes that were discovered or preexisting ones that are affected by BMI.  she understands that many of these abnormalities will need to monitored regularly along with the current treatment plan of prudent dietary changes, in which we are making each and every office visit, to improve these health parameters.  Review of Systems:  Pertinent positives were addressed with patient today.   Weight Summary and Biometrics   Weight Lost Since Last Visit: 2lb  No data recorded  Vitals Temp: 99.1 F (37.3 C) BP: 131/83 Pulse Rate: 81 SpO2: 94 %   Anthropometric Measurements Height: 5\' 6"  (1.676 m) Weight: 205 lb 6.4 oz (93.2 kg) BMI (Calculated): 33.17 Weight at Last Visit: 207lb Weight Lost Since Last Visit: 2lb Starting Weight: 207lb Total Weight Loss (lbs): 2 lb (0.907 kg) Peak Weight: 220lb   Body Composition  Body Fat %: 43.8 % Fat Mass (lbs): 90 lbs Muscle Mass (lbs): 109.6 lbs Total Body Water (lbs): 75.2 lbs Visceral Fat Rating : 12   Other Clinical Data Fasting: no Labs: no Today's Visit #: 2 Starting Date: 12/24/22    Objective:   PHYSICAL EXAM:  Blood pressure 131/83, pulse 81, temperature 99.1 F (37.3 C), height 5\' 6"  (1.676 m), weight 205 lb 6.4 oz (93.2 kg), SpO2 94 %. Body mass index is 33.15 kg/m.  General: Well Developed, well nourished, and in no acute distress.  HEENT: Normocephalic, atraumatic Skin: Warm and dry, cap RF less 2 sec, good turgor Chest:  Normal excursion, shape, no gross abn Respiratory: speaking in full sentences, no conversational dyspnea NeuroM-Sk: Ambulates w/o assistance, moves * 4 Psych: A and O *3, insight good, mood-full  DIAGNOSTIC DATA REVIEWED:  BMET    Component Value Date/Time   NA 139 10/28/2022 0900   NA 140 06/16/2020 0957   K  4.2 10/28/2022 0900   CL 107 10/28/2022 0900   CO2 27 10/28/2022 0900   GLUCOSE 91 10/28/2022 0900   BUN 16 10/28/2022 0900   BUN 11 06/16/2020 0957   CREATININE 0.75 10/28/2022 0900   CALCIUM 9.5 10/28/2022 0900   GFRNONAA >60 10/28/2022 0900   GFRAA 113 06/16/2020 0957   Lab Results  Component Value Date   HGBA1C 5.9 (H) 12/24/2022   Lab Results  Component Value Date   INSULIN 12.2 12/24/2022   Lab Results  Component Value Date   TSH 2.930 12/24/2022   CBC    Component Value Date/Time   WBC 5.8 10/28/2022 0900   WBC 5.5 05/20/2022 0929   RBC 4.80 10/28/2022 0900   HGB 14.4 10/28/2022 0900  HGB 14.2 06/16/2020 0957   HCT 42.2 10/28/2022 0900   HCT 43.5 06/16/2020 0957   PLT 294 10/28/2022 0900   MCV 87.9 10/28/2022 0900   MCV 88 06/16/2020 0957   MCH 30.0 10/28/2022 0900   MCHC 34.1 10/28/2022 0900   RDW 12.2 10/28/2022 0900   RDW 13.0 06/16/2020 0957   Iron Studies    Component Value Date/Time   FERRITIN 99 04/01/2022 1553   Lipid Panel     Component Value Date/Time   CHOL 214 (H) 12/24/2022 0925   TRIG 199 (H) 12/24/2022 0925   HDL 52 12/24/2022 0925   CHOLHDL 3.5 10/27/2020 1106   LDLCALC 127 (H) 12/24/2022 0925   Hepatic Function Panel     Component Value Date/Time   PROT 6.9 10/28/2022 0900   PROT 7.2 10/27/2020 1106   ALBUMIN 4.1 10/28/2022 0900   ALBUMIN 4.6 10/27/2020 1106   AST 31 10/28/2022 0900   ALT 45 (H) 10/28/2022 0900   ALKPHOS 69 10/28/2022 0900   BILITOT 0.5 10/28/2022 0900   BILIDIR 0.16 10/27/2020 1106   IBILI 0.5 02/15/2014 1234      Component Value Date/Time   TSH 2.930 12/24/2022 0925   Nutritional Lab Results  Component Value Date   VD25OH 58.3 12/24/2022    Attestations:   Reviewed by clinician on day of visit: allergies, medications, problem list, medical history, surgical history, family history, social history, and previous encounter notes.   Patient was in the office today and time spent on visit  including pre-visit chart review and post-visit care/coordination of care and electronic medical record documentation was 45 minutes. 50% of the time was in face to face counseling of this patient's medical condition(s) and providing education on treatment options to include the first-line treatment of diet and lifestyle modification.  I,Special Puri,acting as a Neurosurgeonscribe for Marsh & McLennanDeborah Cleta Heatley, DO.,have documented all relevant documentation on the behalf of Thomasene LotDeborah Manjinder Breau, DO,as directed by  Thomasene Loteborah Kandiss Ihrig, DO while in the presence of Thomasene Loteborah Jaziel Bennett, DO.   I, Thomasene Loteborah Iriel Nason, DO, have reviewed all documentation for this visit. The documentation on 01/14/23 for the exam, diagnosis, procedures, and orders are all accurate and complete.

## 2023-01-15 ENCOUNTER — Other Ambulatory Visit: Payer: Self-pay

## 2023-01-24 ENCOUNTER — Encounter: Payer: Self-pay | Admitting: Nurse Practitioner

## 2023-01-24 ENCOUNTER — Ambulatory Visit: Payer: BC Managed Care – PPO | Admitting: Nurse Practitioner

## 2023-01-24 VITALS — BP 109/75 | HR 80 | Temp 97.5°F | Wt 203.0 lb

## 2023-01-24 DIAGNOSIS — Z6835 Body mass index (BMI) 35.0-35.9, adult: Secondary | ICD-10-CM | POA: Diagnosis not present

## 2023-01-24 DIAGNOSIS — E782 Mixed hyperlipidemia: Secondary | ICD-10-CM

## 2023-01-24 NOTE — Progress Notes (Unsigned)
   Subjective:    Patient ID: [name], female    DOB: 1960-11-21, 62 y.o.   MRN: [id number]  HPI Presents to discuss her recent weight loss efforts.  Is being followed by [facility] in [place].  Has been following their diet program which includes high protein and low sugar.  Has been very active with walking and participating in some programs including wall Pilates.  According to patient she has lost 17 pounds on the program.  Has also been placed on metformin for prediabetes.  Her provider there has discussed increasing her rosuvastatin to daily dosage.  Denies any adverse effects.  Review of Systems  Constitutional:  Negative for fatigue.  Respiratory:  Negative for cough, chest tightness, shortness of breath and wheezing.   Cardiovascular:  Negative for chest pain and leg swelling.      01/24/2023   11:11 AM  Depression screen PHQ 2/9  Decreased Interest 0  Down, Depressed, Hopeless 0  PHQ - 2 Score 0  Altered sleeping 0  Tired, decreased energy 0  Change in appetite 0  Feeling bad or failure about yourself  0  Trouble concentrating 0  Moving slowly or fidgety/restless 0  Suicidal thoughts 0  PHQ-9 Score 0  Difficult doing work/chores Not difficult at all      01/24/2023   11:11 AM  GAD 7 : Generalized Anxiety Score  Nervous, Anxious, on Edge 0  Control/stop worrying 0  Worry too much - different things 0  Trouble relaxing 0  Restless 0  Easily annoyed or irritable 0  Afraid - awful might happen 0  Total GAD 7 Score 0  Anxiety Difficulty Not difficult at all         Objective:   Physical Exam NAD.  Alert, oriented.  Calm cheerful affect.  Making good eye contact.  Dressed appropriately for the weather.  Thoughts logical coherent and relevant.  Lungs clear.  Heart regular rate rhythm.  Lower extremities no edema.  The 10-year ASCVD risk score (Arnett DK, et al., 2019) is: 2.6%   Values used to calculate the score:     Age: 53  years     Sex: Female     Is Non-Hispanic African American: No     Diabetic: No     Tobacco smoker: No     Systolic Blood Pressure: 109 mmHg     Is BP treated: No     HDL Cholesterol: 52 mg/dL     Total Cholesterol: 214 mg/dL      Assessment & Plan:   Problem List Items Addressed This Visit       Other   BMI 35.0-35.9,adult   Mixed hyperlipidemia - Primary   Relevant Medications   rosuvastatin (CRESTOR) 5 MG tablet   Meds ordered this encounter  Medications   rosuvastatin (CRESTOR) 5 MG tablet    Sig: Take one tab po qd for cholesterol    Dispense:  90 tablet    Refill:  0    Order Specific Question:   Supervising Provider    Answer:   Babs Sciara [phone]   Encourage patient to take rosuvastatin 5 mg daily especially due to recent history of prediabetes and fatty liver disease.  Encouraged continued healthy diet activity and weight loss efforts. Return in about 6 months (around 07/26/2023).

## 2023-01-25 ENCOUNTER — Encounter: Payer: Self-pay | Admitting: Nurse Practitioner

## 2023-01-25 MED ORDER — ROSUVASTATIN CALCIUM 5 MG PO TABS: ORAL_TABLET | ORAL | 0 refills | Status: DC

## 2023-01-29 ENCOUNTER — Ambulatory Visit: Payer: BC Managed Care – PPO | Admitting: Gastroenterology

## 2023-01-29 ENCOUNTER — Encounter: Payer: Self-pay | Admitting: Gastroenterology

## 2023-01-29 VITALS — BP 120/80 | HR 82 | Ht 66.0 in | Wt 203.0 lb

## 2023-01-29 DIAGNOSIS — R059 Cough, unspecified: Secondary | ICD-10-CM

## 2023-01-29 DIAGNOSIS — K2100 Gastro-esophageal reflux disease with esophagitis, without bleeding: Secondary | ICD-10-CM | POA: Diagnosis not present

## 2023-01-29 DIAGNOSIS — C562 Malignant neoplasm of left ovary: Secondary | ICD-10-CM | POA: Diagnosis not present

## 2023-01-29 DIAGNOSIS — Z86010 Personal history of colonic polyps: Secondary | ICD-10-CM

## 2023-01-29 MED ORDER — PANTOPRAZOLE SODIUM 20 MG PO TBEC: 20.0000 mg | DELAYED_RELEASE_TABLET | Freq: Every day | ORAL | 1 refills | Status: DC

## 2023-01-29 NOTE — Patient Instructions (Addendum)
You have been scheduled for a colonoscopy. Please follow written instructions given to you at your visit today.  Please pick up your prep supplies at the pharmacy within the next 1-3 days. If you use inhalers (even only as needed), please bring them with you on the day of your procedure.   We have sent the following medications to your pharmacy for you to pick up at your convenience:  Protonix   _______________________________________________________  If your blood pressure at your visit was 140/90 or greater, please contact your primary care physician to follow up on this.  _______________________________________________________  If you are age 28 or younger, your body mass index should be between 19-25. Your Body mass index is 32.77 kg/m. If this is out of the aformentioned range listed, please consider follow up with your Primary Care Provider.   __________________________________________________________  The [facility] providers would like to encourage you to use [facility] to communicate with providers for non-urgent requests or questions.  Due to long hold times on the telephone, sending your provider a message by [facility] may be a faster and more efficient way to get a response.  Please allow 48 business hours for a response.  Please remember that this is for non-urgent requests.

## 2023-01-29 NOTE — Progress Notes (Signed)
Chief Complaint:    GERD  GI History: 62 year old female with history of left ovarian adenocarcinoma    - 12/13/2021: CT abdomen/pelvis: Normal liver parenchyma with 1.5 cm hyperenhancing lesion in the right hepatic lobe, normal pancreas, GI tract.  No lymphadenopathy.  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  Otherwise normal visualized GI tract.  Recommend repeat MRI liver with and without Eovist in 6 months - 03/12/2022: Robotic-assisted laparoscopic unilateral salpingo-oophorectomy, tumor staging including peritoneal biopsies, infracolic omentectomy, oversew of ascending colon serosa - Pathology from left fallopian tube and ovary with adenocarcinoma, most consistent with metastatic colorectal adenocarcinoma - Remainder of the resected segments were otherwise benign - 03/27/2022: Evaluated in the GI clinic for expedited colonoscopy.   - 03/28/2022: Colonoscopy: 2 subcentimeter sigmoid tubular adenomas, otherwise normal colon.  Normal TI.  Small grade 1 internal hemorrhoids.  Repeat colonoscopy in 5 years - 04/01/2022: CEA normal - 04/10/2022: PET/CT: No hypermetabolic disease - 04/15/2022: CT enterography: Normal - 7/25 - 9/26, 2023: Completed 4 cycles of CAPEOX and 1 cycle of Xeloda - 10/28/2022: CT C/A/P: Mild symmetric distal esophageal wall thickening, sigmoid diverticulosis, otherwise normal GI tract.  Hepatic steatosis without hepatic lesion.  Cholelithiasis without cholecystitis.  Prior left salpingo-oophorectomy without evidence of local recurrence or metastatic disease - 12/11/2022: EGD: Mild esophagitis in the lower esophagus (path: Reflux changes), Otherwise normal.  Increase Protonix to 40 mg twice daily x 4 weeks   Endoscopic History: - Colonoscopy (06/09/2014): 2 small sigmoid polyps removed with forceps (path  benign), otherwise normal colon  - 03/28/2022: Colonoscopy: 2 subcentimeter sigmoid tubular adenomas, otherwise normal colon.  Normal TI.  Small grade 1 internal hemorrhoids.  Repeat colonoscopy in 5 years - 12/11/2022: EGD: Mild esophagitis in the lower esophagus (path: Reflux changes), Otherwise normal.  Increase Protonix to 40 mg twice daily x 4 weeks  HPI:     Patient is a 62 y.o. female presenting to the Gastroenterology Clinic for follow-up.  Last seen by me in the office on 12/05/2022 for evaluation of distal esophageal wall thickening noted on CT.  Was also having chronic cough without heartburn, regurgitation.  Was evaluated with EGD which was notable for esophagitis in the lower esophagus.  Increased Protonix to 40 mg twice daily with plan to titrate back down to daily dosing after 4 weeks.  She states her cough has since resolved on high-dose PPI.  Has not yet titrated down to daily dosing.  Separately, she has significant reservations about waiting too long for repeat colonoscopy.  Her concern stems from the adenocarcinoma that was initially thought to be colonic in origin during her ovary resection.  Does have a history of adenomatous polyps, and she is worried about development of colorectal cancer.   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     Cholecalciferol (VITAMIN D3 PO) Take 1 tablet by mouth daily.     citalopram (CELEXA) 20 MG tablet Take 1 tablet (20 mg total) by mouth daily. 90 tablet 0   ibuprofen (ADVIL) 800 MG tablet Take 1 tablet (800 mg total) by mouth every 8 (eight) hours as needed for moderate pain. For AFTER surgery only 30 tablet 0   loratadine (CLARITIN) 10 MG tablet Take 10 mg by mouth daily.     metFORMIN (GLUCOPHAGE) 500 MG tablet 1 po with lunch daily 30 tablet 0   pantoprazole (PROTONIX) 40 MG tablet Take 1 tablet (40 mg total) by mouth 2 (two) times daily. Take twice a day for 4 weeks then decrease back to once daily 90 tablet 3   rosuvastatin (CRESTOR) 5 MG tablet Take one tab po qd for cholesterol 90 tablet 0   No current facility-administered medications for this visit.    Physical Exam:     BP 120/80   Pulse 82   Ht  (1.676 m)   Wt 203 lb (92.1 kg)   BMI 32.77 kg/m   GENERAL:  Pleasant female in NAD PSYCH: : Cooperative, normal affect NEURO: Alert and oriented x 3, no focal neurologic deficits   IMPRESSION and PLAN:    1) GERD with erosive esophagitis 2) Chronic cough Only reflux manifestation is dry cough, which seems to be well-controlled since going up to high-dose PPI therapy.  EGD with mild esophagitis in the lower esophagus and pathology consistent with reflux changes.  Discussed the largely silent nature of her reflux with plan as follows: - Reduce Protonix to 40 mg daily.  If symptoms still well-controlled on daily dosing after 2 weeks, will reduce to 20 mg daily in an effort to get to the lowest effective dose - Asked her to message me in about 2 weeks to update me on how she does with PPI wean - Discussed pathophysiology of reflux today at length  3) History of ovarian adenocarcinoma 4) History of adenomatous polyps of the colon We discussed surveillance recommendations  at length today.  She has significant reservations about waiting too long for repeat colonoscopy given the pathology found during her salpingo-oophorectomy which demonstrated adenocarcinoma with features of colonic origin.  She has heightened concerns about development of colorectal cancer given that pathology result and her history of adenomatous polyps.  Given her complex history, we agreed to initiate surveillance sooner - Plan for colonoscopy later this year.  If unremarkable, can liberalize surveillance intervals  I spent over 35 minutes of time, including in depth chart review, independent review of results as outlined above, communicating results with the patient directly, face-to-face time with the patient, coordinating care, and ordering studies and medications as appropriate, and documentation.           Shellia Cleverly ,DO, FACG 01/29/2023, 3:53 PM

## 2023-01-30 ENCOUNTER — Inpatient Hospital Stay: Payer: BC Managed Care – PPO | Admitting: Hematology

## 2023-01-30 ENCOUNTER — Encounter: Payer: Self-pay | Admitting: Hematology

## 2023-01-30 ENCOUNTER — Other Ambulatory Visit: Payer: Self-pay | Admitting: Family Medicine

## 2023-01-30 ENCOUNTER — Inpatient Hospital Stay: Payer: BC Managed Care – PPO | Attending: Gynecologic Oncology

## 2023-01-30 ENCOUNTER — Other Ambulatory Visit: Payer: Self-pay

## 2023-01-30 VITALS — BP 115/60 | HR 68 | Temp 97.7°F | Resp 16 | Ht 66.0 in | Wt 201.5 lb

## 2023-01-30 DIAGNOSIS — N200 Calculus of kidney: Secondary | ICD-10-CM | POA: Diagnosis not present

## 2023-01-30 DIAGNOSIS — Z79899 Other long term (current) drug therapy: Secondary | ICD-10-CM | POA: Insufficient documentation

## 2023-01-30 DIAGNOSIS — Z8673 Personal history of transient ischemic attack (TIA), and cerebral infarction without residual deficits: Secondary | ICD-10-CM | POA: Diagnosis not present

## 2023-01-30 DIAGNOSIS — C562 Malignant neoplasm of left ovary: Secondary | ICD-10-CM | POA: Insufficient documentation

## 2023-01-30 DIAGNOSIS — K760 Fatty (change of) liver, not elsewhere classified: Secondary | ICD-10-CM | POA: Insufficient documentation

## 2023-01-30 DIAGNOSIS — K635 Polyp of colon: Secondary | ICD-10-CM | POA: Diagnosis not present

## 2023-01-30 DIAGNOSIS — K5730 Diverticulosis of large intestine without perforation or abscess without bleeding: Secondary | ICD-10-CM | POA: Diagnosis not present

## 2023-01-30 DIAGNOSIS — E785 Hyperlipidemia, unspecified: Secondary | ICD-10-CM | POA: Diagnosis not present

## 2023-01-30 DIAGNOSIS — K219 Gastro-esophageal reflux disease without esophagitis: Secondary | ICD-10-CM | POA: Diagnosis not present

## 2023-01-30 DIAGNOSIS — Z7984 Long term (current) use of oral hypoglycemic drugs: Secondary | ICD-10-CM | POA: Diagnosis not present

## 2023-01-30 LAB — CMP (CANCER CENTER ONLY)
ALT: 23 U/L (ref 0–44)
AST: 21 U/L (ref 15–41)
Albumin: 4.4 g/dL (ref 3.5–5.0)
Alkaline Phosphatase: 55 U/L (ref 38–126)
Anion gap: 6 (ref 5–15)
BUN: 16 mg/dL (ref 6–20)
CO2: 28 mmol/L (ref 22–32)
Calcium: 9.9 mg/dL (ref 8.9–10.3)
Chloride: 107 mmol/L (ref 98–111)
Creatinine: 0.84 mg/dL (ref 0.44–1.00)
GFR, Estimated: >60 mL/min (ref >60)
Glucose, Bld: 94 mg/dL (ref 70–99)
Potassium: 3.9 mmol/L (ref 3.5–5.1)
Sodium: 141 mmol/L (ref 135–145)
Total Bilirubin: 0.6 mg/dL (ref 0.3–1.2)
Total Protein: 7.3 g/dL (ref 6.5–8.1)

## 2023-01-30 LAB — CBC WITH DIFFERENTIAL (CANCER CENTER ONLY)
Abs Immature Granulocytes: 0.01 10*3/uL (ref 0.00–0.07)
Basophils Absolute: 0 10*3/uL (ref 0.0–0.1)
Basophils Relative: 0 %
Eosinophils Absolute: 0.3 10*3/uL (ref 0.0–0.5)
Eosinophils Relative: 7 %
HCT: 42.5 % (ref 36.0–46.0)
Hemoglobin: 14.3 g/dL (ref 12.0–15.0)
Immature Granulocytes: 0 %
Lymphocytes Relative: 37 %
Lymphs Abs: 1.7 10*3/uL (ref 0.7–4.0)
MCH: 29.1 pg (ref 26.0–34.0)
MCHC: 33.6 g/dL (ref 30.0–36.0)
MCV: 86.4 fL (ref 80.0–100.0)
Monocytes Absolute: 0.3 10*3/uL (ref 0.1–1.0)
Monocytes Relative: 7 %
Neutro Abs: 2.2 10*3/uL (ref 1.7–7.7)
Neutrophils Relative %: 49 %
Platelet Count: 282 10*3/uL (ref 150–400)
RBC: 4.92 MIL/uL (ref 3.87–5.11)
RDW: 13.4 % (ref 11.5–15.5)
WBC Count: 4.7 10*3/uL (ref 4.0–10.5)
nRBC: 0 % (ref 0.0–0.2)

## 2023-01-30 NOTE — Assessment & Plan Note (Signed)
Stage IIB -presented with acute left abdominal pain. CT AP on 12/13/21 in ED showed a 6.6 cm hemorrhagic mass. Baseline CA 125 from 01/03/22 was WNL at 9.7. her pain resolved and no recurrent or other symptoms -S/p left salpingo-oophorectomy on 03/12/22 with Dr. Pricilla Holm. 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 I spoke with pathologist Dr. 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  -colonoscopy on 03/28/22 with Dr. Barron Alvine showed no suspicious mass, only polyps.  -baseline CEA on 04/01/22 was WNL.  -PET scan on 04/10/22 showed: no convincing evidence of hypermetabolic disease; postsurgical hypermetabolism in left adnexa and abdominal wall. -enterography on 04/15/22 showed: no small bowel masses or nodularity or tumor along omentum or mesentery; no definite appendiceal tumor. -she completed 4 cycles adjuvant CAPEOX 04/30/22 - 07/02/22 and one additional cycle of Xeloda alone.   -Surveillance CT scan from October 28, 2022 showed no evidence of recurrence

## 2023-01-30 NOTE — Progress Notes (Signed)
[facility]   [phone] [phone]   Clinic Follow up Note   Patient Care Team: Babs Sciara, MD as PCP - General (Family Medicine) Ok Edwards, MD (Inactive) as Consulting Physician (Obstetrics and Gynecology) Shirlean Kelly, MD as Consulting Physician (Neurosurgery) Malissa Hippo, MD as Consulting Physician (Gastroenterology) Malachy Mood, MD as Consulting Physician (Oncology)  Date of Service:  01/30/2023  CHIEF COMPLAINT: f/u of ovarian adenocarcinoma    CURRENT THERAPY:  Surveillance   ASSESSMENT:  [name] is a 62 y.o. female with   Adenocarcinoma (epithelial) of ovary, left (HCC) Stage IIB -presented with acute left abdominal pain. CT [facility] on 12/13/21 in ED showed a 6.6 cm hemorrhagic mass. Baseline CA 125 from 01/03/22 was WNL at 9.7. her pain resolved and no recurrent or other symptoms -S/p left salpingo-oophorectomy on 03/12/22 with Dr. Pricilla Holm. 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 I spoke with pathologist Dr. 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  -colonoscopy on 03/28/22 with Dr. Barron Alvine showed no suspicious mass, only polyps.  -baseline CEA on 04/01/22 was WNL.  -PET scan on 04/10/22 showed: no convincing evidence of hypermetabolic disease; postsurgical hypermetabolism in left adnexa and abdominal wall. -enterography on 04/15/22 showed: no small bowel masses or nodularity or tumor along omentum or mesentery; no definite appendiceal tumor. -she completed 4 cycles adjuvant CAPEOX 04/30/22 - 07/02/22 and one additional cycle of Xeloda alone.   -Surveillance CT scan from October 28, 2022 showed no evidence of recurrence -She is clinically doing well, exam was unremarkable, lab reviewed, no clinical concern for recurrence. -Follow-up in 4 months with lab  and CT scan.   PLAN: -Lab reviewed -order Ct scan in 3 months -Colonoscopy schedule for 7/2 -lab and f/u in 3 months  SUMMARY OF ONCOLOGIC HISTORY: Oncology History Overview Note   Cancer Staging  Adenocarcinoma (epithelial) of ovary, left Staging form: Ovary, Fallopian Tube, and Primary Peritoneal Carcinoma, AJCC 8th Edition - Clinical stage from 04/17/2022: FIGO Stage IIB (cT2b, cN0, cM0) - Signed by Malachy Mood, MD on 04/22/2022 Stage prefix: Initial diagnosis Histologic grade (G): GX Histologic grading system: 4 grade system     Adenocarcinoma (epithelial) of ovary, left  12/13/2021 Imaging   EXAM: CT ABDOMEN AND PELVIS WITH CONTRAST  IMPRESSION: 1. 6.6 cm hyperdense left adnexal/ovarian mass, likely a hemorrhagic cyst/mass. Recommend follow-up pelvic ultrasound. 2. 1.5 cm hyperenhancing lesion in the right hepatic lobe. Most likely differentials include hemangioma, FNH, adenoma. Consider nonemergent follow-up MRI abdomen with contrast.   01/03/2022 Tumor Marker   CA 125: 9.7 (WNL)   01/14/2022 Imaging   EXAM: MRI ABDOMEN WITHOUT AND WITH CONTRAST  IMPRESSION: Enhancing 13 mm segment VI hepatic lesion measuring 13 mm demonstrates nonspecific imaging characteristics but which is favored to reflect a benign etiology such as focal nodular hyperplasia or a hepatic adenoma. Recommend follow-up MRI in 6 months with and without EOVIST contrast for more definitive characterization and to assess stability.   03/12/2022 Pathology Results   FINAL MICROSCOPIC DIAGNOSIS:   A.   FALLOPIAN TUBE AND OVARY, LEFT, SALPINGO OOPHORECTOMY:  -    Adenocarcinoma, most consistent with metastatic colorectal  adenocarcinoma, see Comment.  -    Fallopian tube, negative for malignancy/intraepithelial carcinoma (STIC).   B.   PELVIC SIDEWALL, RIGHT, BIOPSY:  -    Benign, fibroelastic  nodule.  -    Negative for malignancy.   C.   CUL DE SAC, POSTERIOR, BIOPSY:  -    Negative for malignancy.    D.   CUL DE SAC, ANTERIOR, BIOPSY:  -    Negative for malignancy.   E.   PELVIC SIDEWALL, LEFT, BIOPSY:  -    Negative for malignancy.   F.   PARACOLIC GUTTER, LEFT, BIOPSY:  -    Negative for malignancy.   G.   PARACOLIC GUTTER, RIGHT, BIOPSY:  -    Negative for malignancy.   H.   OMENTUM:  -    Negative for malignancy.   I.   OVARY REMNANT, LEFT, EXCISION:  -    Adenocarcinoma, most consistent with metastatic colorectal  adenocarcinoma.   COMMENT:  The tumor was interrogated with immunohistochemical (IHC) stains.  The tumor cells are diffusely and strongly positive for CDX-2, CK20 and mCEA and the tumor cells are CK7 negative.   This immunoprofile strongly favors metastatic colorectal adenocarcinoma.   The tumor cells are PAX8 negative to focally equivocal, a finding which is non-contributory. Primary ovarian mucinous adenocarcinoma can be CK20, CDX-2 and mCEA positive; although the staining pattern is typically focal and not diffuse, as in this case.  Also, supportive of a metastasis is CK7 negativity (primary ovarian mucinous carcinoma is typically CK7 positive).   There are rare cells with intracytoplasmic mucin  (mucicarmine stain).   The best IHC stain to differentiate between metastatic colorectal  adenocarcinoma and primary ovarian mucinous adenocarcinoma is SATB2. This marker has been ordered and will be performed at [facility].  The result will be reported in an addendum.   Furthermore, the tumor cells are negative for serous markers (p16, p53 (wildtype), WT-1) and negative for endometrioid markers (ER and vimentin).  The Ki-67 mitotic index is high.  The IHC stains and mucicarmine stain have satisfactory controls.    ADDENDUM:  The tumor was interrogated with SATB2 immunohistochemical (IHC) stain, performed at [facility].  The tumor has diffuse strong nuclear reactivity.  This result, in combination with the prior IHC results, is essentially diagnostic of a lower GI  (colorectal) or appendiceal primary tumor.  The control is satisfactory.    03/28/2022 Procedure   Colonoscopy, Dr. Barron Alvine  Impression: - Two 2 to 3 mm polyps in the sigmoid colon, removed with a cold snare. Resected and retrieved. - The remainder of the colon was normal, including retoflexed views of the right colon. - The examined portion of the ileum was normal. - Non-bleeding internal hemorrhoids.   04/01/2022 Initial Diagnosis   Adenocarcinoma (epithelial) of ovary, left (HCC)   04/17/2022 Cancer Staging   Staging form: Ovary, Fallopian Tube, and Primary Peritoneal Carcinoma, AJCC 8th Edition - Clinical stage from 04/17/2022: FIGO Stage IIB (cT2b, cN0, cM0) - Signed by Malachy Mood, MD on 04/22/2022 Stage prefix: Initial diagnosis Histologic grade (G): GX Histologic grading system: 4 grade system   04/29/2022 -  Chemotherapy   Patient is on Treatment Plan : COLORECTAL Xelox (Capeox)(130/850) q21d     04/30/2022 - 05/20/2022 Chemotherapy   Patient is on Treatment Plan : COLORECTAL Xelox (Capeox) q21d     07/09/2022 Genetic Testing   Negative hereditary cancer genetic testing: no pathogenic variants detected in [facility] CancerNextExpanded +RNAinsight Panel.  Report date is 07/09/2022.   The CancerNext-Expanded gene panel offered by [facility] and includes sequencing, rearrangement, and RNA analysis for the following 77 genes: AIP, ALK, APC, ATM, AXIN2, BAP1, BARD1, BLM, BMPR1A, BRCA1,  BRCA2, BRIP1, CDC73, CDH1, CDK4, CDKN1B, CDKN2A, CHEK2, CTNNA1, DICER1, FANCC, FH, FLCN, GALNT12, KIF1B, LZTR1, MAX, MEN1, MET, MLH1, MSH2, MSH3, MSH6, MUTYH, NBN, NF1, NF2, NTHL1, PALB2, PHOX2B, PMS2, POT1, PRKAR1A, PTCH1, PTEN, RAD51C, RAD51D, RB1, RECQL, RET, SDHA, SDHAF2, SDHB, SDHC, SDHD, SMAD4, SMARCA4, SMARCB1, SMARCE1, STK11, SUFU, TMEM127, TP53, TSC1, TSC2, VHL and XRCC2 (sequencing and deletion/duplication); EGFR, EGLN1, HOXB13, KIT, MITF, PDGFRA, POLD1, and POLE (sequencing only); EPCAM and GREM1  (deletion/duplication only).   HRD testing is pending.    10/28/2022 Imaging    IMPRESSION: 1. Prior left salpingo-oophorectomy without evidence of local recurrence or metastatic disease within the chest, abdomen, or pelvis. 2. Mild symmetric distal esophageal wall thickening, correlate for symptoms of esophagitis. 3. Diffuse hepatic steatosis. 4. Cholelithiasis without findings of acute cholecystitis. 5. Nonobstructive 2 mm right renal calculus. 6. Sigmoid colonic diverticulosis without findings of acute diverticulitis.      INTERVAL HISTORY:  [name] is here for a follow up of ovarian adenocarcinoma  She was last seen by me on 10/31/2022.She presents to the clinic accompanied by husband.Pt stated that she had her GI f/u on 4/24. Pt has gotten in with the Weight Management Program.Pt state since she has been losing weight she had some discomfort in her lower abdomen.  .   All other systems were reviewed with the patient and are negative.  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   BMI 35.0-35.9,adult    Cancer 03/12/2022   colon adenoca on ovary   Fatty liver    Gall stone    GERD (gastroesophageal reflux disease)    High blood pressure    History of stroke    at or after childbirth   Hyperlipidemia    Normal spontaneous vaginal delivery    2   Ovarian cancer    Pneumonia    Pre-eclampsia     SURGICAL HISTORY: Past Surgical History:  Procedure Laterality Date   BREAST BIOPSY Left 07/15/2017   2 masses   BUNIONECTOMY  08/2021   COLONOSCOPY N/A 06/09/2014   Procedure: COLONOSCOPY;  Surgeon: Malissa Hippo, MD;  Location: [facility];  Service: Endoscopy;  Laterality: N/A;  830-moved to 945 Ann to notify pt   COLONOSCOPY  03/28/2022   HYSTEROTOMY     LAPAROSCOPIC TOTAL HYSTERECTOMY  07/18/2010   RSO   NECK SURGERY  11/2012    c6 c7   ROBOTIC ASSISTED SALPINGO OOPHERECTOMY Left 03/12/2022   Procedure: XI ROBOTIC ASSISTED LEFT SALPINGO OOPHORECTOMY WITH STAGING;  Surgeon: Carver Fila, MD;  Location: [facility];  Service: Gynecology;  Laterality: Left;    I have reviewed the social history and family history with the patient and they are unchanged from previous note.  ALLERGIES:  is allergic to wound dressing adhesive.  MEDICATIONS:  Current Outpatient Medications  Medication Sig Dispense Refill   BIOTIN PO Take 4 tablets by mouth daily.     Cholecalciferol (VITAMIN D3 PO) Take 1 tablet by mouth daily.     citalopram (CELEXA) 20 MG tablet Take 1 tablet (20 mg total) by mouth daily. 90 tablet 0   ibuprofen (ADVIL) 800 MG tablet Take 1 tablet (800 mg total) by mouth every 8 (eight) hours as needed for moderate pain. For AFTER surgery only 30 tablet 0   loratadine (CLARITIN) 10 MG tablet Take 10 mg by mouth  daily.     metFORMIN (GLUCOPHAGE) 500 MG tablet 1 po with lunch daily 30 tablet 0   pantoprazole (PROTONIX) 20 MG tablet Take 1 tablet (20 mg total) by mouth daily. 90 tablet 1   pantoprazole (PROTONIX) 40 MG tablet Take 1 tablet (40 mg total) by mouth 2 (two) times daily. Take twice a day for 4 weeks then decrease back to once daily 90 tablet 3   rosuvastatin (CRESTOR) 5 MG tablet Take one tab po qd for cholesterol 90 tablet 0   No current facility-administered medications for this visit.    PHYSICAL EXAMINATION: ECOG PERFORMANCE STATUS: 0 - Asymptomatic  Vitals:   01/30/23 1030  BP: 115/60  Pulse: 68  Resp: 16  Temp: 97.7 F (36.5 C)  SpO2: 96%   Wt Readings from Last 3 Encounters:  01/30/23 201 lb 8 oz (91.4 kg)  01/29/23 203 lb (92.1 kg)  01/24/23 203 lb (92.1 kg)     GENERAL:alert, no distress and comfortable SKIN: skin color normal, no rashes or significant lesions EYES: normal, Conjunctiva are pink and non-injected, sclera clear  NEURO: alert & oriented x 3 with fluent  speech NECK: (-)supple, thyroid normal size, non-tender, without nodularity LYMPH:  (-) no palpable lymphadenopathy in the cervical, axillary  LUNGS:(-)  clear to auscultation and percussion with normal breathing effort HEART:(-) regular rate & rhythm and no murmurs and(-) abdomen soft, (-)non-tender and(-)  normal bowel sounds    LABORATORY DATA:  I have reviewed the data as listed    Latest Ref Rng & Units 01/30/2023   10:06 AM 10/28/2022    9:00 AM 07/30/2022   10:18 AM  CBC  WBC 4.0 - 10.5 K/uL 4.7  5.8  3.7   Hemoglobin 12.0 - 15.0 g/dL 16.1  09.6  04.5   Hematocrit 36.0 - 46.0 % 42.5  42.2  37.2   Platelets 150 - 400 K/uL 282  294  176         Latest Ref Rng & Units 01/30/2023   10:06 AM 10/28/2022    9:00 AM 07/30/2022   10:18 AM  CMP  Glucose 70 - 99 mg/dL 94  91  409   BUN 6 - 20 mg/dL Creatinine 0.44 - 1.00 mg/dL 8.11  9.14  7.82   Sodium 135 - 145 mmol/L 141  139  141   Potassium 3.5 - 5.1 mmol/L 3.9  4.2  3.7   Chloride 98 - 111 mmol/L 107  107  108   CO2 22 - 32 mmol/L Calcium 8.9 - 10.3 mg/dL 9.9  9.5  9.2   Total Protein 6.5 - 8.1 g/dL 7.3  6.9  7.0   Total Bilirubin 0.3 - 1.2 mg/dL 0.6  0.5  0.7   Alkaline Phos 38 - 126 U/L 55  69  88   AST 15 - 41 U/L 21  31  43   ALT 0 - 44 U/L 23  45  72       RADIOGRAPHIC STUDIES: I have personally reviewed the radiological images as listed and agreed with the findings in the report. No results found.    Orders Placed This Encounter  Procedures   CT CHEST ABDOMEN PELVIS W CONTRAST    Standing Status:   Future    Standing Expiration Date:   01/30/2024    Order Specific Question:   If indicated for the ordered procedure, I authorize the  administration of contrast media per Radiology protocol    Answer:   Yes    Order Specific Question:   Does the patient have a contrast media/X-ray dye allergy?    Answer:   No    Order Specific Question:   Is patient pregnant?    Answer:   Yes     Order Specific Question:   Preferred imaging location?    Answer:   [facility]    Order Specific Question:   If indicated for the ordered procedure, I authorize the administration of oral contrast media per Radiology protocol    Answer:   Yes   All questions were answered. The patient knows to call the clinic with any problems, questions or concerns. No barriers to learning was detected. The total time spent in the appointment was 25 minutes.     Malachy Mood, MD 01/30/2023   Carolin Coy, CMA, am acting as scribe for Malachy Mood, MD.   I have reviewed the above documentation for accuracy and completeness, and I agree with the above.

## 2023-02-01 ENCOUNTER — Other Ambulatory Visit: Payer: Self-pay

## 2023-02-04 ENCOUNTER — Ambulatory Visit (INDEPENDENT_AMBULATORY_CARE_PROVIDER_SITE_OTHER): Payer: BC Managed Care – PPO | Admitting: Family Medicine

## 2023-02-04 ENCOUNTER — Encounter (INDEPENDENT_AMBULATORY_CARE_PROVIDER_SITE_OTHER): Payer: Self-pay | Admitting: Family Medicine

## 2023-02-04 VITALS — BP 94/66 | HR 69 | Temp 98.3°F | Ht 66.0 in | Wt 198.0 lb

## 2023-02-04 DIAGNOSIS — R7303 Prediabetes: Secondary | ICD-10-CM | POA: Diagnosis not present

## 2023-02-04 DIAGNOSIS — Z6831 Body mass index (BMI) 31.0-31.9, adult: Secondary | ICD-10-CM | POA: Diagnosis not present

## 2023-02-04 DIAGNOSIS — E669 Obesity, unspecified: Secondary | ICD-10-CM | POA: Diagnosis not present

## 2023-02-04 DIAGNOSIS — E7849 Other hyperlipidemia: Secondary | ICD-10-CM | POA: Diagnosis not present

## 2023-02-04 DIAGNOSIS — Z6832 Body mass index (BMI) 32.0-32.9, adult: Secondary | ICD-10-CM

## 2023-02-04 NOTE — Progress Notes (Unsigned)
Chief Complaint:   OBESITY [name] is here to discuss her progress with her obesity treatment plan along with follow-up of her obesity related diagnoses. [name] is on the Category 3 Plan and states she is following her eating plan approximately 100% of the time. [name] states she is walking, exercise class for 45 to 50 minutes 2 times per week.  Today's visit was #: 3 Starting weight: 207 lbs Starting date: 12/24/2022 Today's weight: 198 lbs Today's date: 02/04/2023 Total lbs lost to date: 9 lbs Total lbs lost since last in-office visit: 7 lbs  Interim History: Patient voices she been much more conscious of food choice over the last few weeks.  She has been reading more labels and watching sugar intake.  Trying to ensure she is getting sufficient protein daily.  Has incorporated more sugar free options to ensure she isn't overdoing it on sugar. She hasn't been able to get all the food in- particularly the protein.  She is having a gathering at her home this week and is doing a tuscan artichoke salad and grilled chicken marinated in [place] dressing.  Her daughter is graduating high school (and her daughter won Tour manager).  She is also going to celebrate her mother's 99th birthday in [place] on Mother's Day weekend.   Subjective:   1. Prediabetes Patient last A1c was 5.9, insulin 12.2.  Patient is on metformin 500 mg half tablet at lunch.  2. Other hyperlipidemia Patient is on Crestor, previously 5 mg 3 times per week.  Last LDL 127, HDL 52, triglycerides 199.  Assessment/Plan:   1. Prediabetes Continue metformin.  2. Other hyperlipidemia Follow-up on labs in 2 months.  3. BMI 32.0-32.9,adult  4. Obesity with starting BMI of 33.4 [name] is currently in the action stage of change. As such, her goal is to continue with weight loss efforts. She has agreed to the Category 3 Plan.   Exercise goals: All adults should avoid inactivity. Some physical activity is better than none,  and adults who participate in any amount of physical activity gain some health benefits.  Behavioral modification strategies: increasing lean protein intake, meal planning and cooking strategies, keeping healthy foods in the home, travel eating strategies, and planning for success.  [name] has agreed to follow-up with our clinic in 2 weeks. She was informed of the importance of frequent follow-up visits to maximize her success with intensive lifestyle modifications for her multiple health conditions.   Objective:   Blood pressure 94/66, pulse 69, temperature 98.3 F (36.8 C), height 5\' 6"  (1.676 m), weight 198 lb (89.8 kg), SpO2 97 %. Body mass index is 31.96 kg/m.  General: Cooperative, alert, well developed, in no acute distress. HEENT: Conjunctivae and lids unremarkable. Cardiovascular: Regular rhythm.  Lungs: Normal work of breathing. Neurologic: No focal deficits.   Lab Results  Component Value Date   CREATININE 0.84 01/30/2023   BUN 16 01/30/2023   NA 141 01/30/2023   K 3.9 01/30/2023   CL 107 01/30/2023   CO2 28 01/30/2023   Lab Results  Component Value Date   ALT 23 01/30/2023   AST 21 01/30/2023   ALKPHOS 55 01/30/2023   BILITOT 0.6 01/30/2023   Lab Results  Component Value Date   HGBA1C 5.9 (H) 12/24/2022   Lab Results  Component Value Date   INSULIN 12.2 12/24/2022   Lab Results  Component Value Date   TSH 2.930 12/24/2022   Lab Results  Component Value Date   CHOL 214 (  H) 12/24/2022   HDL 52 12/24/2022   LDLCALC 127 (H) 12/24/2022   TRIG 199 (H) 12/24/2022   CHOLHDL 3.5 10/27/2020   Lab Results  Component Value Date   VD25OH 58.3 12/24/2022   Lab Results  Component Value Date   WBC 4.7 01/30/2023   HGB 14.3 01/30/2023   HCT 42.5 01/30/2023   MCV 86.4 01/30/2023   PLT 282 01/30/2023   Lab Results  Component Value Date   FERRITIN 99 04/01/2022   Attestation Statements:   Reviewed by clinician on day of visit: allergies, medications,  problem list, medical history, surgical history, family history, social history, and previous encounter notes.  I, Malcolm Metro, RMA, am acting as transcriptionist for Reuben Likes, MD.  I have reviewed the above documentation for accuracy and completeness, and I agree with the above. - Reuben Likes, MD

## 2023-02-24 ENCOUNTER — Telehealth: Payer: Self-pay | Admitting: *Deleted

## 2023-02-24 NOTE — Telephone Encounter (Signed)
Attempted to reach pt to reschedule pre-visit . VM was full so no message could be left.

## 2023-02-25 ENCOUNTER — Ambulatory Visit (INDEPENDENT_AMBULATORY_CARE_PROVIDER_SITE_OTHER): Payer: BC Managed Care – PPO | Admitting: Family Medicine

## 2023-02-26 ENCOUNTER — Encounter (INDEPENDENT_AMBULATORY_CARE_PROVIDER_SITE_OTHER): Payer: Self-pay | Admitting: Family Medicine

## 2023-02-26 ENCOUNTER — Ambulatory Visit (INDEPENDENT_AMBULATORY_CARE_PROVIDER_SITE_OTHER): Payer: BC Managed Care – PPO | Admitting: Family Medicine

## 2023-02-26 VITALS — BP 121/65 | HR 80 | Temp 98.0°F | Ht 66.0 in | Wt 195.0 lb

## 2023-02-26 DIAGNOSIS — Z6831 Body mass index (BMI) 31.0-31.9, adult: Secondary | ICD-10-CM | POA: Diagnosis not present

## 2023-02-26 DIAGNOSIS — R7303 Prediabetes: Secondary | ICD-10-CM | POA: Diagnosis not present

## 2023-02-26 DIAGNOSIS — E7849 Other hyperlipidemia: Secondary | ICD-10-CM

## 2023-02-26 DIAGNOSIS — E669 Obesity, unspecified: Secondary | ICD-10-CM | POA: Diagnosis not present

## 2023-02-26 NOTE — Progress Notes (Signed)
Chief Complaint:   OBESITY [name] is here to discuss her progress with her obesity treatment plan along with follow-up of her obesity related diagnoses. [name] is on the Category 3 Plan and states she is following her eating plan approximately 98% of the time. [name] states she is exercising and walking for 60 minutes 3 times per week.  Today's visit was #: 4 Starting weight: 207 lbs Starting date: 12/24/2022 Today's weight: 195 lbs Today's date: 02/26/2023 Total lbs lost to date: 12 Total lbs lost since last in-office visit: 3  Interim History: Patient went to [place] prior to Mother's Day and came back on Mother's Day.  She celebrated her aunt's 99th birthday and did have to eat off plan a bit.  She recognizes when she has to be mindful of choices she is making when she has less control of food choices.  She is weighing food more frequently.  She has been logging food on L[employer] It.  She is still low on calories and low on protein.  She is often under eating.   Subjective:   1. Prediabetes Patient is on metformin daily, and she denies GI side effects.  2. Other hyperlipidemia Patient is on Crestor daily.  Her last LDL was slightly elevated.  Assessment/Plan:   1. Prediabetes Patient is to stop metformin daily, and we will repeat labs in July.  2. Other hyperlipidemia We will repeat labs in 2 months.  3. BMI 31.0-31.9,adult  4. Obesity with starting BMI of 33.4 [name] is currently in the action stage of change. As such, her goal is to continue with weight loss efforts. She has agreed to the Category 3 Plan.   Exercise goals: As is.   Behavioral modification strategies: increasing lean protein intake, meal planning and cooking strategies, keeping healthy foods in the home, and planning for success.  [name] has agreed to follow-up with our clinic in 2 weeks. She was informed of the importance of frequent follow-up visits to maximize her success with intensive lifestyle  modifications for her multiple health conditions.   Objective:   Blood pressure 121/65, pulse 80, temperature 98 F (36.7 C), height 5\' 6"  (1.676 m), weight 195 lb (88.5 kg), SpO2 97 %. Body mass index is 31.47 kg/m.  General: Cooperative, alert, well developed, in no acute distress. HEENT: Conjunctivae and lids unremarkable. Cardiovascular: Regular rhythm.  Lungs: Normal work of breathing. Neurologic: No focal deficits.   Lab Results  Component Value Date   CREATININE 0.84 01/30/2023   BUN 16 01/30/2023   NA 141 01/30/2023   K 3.9 01/30/2023   CL 107 01/30/2023   CO2 28 01/30/2023   Lab Results  Component Value Date   ALT 23 01/30/2023   AST 21 01/30/2023   ALKPHOS 55 01/30/2023   BILITOT 0.6 01/30/2023   Lab Results  Component Value Date   HGBA1C 5.9 (H) 12/24/2022   Lab Results  Component Value Date   INSULIN 12.2 12/24/2022   Lab Results  Component Value Date   TSH 2.930 12/24/2022   Lab Results  Component Value Date   CHOL 214 (H) 12/24/2022   HDL 52 12/24/2022   LDLCALC 127 (H) 12/24/2022   TRIG 199 (H) 12/24/2022   CHOLHDL 3.5 10/27/2020   Lab Results  Component Value Date   VD25OH 58.3 12/24/2022   Lab Results  Component Value Date   WBC 4.7 01/30/2023   HGB 14.3 01/30/2023   HCT 42.5 01/30/2023   MCV 86.4 01/30/2023  PLT 282 01/30/2023   Lab Results  Component Value Date   FERRITIN 99 04/01/2022   Attestation Statements:   Reviewed by clinician on day of visit: allergies, medications, problem list, medical history, surgical history, family history, social history, and previous encounter notes.   I, Burt Knack, am acting as transcriptionist for Reuben Likes, MD.  I have reviewed the above documentation for accuracy and completeness, and I agree with the above. - Reuben Likes, MD

## 2023-03-12 ENCOUNTER — Encounter (INDEPENDENT_AMBULATORY_CARE_PROVIDER_SITE_OTHER): Payer: Self-pay | Admitting: Family Medicine

## 2023-03-12 ENCOUNTER — Ambulatory Visit (INDEPENDENT_AMBULATORY_CARE_PROVIDER_SITE_OTHER): Payer: BC Managed Care – PPO | Admitting: Family Medicine

## 2023-03-12 VITALS — BP 138/74 | HR 71 | Temp 97.8°F | Ht 66.0 in | Wt 196.0 lb

## 2023-03-12 DIAGNOSIS — E7849 Other hyperlipidemia: Secondary | ICD-10-CM

## 2023-03-12 DIAGNOSIS — F5089 Other specified eating disorder: Secondary | ICD-10-CM

## 2023-03-12 DIAGNOSIS — E669 Obesity, unspecified: Secondary | ICD-10-CM | POA: Diagnosis not present

## 2023-03-12 DIAGNOSIS — Z6831 Body mass index (BMI) 31.0-31.9, adult: Secondary | ICD-10-CM | POA: Diagnosis not present

## 2023-03-12 MED ORDER — LOMAIRA 8 MG PO TABS
ORAL_TABLET | ORAL | 0 refills | Status: DC
Start: 2023-03-12 — End: 2023-03-13

## 2023-03-12 MED ORDER — TOPIRAMATE 25 MG PO TABS
ORAL_TABLET | ORAL | 0 refills | Status: DC
Start: 2023-03-12 — End: 2023-05-31

## 2023-03-12 NOTE — Telephone Encounter (Signed)
Please advise 

## 2023-03-12 NOTE — Progress Notes (Unsigned)
Chief Complaint:   OBESITY [name] is here to discuss her progress with her obesity treatment plan along with follow-up of her obesity related diagnoses. [name] is on the Category 3 Plan and states she is following her eating plan approximately 98% of the time. [name] states she is walking and at the gym for 60 minutes 1-3 times per week.  Today's visit was #: 5 Starting weight: 207 lbs Starting date: 12/24/2022 Today's weight: 196 lbs Today's date: 03/12/2023 Total lbs lost to date: 11 Total lbs lost since last in-office visit: 0  Interim History: Patient voices she is not eating anything different than she did previously.  She does 2 eggs with 1 piece of toast sometime she does 1 egg with 1 slice cheese and 2 toast.  Lunch is [place] with low carb wrap, lettuce, cheese. May do sugar free pudding.  Supper is grilled chicken breast or chicken thighs with chick pea pasta and vegetables with spray butter.  She is eating around 1.5 pork chops with cauliflower rice.  She recognizes she is eating enough but her cravings are very intense.   Subjective:   1. Other disorder of eating Patient would like to get some assistance with craving control.  PDMP was checked with no concerns.  Controlled substance contract was signed by the patient.  2. Other hyperlipidemia Patient is on Crestor, and she denies GI side effects.   Assessment/Plan:   1. Other disorder of eating Patient agreed to start Lomaira at 4 mg and start Topamax at 25 mg.   - topiramate (TOPAMAX) 25 MG tablet; Take 1 tablet (25 mg total) by mouth daily for 14 days, THEN 2 tablets (50 mg total) daily for 14 days.  Dispense: 46 tablet; Refill: 0 - Phentermine HCl (LOMAIRA) 8 MG TABS; Take 0.5 tablets (4 mg total) by mouth daily for 14 days, THEN 1 tablet (8 mg total) daily for 14 days.  Dispense: 23 tablet; Refill: 0  2. Other hyperlipidemia Patient will continue Crestor. We will follow-up at her next appointment.   3. BMI  31.0-31.9,adult  4. Obesity with starting BMI of 33.4 [name] is currently in the action stage of change. As such, her goal is to continue with weight loss efforts. She has agreed to the Category 3 Plan.   Exercise goals: As is.   Behavioral modification strategies: increasing lean protein intake, meal planning and cooking strategies, keeping healthy foods in the home, and planning for success.  [place] has agreed to follow-up with our clinic in 3 weeks. She was informed of the importance of frequent follow-up visits to maximize her success with intensive lifestyle modifications for her multiple health conditions.   Objective:   Blood pressure 138/74, pulse 71, temperature 97.8 F (36.6 C), height 5\' 6"  (1.676 m), weight 196 lb (88.9 kg), SpO2 96 %. Body mass index is 31.64 kg/m.  General: Cooperative, alert, well developed, in no acute distress. HEENT: Conjunctivae and lids unremarkable. Cardiovascular: Regular rhythm.  Lungs: Normal work of breathing. Neurologic: No focal deficits.   Lab Results  Component Value Date   CREATININE 0.84 01/30/2023   BUN 16 01/30/2023   NA 141 01/30/2023   K 3.9 01/30/2023   CL 107 01/30/2023   CO2 28 01/30/2023   Lab Results  Component Value Date   ALT 23 01/30/2023   AST 21 01/30/2023   ALKPHOS 55 01/30/2023   BILITOT 0.6 01/30/2023   Lab Results  Component Value Date   HGBA1C 5.9 (H)  12/24/2022   Lab Results  Component Value Date   INSULIN 12.2 12/24/2022   Lab Results  Component Value Date   TSH 2.930 12/24/2022   Lab Results  Component Value Date   CHOL 214 (H) 12/24/2022   HDL 52 12/24/2022   LDLCALC 127 (H) 12/24/2022   TRIG 199 (H) 12/24/2022   CHOLHDL 3.5 10/27/2020   Lab Results  Component Value Date   VD25OH 58.3 12/24/2022   Lab Results  Component Value Date   WBC 4.7 01/30/2023   HGB 14.3 01/30/2023   HCT 42.5 01/30/2023   MCV 86.4 01/30/2023   PLT 282 01/30/2023   Lab Results  Component Value Date    FERRITIN 99 04/01/2022   Attestation Statements:   Reviewed by clinician on day of visit: allergies, medications, problem list, medical history, surgical history, family history, social history, and previous encounter notes.   I, Burt Knack, am acting as transcriptionist for Reuben Likes, MD.  I have reviewed the above documentation for accuracy and completeness, and I agree with the above. - Reuben Likes, MD

## 2023-03-13 ENCOUNTER — Telehealth (INDEPENDENT_AMBULATORY_CARE_PROVIDER_SITE_OTHER): Payer: Self-pay | Admitting: *Deleted

## 2023-03-13 MED ORDER — LOMAIRA 8 MG PO TABS
ORAL_TABLET | ORAL | 0 refills | Status: DC
Start: 2023-03-13 — End: 2023-05-08

## 2023-03-13 NOTE — Telephone Encounter (Signed)
Prior authorization done for patients [name]. Waiting on determination.

## 2023-03-18 ENCOUNTER — Encounter (INDEPENDENT_AMBULATORY_CARE_PROVIDER_SITE_OTHER): Payer: Self-pay

## 2023-03-21 ENCOUNTER — Encounter: Payer: Self-pay | Admitting: Nurse Practitioner

## 2023-03-26 ENCOUNTER — Ambulatory Visit (INDEPENDENT_AMBULATORY_CARE_PROVIDER_SITE_OTHER): Payer: BC Managed Care – PPO | Admitting: Family Medicine

## 2023-03-27 ENCOUNTER — Encounter: Payer: Self-pay | Admitting: Nurse Practitioner

## 2023-03-27 ENCOUNTER — Telehealth: Payer: Self-pay

## 2023-03-27 NOTE — Telephone Encounter (Signed)
Called mobile phone and home phone but unable to reach patient. Left message letting patient know that she missed the 9am appointment and she would need to call back and reschedule a Pre visit appointment with nurse by 5pm today or the colonoscopy would be cancelled.

## 2023-03-27 NOTE — Telephone Encounter (Signed)
Nurses Patient truly needs office visit (Treating site unseen is not thorough care) Please connect with patient I am willing to work her in at the end of this afternoon 4:15 PM if she would like to take that option

## 2023-03-27 NOTE — Telephone Encounter (Signed)
Patient was rescheduled for PV on 7/11 at 8:30 and colonoscopy was rescheduled for 8/7 at 11:30

## 2023-03-28 ENCOUNTER — Other Ambulatory Visit: Payer: Self-pay | Admitting: Nurse Practitioner

## 2023-03-28 ENCOUNTER — Other Ambulatory Visit: Payer: Self-pay

## 2023-03-28 MED ORDER — PROMETHAZINE-DM 6.25-15 MG/5ML PO SYRP: 5.0000 mL | ORAL_SOLUTION | Freq: Four times a day (QID) | ORAL | 0 refills | Status: DC | PRN

## 2023-03-28 MED ORDER — AZITHROMYCIN 250 MG PO TABS: ORAL_TABLET | ORAL | 0 refills | Status: DC

## 2023-03-31 ENCOUNTER — Encounter: Payer: Self-pay | Admitting: Nurse Practitioner

## 2023-03-31 ENCOUNTER — Ambulatory Visit: Payer: BC Managed Care – PPO | Admitting: Nurse Practitioner

## 2023-03-31 VITALS — BP 121/85 | HR 78 | Temp 98.2°F | Wt 197.6 lb

## 2023-03-31 DIAGNOSIS — J189 Pneumonia, unspecified organism: Secondary | ICD-10-CM | POA: Diagnosis not present

## 2023-03-31 DIAGNOSIS — B9689 Other specified bacterial agents as the cause of diseases classified elsewhere: Secondary | ICD-10-CM

## 2023-03-31 DIAGNOSIS — J069 Acute upper respiratory infection, unspecified: Secondary | ICD-10-CM

## 2023-03-31 MED ORDER — BUDESONIDE-FORMOTEROL FUMARATE 80-4.5 MCG/ACT IN AERO: 2.0000 | INHALATION_SPRAY | Freq: Two times a day (BID) | RESPIRATORY_TRACT | 0 refills | Status: DC

## 2023-03-31 MED ORDER — AMOXICILLIN-POT CLAVULANATE 875-125 MG PO TABS: 1.0000 | ORAL_TABLET | Freq: Two times a day (BID) | ORAL | 0 refills | Status: DC

## 2023-03-31 NOTE — Progress Notes (Signed)
   Subjective:    Patient ID: [name], female    DOB: 20-Sep-1961, 62 y.o.   MRN: [id number]  HPI Patient arrives today with Cough worsen and productive yellow. Patient states she is still having fatigue.  See previous MyChart messages.  Patient has completed 4 days of her Z-Pak.  Cough is slightly better, much worse at night.  Producing slight yellow mucus at times.  Slight wheezing.  No chest pain.  No significant shortness of breath.  No sore throat or ear pain.  Has tried her Promethazine DM cough syrup at night which helped her rest.  Review of Systems  Constitutional:  Positive for fatigue. Negative for fever.  HENT:  Negative for ear pain, sinus pressure, sinus pain and sore throat.   Respiratory:  Positive for cough and wheezing. Negative for chest tightness and shortness of breath.   Cardiovascular:  Negative for chest pain.       Objective:   Physical Exam NAD.  Alert, oriented.  TMs retracted bilaterally, no erythema.  Nares clear.  Pharynx clear and moist.  Yellowish PND noted.  Neck supple with mild soft anterior adenopathy.  Lungs faint expiratory crackles noted only in the left upper lobe.  Faint wheezing noted anterior only.  Otherwise lungs clear.  No tachypnea.  Normal color.  Heart regular rate rhythm. Today's Vitals   03/31/23 1128  BP: 121/85  Pulse: 78  Temp: 98.2 F (36.8 C)  SpO2: 97%  Weight: 197 lb 9.6 oz (89.6 kg)   Body mass index is 31.89 kg/m.        Assessment & Plan:  Bacterial URI  Pneumonia of left upper lobe due to infectious organism  Based on examination feel that patient has slight pneumonia in the left upper lobe.  Since she is stable on exam with no risk factors and her oxygen levels are normal, will go ahead and treat. Meds ordered this encounter  Medications   amoxicillin-clavulanate (AUGMENTIN) 875-125 MG tablet    Sig: Take 1 tablet by mouth 2 (two) times daily.    Dispense:  14 tablet    Refill:  0    Order Specific  Question:   Supervising Provider    Answer:   Lilyan Punt A [[id number]]   budesonide-formoterol (SYMBICORT) 80-4.5 MCG/ACT inhaler    Sig: Inhale 2 puffs into the lungs 2 (two) times daily.    Dispense:  1 each    Refill:  0    Order Specific Question:   Supervising Provider    Answer:   Lilyan Punt A [[id number]]   Start Augmentin as directed.  Mucinex dm as directed. Defers oral steroids at this time.  Start Symbicort as directed for cough and wheezing. Recheck in 48 hours.  Warning signs reviewed.  Call back or go to ED sooner if worse.

## 2023-04-01 ENCOUNTER — Ambulatory Visit (INDEPENDENT_AMBULATORY_CARE_PROVIDER_SITE_OTHER): Payer: BC Managed Care – PPO | Admitting: Family Medicine

## 2023-04-02 ENCOUNTER — Ambulatory Visit: Payer: BC Managed Care – PPO | Admitting: Nurse Practitioner

## 2023-04-02 ENCOUNTER — Encounter: Payer: Self-pay | Admitting: Nurse Practitioner

## 2023-04-02 VITALS — BP 122/82 | HR 74 | Temp 98.4°F | Wt 196.8 lb

## 2023-04-02 DIAGNOSIS — J069 Acute upper respiratory infection, unspecified: Secondary | ICD-10-CM | POA: Diagnosis not present

## 2023-04-02 DIAGNOSIS — B9689 Other specified bacterial agents as the cause of diseases classified elsewhere: Secondary | ICD-10-CM | POA: Diagnosis not present

## 2023-04-02 NOTE — Progress Notes (Signed)
   Subjective:    Patient ID: [name], female    DOB: Oct 05, 1961, 62 y.o.   MRN: [id number]  Cough The problem has been unchanged. The cough is Productive of sputum. Associated symptoms include nasal congestion. Pertinent negatives include no chest pain, ear pain, fever, shortness of breath or wheezing. The symptoms are aggravated by exercise.  Presents for recheck from visit 2 days ago see previous note.  Minimal change but cough has improved some.  Was able to go to the gym this morning with minimal cough.  Has started her Symbicort inhaler. Denies any acid reflux or heartburn symptoms.   Review of Systems  Constitutional:  Negative for fever.  HENT:  Negative for ear pain and sinus pressure.   Respiratory:  Positive for cough. Negative for chest tightness, shortness of breath and wheezing.   Cardiovascular:  Negative for chest pain.  Genitourinary:  Negative for menstrual problem.       Objective:   Physical Exam NAD.  Alert, oriented.  TMs retracted bilaterally, no erythema.  Pharynx clear and moist.  Neck supple with mild soft anterior adenopathy.  Lungs clear.  No further congestion noted in the left upper lobe.  Frequent nonproductive cough.  Heart regular rate rhythm. Today's Vitals   04/02/23 1110  BP: 122/82  Pulse: 74  Temp: 98.4 F (36.9 C)  TempSrc: Temporal  SpO2: 98%  Weight: 196 lb 12.8 oz (89.3 kg)   Body mass index is 31.76 kg/m.        Assessment & Plan:  Bacterial URI Complete Augmentin as directed.  Continue Symbicort and Promethazine DM as directed.  Expect continued improvement in symptoms, call back next week if no improvement, sooner if worse.

## 2023-04-03 DIAGNOSIS — J309 Allergic rhinitis, unspecified: Secondary | ICD-10-CM | POA: Insufficient documentation

## 2023-04-03 DIAGNOSIS — B349 Viral infection, unspecified: Secondary | ICD-10-CM | POA: Insufficient documentation

## 2023-04-03 DIAGNOSIS — F32A Depression, unspecified: Secondary | ICD-10-CM | POA: Insufficient documentation

## 2023-04-03 DIAGNOSIS — Z8489 Family history of other specified conditions: Secondary | ICD-10-CM | POA: Insufficient documentation

## 2023-04-03 DIAGNOSIS — J209 Acute bronchitis, unspecified: Secondary | ICD-10-CM | POA: Insufficient documentation

## 2023-04-08 ENCOUNTER — Encounter: Payer: BC Managed Care – PPO | Admitting: Gastroenterology

## 2023-04-14 ENCOUNTER — Encounter: Payer: Self-pay | Admitting: Nurse Practitioner

## 2023-04-14 ENCOUNTER — Encounter (INDEPENDENT_AMBULATORY_CARE_PROVIDER_SITE_OTHER): Payer: Self-pay | Admitting: Family Medicine

## 2023-04-17 ENCOUNTER — Encounter: Payer: Self-pay | Admitting: Gastroenterology

## 2023-04-17 ENCOUNTER — Ambulatory Visit (AMBULATORY_SURGERY_CENTER): Payer: BC Managed Care – PPO

## 2023-04-17 VITALS — Ht 66.0 in | Wt 195.0 lb

## 2023-04-17 DIAGNOSIS — C562 Malignant neoplasm of left ovary: Secondary | ICD-10-CM

## 2023-04-17 DIAGNOSIS — Z86010 Personal history of colonic polyps: Secondary | ICD-10-CM

## 2023-04-17 MED ORDER — NA SULFATE-K SULFATE-MG SULF 17.5-3.13-1.6 GM/177ML PO SOLN
1.0000 | Freq: Once | ORAL | 0 refills | Status: AC
Start: 2023-04-17 — End: 2023-04-17

## 2023-04-17 NOTE — Telephone Encounter (Signed)
Please advise 

## 2023-04-17 NOTE — Progress Notes (Signed)
No egg or soy allergy known to patient  No issues known to pt with past sedation with any surgeries or procedures Patient denies ever being told they had issues or difficulty with intubation  No FH of Malignant Hyperthermia Pt is not on diet pills Pt is not on  home 02  Pt is not on blood thinners  Pt denies issues with constipation  No A fib or A flutter Have any cardiac testing pending--no  LOA: independent Prep: suprep   Patient's chart reviewed by John Nulty CNRA prior to previsit and patient appropriate for the [facility].  Previsit completed and red dot placed by patient's name on their procedure day (on provider's schedule).     PV competed with patient. Prep instructions sent via mychart and home address. Goodrx coupon for [facility] provided to use for price reduction if needed.  

## 2023-04-23 ENCOUNTER — Ambulatory Visit (INDEPENDENT_AMBULATORY_CARE_PROVIDER_SITE_OTHER): Payer: BC Managed Care – PPO | Admitting: Family Medicine

## 2023-04-28 ENCOUNTER — Other Ambulatory Visit: Payer: Self-pay

## 2023-04-28 ENCOUNTER — Inpatient Hospital Stay: Payer: BC Managed Care – PPO | Attending: Gynecologic Oncology

## 2023-04-28 ENCOUNTER — Ambulatory Visit (HOSPITAL_COMMUNITY)
Admission: RE | Admit: 2023-04-28 | Discharge: 2023-04-28 | Disposition: A | Discharge status: Home / Self Care | Payer: BC Managed Care – PPO | Source: Ambulatory Visit | Attending: Hematology | Admitting: Hematology

## 2023-04-28 ENCOUNTER — Encounter: Payer: Self-pay | Admitting: Family Medicine

## 2023-04-28 DIAGNOSIS — Z79899 Other long term (current) drug therapy: Secondary | ICD-10-CM | POA: Insufficient documentation

## 2023-04-28 DIAGNOSIS — C562 Malignant neoplasm of left ovary: Secondary | ICD-10-CM | POA: Diagnosis present

## 2023-04-28 DIAGNOSIS — Z8673 Personal history of transient ischemic attack (TIA), and cerebral infarction without residual deficits: Secondary | ICD-10-CM | POA: Insufficient documentation

## 2023-04-28 DIAGNOSIS — K8020 Calculus of gallbladder without cholecystitis without obstruction: Secondary | ICD-10-CM | POA: Insufficient documentation

## 2023-04-28 DIAGNOSIS — Z9221 Personal history of antineoplastic chemotherapy: Secondary | ICD-10-CM | POA: Insufficient documentation

## 2023-04-28 DIAGNOSIS — K5730 Diverticulosis of large intestine without perforation or abscess without bleeding: Secondary | ICD-10-CM | POA: Insufficient documentation

## 2023-04-28 DIAGNOSIS — K635 Polyp of colon: Secondary | ICD-10-CM | POA: Insufficient documentation

## 2023-04-28 DIAGNOSIS — E785 Hyperlipidemia, unspecified: Secondary | ICD-10-CM | POA: Insufficient documentation

## 2023-04-28 DIAGNOSIS — K760 Fatty (change of) liver, not elsewhere classified: Secondary | ICD-10-CM | POA: Insufficient documentation

## 2023-04-28 DIAGNOSIS — N200 Calculus of kidney: Secondary | ICD-10-CM | POA: Insufficient documentation

## 2023-04-28 DIAGNOSIS — Z7951 Long term (current) use of inhaled steroids: Secondary | ICD-10-CM | POA: Insufficient documentation

## 2023-04-28 DIAGNOSIS — Z8542 Personal history of malignant neoplasm of other parts of uterus: Secondary | ICD-10-CM | POA: Insufficient documentation

## 2023-04-28 DIAGNOSIS — K219 Gastro-esophageal reflux disease without esophagitis: Secondary | ICD-10-CM | POA: Insufficient documentation

## 2023-04-28 LAB — CMP (CANCER CENTER ONLY)
ALT: 42 U/L (ref 0–44)
AST: 28 U/L (ref 15–41)
Albumin: 4.2 g/dL (ref 3.5–5.0)
Alkaline Phosphatase: 74 U/L (ref 38–126)
Anion gap: 6 (ref 5–15)
BUN: 17 mg/dL (ref 6–20)
CO2: 28 mmol/L (ref 22–32)
Calcium: 9.6 mg/dL (ref 8.9–10.3)
Chloride: 108 mmol/L (ref 98–111)
Creatinine: 0.88 mg/dL (ref 0.44–1.00)
GFR, Estimated: >60 mL/min (ref >60)
Glucose, Bld: 90 mg/dL (ref 70–99)
Potassium: 4.4 mmol/L (ref 3.5–5.1)
Sodium: 142 mmol/L (ref 135–145)
Total Bilirubin: 0.5 mg/dL (ref 0.3–1.2)
Total Protein: 6.8 g/dL (ref 6.5–8.1)

## 2023-04-28 LAB — CBC WITH DIFFERENTIAL (CANCER CENTER ONLY)
Abs Immature Granulocytes: 0.01 10*3/uL (ref 0.00–0.07)
Basophils Absolute: 0 10*3/uL (ref 0.0–0.1)
Basophils Relative: 0 %
Eosinophils Absolute: 0.4 10*3/uL (ref 0.0–0.5)
Eosinophils Relative: 7 %
HCT: 43.1 % (ref 36.0–46.0)
Hemoglobin: 14.5 g/dL (ref 12.0–15.0)
Immature Granulocytes: 0 %
Lymphocytes Relative: 38 %
Lymphs Abs: 2.1 10*3/uL (ref 0.7–4.0)
MCH: 29.4 pg (ref 26.0–34.0)
MCHC: 33.6 g/dL (ref 30.0–36.0)
MCV: 87.4 fL (ref 80.0–100.0)
Monocytes Absolute: 0.4 10*3/uL (ref 0.1–1.0)
Monocytes Relative: 8 %
Neutro Abs: 2.5 10*3/uL (ref 1.7–7.7)
Neutrophils Relative %: 47 %
Platelet Count: 253 10*3/uL (ref 150–400)
RBC: 4.93 MIL/uL (ref 3.87–5.11)
RDW: 13.1 % (ref 11.5–15.5)
WBC Count: 5.4 10*3/uL (ref 4.0–10.5)
nRBC: 0 % (ref 0.0–0.2)

## 2023-04-28 MED ORDER — SODIUM CHLORIDE (PF) 0.9 % IJ SOLN
INTRAMUSCULAR | Status: AC
  Filled 2023-04-28: qty 50

## 2023-04-28 MED ORDER — IOHEXOL 9 MG/ML PO SOLN
1000.0000 mL | ORAL | Status: AC
  Administered 2023-04-28: 1000 mL via ORAL

## 2023-04-28 MED ORDER — IOHEXOL 9 MG/ML PO SOLN
ORAL | Status: AC
  Filled 2023-04-28: qty 1000

## 2023-04-28 MED ORDER — IOHEXOL 300 MG/ML  SOLN
100.0000 mL | Freq: Once | INTRAMUSCULAR | Status: AC | PRN
  Administered 2023-04-28: 100 mL via INTRAVENOUS

## 2023-04-28 NOTE — Telephone Encounter (Signed)
Nurses I reviewed over the CAT scan it shows gallstones but no signs of cholecystitis.  Did not show any metastatic disease  As for the blisters on top of her mouth uncertain why that is happening but she can use a compounded gel Triamcinolone mixed in with Orabase B  This compound is made at [employer] but may or may not be covered by insurance.  If she is interested in it we can call some in that she can apply thin amount to the area 2-3 times a day to help it heal out if the blisters become extensive or if they persist it would be important for her to be seen thank you  If she would like this compound called and please let me know and I can call him into [place] care

## 2023-04-30 NOTE — Assessment & Plan Note (Signed)
Stage IIB -presented with acute left abdominal pain. CT AP on 12/13/21 in ED showed a 6.6 cm hemorrhagic mass. Baseline CA 125 from 01/03/22 was WNL at 9.7. her pain resolved and no recurrent or other symptoms -S/p left salpingo-oophorectomy on 03/12/22 with Dr. Pricilla Holm. 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 I spoke with pathologist Dr. 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  -colonoscopy on 03/28/22 with Dr. Barron Alvine showed no suspicious mass, only polyps.  -baseline CEA on 04/01/22 was WNL.  -PET scan on 04/10/22 showed: no convincing evidence of hypermetabolic disease; postsurgical hypermetabolism in left adnexa and abdominal wall. -enterography on 04/15/22 showed: no small bowel masses or nodularity or tumor along omentum or mesentery; no definite appendiceal tumor. -she completed 4 cycles adjuvant CAPEOX 04/30/22 - 07/02/22 and one additional cycle of Xeloda alone.   -Surveillance CT scan from April 28, 2023 showed no evidence of recurrence, I personally reviewed the images with pt. -She is clinically doing well, lab reviewed, no concern for recurrence.  Will continue cancer surveillance.

## 2023-05-01 ENCOUNTER — Other Ambulatory Visit: Payer: Self-pay

## 2023-05-01 ENCOUNTER — Inpatient Hospital Stay: Payer: BC Managed Care – PPO | Admitting: Hematology

## 2023-05-01 ENCOUNTER — Encounter: Payer: Self-pay | Admitting: Hematology

## 2023-05-01 VITALS — BP 141/63 | HR 77 | Temp 98.9°F | Resp 18 | Ht 66.0 in | Wt 196.9 lb

## 2023-05-01 DIAGNOSIS — Z8673 Personal history of transient ischemic attack (TIA), and cerebral infarction without residual deficits: Secondary | ICD-10-CM | POA: Diagnosis not present

## 2023-05-01 DIAGNOSIS — K635 Polyp of colon: Secondary | ICD-10-CM | POA: Diagnosis not present

## 2023-05-01 DIAGNOSIS — K219 Gastro-esophageal reflux disease without esophagitis: Secondary | ICD-10-CM | POA: Diagnosis not present

## 2023-05-01 DIAGNOSIS — N200 Calculus of kidney: Secondary | ICD-10-CM | POA: Diagnosis not present

## 2023-05-01 DIAGNOSIS — K760 Fatty (change of) liver, not elsewhere classified: Secondary | ICD-10-CM | POA: Diagnosis not present

## 2023-05-01 DIAGNOSIS — C562 Malignant neoplasm of left ovary: Secondary | ICD-10-CM | POA: Diagnosis not present

## 2023-05-01 DIAGNOSIS — Z79899 Other long term (current) drug therapy: Secondary | ICD-10-CM | POA: Diagnosis not present

## 2023-05-01 DIAGNOSIS — K8020 Calculus of gallbladder without cholecystitis without obstruction: Secondary | ICD-10-CM | POA: Diagnosis not present

## 2023-05-01 DIAGNOSIS — E785 Hyperlipidemia, unspecified: Secondary | ICD-10-CM | POA: Diagnosis not present

## 2023-05-01 DIAGNOSIS — K5730 Diverticulosis of large intestine without perforation or abscess without bleeding: Secondary | ICD-10-CM | POA: Diagnosis not present

## 2023-05-01 DIAGNOSIS — Z8542 Personal history of malignant neoplasm of other parts of uterus: Secondary | ICD-10-CM | POA: Diagnosis not present

## 2023-05-01 DIAGNOSIS — Z7951 Long term (current) use of inhaled steroids: Secondary | ICD-10-CM | POA: Diagnosis not present

## 2023-05-01 DIAGNOSIS — Z9221 Personal history of antineoplastic chemotherapy: Secondary | ICD-10-CM | POA: Diagnosis not present

## 2023-05-01 MED ORDER — CITALOPRAM HYDROBROMIDE 40 MG PO TABS: 40.0000 mg | ORAL_TABLET | Freq: Every day | ORAL | 0 refills | Status: DC

## 2023-05-01 MED ORDER — ROSUVASTATIN CALCIUM 5 MG PO TABS: ORAL_TABLET | ORAL | 0 refills | Status: DC

## 2023-05-01 NOTE — Progress Notes (Signed)
[facility]   [phone] [phone]   Clinic Follow up Note   Patient Care Team: Babs Sciara, MD as PCP - General (Family Medicine) Ok Edwards, MD (Inactive) as Consulting Physician (Obstetrics and Gynecology) Shirlean Kelly, MD as Consulting Physician (Neurosurgery) Malissa Hippo, MD (Inactive) as Consulting Physician (Gastroenterology) Malachy Mood, MD as Consulting Physician (Oncology)  Date of Service:  05/01/2023  CHIEF COMPLAINT: f/u of ovarian adenocarcinoma      CURRENT THERAPY:  Surveillance   ASSESSMENT:  [name] is a 62 y.o. female with   Adenocarcinoma (epithelial) of ovary, left (HCC) Stage IIB -presented with acute left abdominal pain. CT [facility] on 12/13/21 in ED showed a 6.6 cm hemorrhagic mass. Baseline CA 125 from 01/03/22 was WNL at 9.7. her pain resolved and no recurrent or other symptoms -S/p left salpingo-oophorectomy on 03/12/22 with Dr. Pricilla Holm. 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 I spoke with pathologist Dr. 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  -colonoscopy on 03/28/22 with Dr. Barron Alvine showed no suspicious mass, only polyps.  -baseline CEA on 04/01/22 was WNL.  -PET scan on 04/10/22 showed: no convincing evidence of hypermetabolic disease; postsurgical hypermetabolism in left adnexa and abdominal wall. -enterography on 04/15/22 showed: no small bowel masses or nodularity or tumor along omentum or mesentery; no definite appendiceal tumor. -she completed 4 cycles adjuvant CAPEOX 04/30/22 - 07/02/22 and one additional cycle of Xeloda alone.   -Surveillance CT scan from April 28, 2023 showed no evidence of recurrence, I personally reviewed the images with pt. -She is clinically doing well, lab reviewed, no concern for recurrence.  Will  continue cancer surveillance.    PLAN: -reviewed CT scan w/ pt- no evidence of recurrence -continue cancer surveillance -I refill citalopram -I refill Crestor -Plan to repeat scan in December 2024 -lab and f/u in 3 months   SUMMARY OF ONCOLOGIC HISTORY: Oncology History Overview Note   Cancer Staging  Adenocarcinoma (epithelial) of ovary, left Staging form: Ovary, Fallopian Tube, and Primary Peritoneal Carcinoma, AJCC 8th Edition - Clinical stage from 04/17/2022: FIGO Stage IIB (cT2b, cN0, cM0) - Signed by Malachy Mood, MD on 04/22/2022 Stage prefix: Initial diagnosis Histologic grade (G): GX Histologic grading system: 4 grade system     Adenocarcinoma (epithelial) of ovary, left (HCC)  12/13/2021 Imaging   EXAM: CT ABDOMEN AND PELVIS WITH CONTRAST  IMPRESSION: 1. 6.6 cm hyperdense left adnexal/ovarian mass, likely a hemorrhagic cyst/mass. Recommend follow-up pelvic ultrasound. 2. 1.5 cm hyperenhancing lesion in the right hepatic lobe. Most likely differentials include hemangioma, FNH, adenoma. Consider nonemergent follow-up MRI abdomen with contrast.   01/03/2022 Tumor Marker   CA 125: 9.7 (WNL)   01/14/2022 Imaging   EXAM: MRI ABDOMEN WITHOUT AND WITH CONTRAST  IMPRESSION: Enhancing 13 mm segment VI hepatic lesion measuring 13 mm demonstrates nonspecific imaging characteristics but which is favored to reflect a benign etiology such as focal nodular hyperplasia or a hepatic adenoma. Recommend follow-up MRI in 6 months with and without EOVIST contrast for more definitive characterization and to assess stability.   03/12/2022 Pathology Results   FINAL MICROSCOPIC DIAGNOSIS:   A.   FALLOPIAN TUBE AND OVARY, LEFT, SALPINGO OOPHORECTOMY:  -    Adenocarcinoma, most consistent with metastatic colorectal  adenocarcinoma, see Comment.  -    Fallopian tube, negative for malignancy/intraepithelial  carcinoma (STIC).   B.   PELVIC SIDEWALL, RIGHT, BIOPSY:  -    Benign,  fibroelastic nodule.  -    Negative for malignancy.   C.   CUL DE SAC, POSTERIOR, BIOPSY:  -    Negative for malignancy.   D.   CUL DE SAC, ANTERIOR, BIOPSY:  -    Negative for malignancy.   E.   PELVIC SIDEWALL, LEFT, BIOPSY:  -    Negative for malignancy.   F.   PARACOLIC GUTTER, LEFT, BIOPSY:  -    Negative for malignancy.   G.   PARACOLIC GUTTER, RIGHT, BIOPSY:  -    Negative for malignancy.   H.   OMENTUM:  -    Negative for malignancy.   I.   OVARY REMNANT, LEFT, EXCISION:  -    Adenocarcinoma, most consistent with metastatic colorectal  adenocarcinoma.   COMMENT:  The tumor was interrogated with immunohistochemical (IHC) stains.  The tumor cells are diffusely and strongly positive for CDX-2, CK20 and mCEA and the tumor cells are CK7 negative.   This immunoprofile strongly favors metastatic colorectal adenocarcinoma.   The tumor cells are PAX8 negative to focally equivocal, a finding which is non-contributory. Primary ovarian mucinous adenocarcinoma can be CK20, CDX-2 and mCEA positive; although the staining pattern is typically focal and not diffuse, as in this case.  Also, supportive of a metastasis is CK7 negativity (primary ovarian mucinous carcinoma is typically CK7 positive).   There are rare cells with intracytoplasmic mucin  (mucicarmine stain).   The best IHC stain to differentiate between metastatic colorectal  adenocarcinoma and primary ovarian mucinous adenocarcinoma is SATB2. This marker has been ordered and will be performed at [facility].  The result will be reported in an addendum.   Furthermore, the tumor cells are negative for serous markers (p16, p53 (wildtype), WT-1) and negative for endometrioid markers (ER and vimentin).  The Ki-67 mitotic index is high.  The IHC stains and mucicarmine stain have satisfactory controls.    ADDENDUM:  The tumor was interrogated with SATB2 immunohistochemical (IHC) stain, performed at [facility].  The tumor has diffuse  strong nuclear reactivity.  This result, in combination with the prior IHC results, is essentially diagnostic of a lower GI (colorectal) or appendiceal primary tumor.  The control is satisfactory.    03/28/2022 Procedure   Colonoscopy, Dr. Barron Alvine  Impression: - Two 2 to 3 mm polyps in the sigmoid colon, removed with a cold snare. Resected and retrieved. - The remainder of the colon was normal, including retoflexed views of the right colon. - The examined portion of the ileum was normal. - Non-bleeding internal hemorrhoids.   04/01/2022 Initial Diagnosis   Adenocarcinoma (epithelial) of ovary, left (HCC)   04/17/2022 Cancer Staging   Staging form: Ovary, Fallopian Tube, and Primary Peritoneal Carcinoma, AJCC 8th Edition - Clinical stage from 04/17/2022: FIGO Stage IIB (cT2b, cN0, cM0) - Signed by Malachy Mood, MD on 04/22/2022 Stage prefix: Initial diagnosis Histologic grade (G): GX Histologic grading system: 4 grade system   04/29/2022 - 07/02/2022 Chemotherapy   Patient is on Treatment Plan : COLORECTAL Xelox ([place]apeox)(130/850) q21d     04/30/2022 - 05/20/2022 Chemotherapy   Patient is on Treatment Plan : COLORECTAL Xelox (Capeox) q21d     07/09/2022 Genetic Testing   Negative hereditary cancer genetic testing: no pathogenic variants detected in [facility] CancerNextExpanded +RNAinsight Panel.  Report date is 07/09/2022.   The CancerNext-Expanded gene panel offered by [facility] and includes sequencing, rearrangement,  and RNA analysis for the following 77 genes: AIP, ALK, APC, ATM, AXIN2, BAP1, BARD1, BLM, BMPR1A, BRCA1, BRCA2, BRIP1, CDC73, CDH1, CDK4, CDKN1B, CDKN2A, CHEK2, CTNNA1, DICER1, FANCC, FH, FLCN, GALNT12, KIF1B, LZTR1, MAX, MEN1, MET, MLH1, MSH2, MSH3, MSH6, MUTYH, NBN, NF1, NF2, NTHL1, PALB2, PHOX2B, PMS2, POT1, PRKAR1A, PTCH1, PTEN, RAD51C, RAD51D, RB1, RECQL, RET, SDHA, SDHAF2, SDHB, SDHC, SDHD, SMAD4, SMARCA4, SMARCB1, SMARCE1, STK11, SUFU, TMEM127, TP53, TSC1, TSC2, VHL and XRCC2  (sequencing and deletion/duplication); EGFR, EGLN1, HOXB13, KIT, MITF, PDGFRA, POLD1, and POLE (sequencing only); EPCAM and GREM1 (deletion/duplication only).   HRD testing is pending.    10/28/2022 Imaging    IMPRESSION: 1. Prior left salpingo-oophorectomy without evidence of local recurrence or metastatic disease within the chest, abdomen, or pelvis. 2. Mild symmetric distal esophageal wall thickening, correlate for symptoms of esophagitis. 3. Diffuse hepatic steatosis. 4. Cholelithiasis without findings of acute cholecystitis. 5. Nonobstructive 2 mm right renal calculus. 6. Sigmoid colonic diverticulosis without findings of acute diverticulitis.   04/28/2023 Imaging    IMPRESSION: 1. Status post hysterectomy without evidence of recurrent or metastatic disease within the chest, abdomen or pelvis. 2. Cholelithiasis without findings of acute cholecystitis.        INTERVAL HISTORY:  [name] is here for a follow up of ovarian adenocarcinoma. She was last seen by me on 01/30/2023. She presents to the clinic accompanied by husband. Pt state that she is doing well. She has been with the weight management and she has learned to eat healthy. Pt has no residual side effects from previous chemo.      All other systems were reviewed with the patient and are negative.  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   BMI 35.0-35.9,adult    Cancer (HCC) 03/12/2022   colon adenoca on ovary   Fatty liver    Gall stone    GERD (gastroesophageal reflux disease)    High blood pressure    History of stroke 2005   at or after childbirth   Hyperlipidemia    Normal spontaneous vaginal delivery    2   Ovarian cancer (HCC)    Pneumonia    Pre-eclampsia     SURGICAL HISTORY: Past Surgical History:  Procedure Laterality Date   BREAST BIOPSY Left  07/15/2017   2 masses   BUNIONECTOMY  08/2021   COLONOSCOPY N/A 06/09/2014   Procedure: COLONOSCOPY;  Surgeon: Malissa Hippo, MD;  Location: [facility];  Service: Endoscopy;  Laterality: N/A;  830-moved to 945 Ann to notify pt   COLONOSCOPY  03/28/2022   HYSTEROTOMY     LAPAROSCOPIC TOTAL HYSTERECTOMY  07/18/2010   RSO   NECK SURGERY  11/2012   c6 c7   ROBOTIC ASSISTED SALPINGO OOPHERECTOMY Left 03/12/2022   Procedure: XI ROBOTIC ASSISTED LEFT SALPINGO OOPHORECTOMY WITH STAGING;  Surgeon: Carver Fila, MD;  Location: [facility];  Service: Gynecology;  Laterality: Left;    I have reviewed the social history and family history with the patient and they are unchanged from previous note.  ALLERGIES:  is allergic to wound dressing adhesive.  MEDICATIONS:  Current Outpatient Medications  Medication Sig Dispense Refill   citalopram (CELEXA) 40 MG tablet Take 1 tablet (40 mg total) by mouth daily. 90 tablet 0   BIOTIN PO Take 4 tablets by mouth daily.     budesonide-formoterol (SYMBICORT)  80-4.5 MCG/ACT inhaler Inhale 2 puffs into the lungs 2 (two) times daily. (Patient not taking: Reported on 04/17/2023) 1 each 0   Cholecalciferol (VITAMIN D3 PO) Take 1 tablet by mouth daily.     citalopram (CELEXA) 20 MG tablet TAKE ONE TABLET (20MG  TOTAL) BY MOUTH DAILY 90 tablet 0   ibuprofen (ADVIL) 800 MG tablet Take 1 tablet (800 mg total) by mouth every 8 (eight) hours as needed for moderate pain. For AFTER surgery only 30 tablet 0   loratadine (CLARITIN) 10 MG tablet Take 10 mg by mouth daily.     MIEBO 1.338 GM/ML SOLN Apply 1 drop to eye 4 (four) times daily.     pantoprazole (PROTONIX) 20 MG tablet Take 1 tablet (20 mg total) by mouth daily. (Patient taking differently: Take 40 mg by mouth daily.) 90 tablet 1   Phentermine HCl (LOMAIRA) 8 MG TABS Take 0.5 tablets (4 mg total) by mouth daily for 14 days, THEN 1 tablet (8 mg total) daily for 14 days. (Patient not taking: Reported on 04/17/2023)  23 tablet 0   promethazine-dextromethorphan (PROMETHAZINE-DM) 6.25-15 MG/5ML syrup Take 5 mLs by mouth 4 (four) times daily as needed for cough. Caution drowsiness (Patient not taking: Reported on 04/17/2023) 118 mL 0   rosuvastatin (CRESTOR) 5 MG tablet Take one tab po qd for cholesterol 90 tablet 0   topiramate (TOPAMAX) 25 MG tablet Take 1 tablet (25 mg total) by mouth daily for 14 days, THEN 2 tablets (50 mg total) daily for 14 days. (Patient not taking: Reported on 04/17/2023) 46 tablet 0   No current facility-administered medications for this visit.    PHYSICAL EXAMINATION: ECOG PERFORMANCE STATUS: 0 - Asymptomatic  Vitals:   05/01/23 1054  BP: (!) 141/63  Pulse: 77  Resp: 18  Temp: 98.9 F (37.2 C)  SpO2: 95%   Wt Readings from Last 3 Encounters:  05/01/23 196 lb 14.4 oz (89.3 kg)  04/17/23 195 lb (88.5 kg)  04/02/23 196 lb 12.8 oz (89.3 kg)     GENERAL:alert, no distress and comfortable SKIN: skin color normal, no rashes or significant lesions EYES: normal, Conjunctiva are pink and non-injected, sclera clear  NEURO: alert & oriented x 3 with fluent speech  LABORATORY DATA:  I have reviewed the data as listed    Latest Ref Rng & Units 04/28/2023    8:27 AM 01/30/2023   10:06 AM 10/28/2022    9:00 AM  CBC  WBC 4.0 - 10.5 K/uL 5.4  4.7  5.8   Hemoglobin 12.0 - 15.0 g/dL 62.1  30.8  65.7   Hematocrit 36.0 - 46.0 % 43.1  42.5  42.2   Platelets 150 - 400 K/uL 253  282  294         Latest Ref Rng & Units 04/28/2023    8:27 AM 01/30/2023   10:06 AM 10/28/2022    9:00 AM  CMP  Glucose 70 - 99 mg/dL 90  94  91   BUN 6 - 20 mg/dL 17  16  16    Creatinine 0.44 - 1.00 mg/dL 8.46  9.62  9.52   Sodium 135 - 145 mmol/L 142  141  139   Potassium 3.5 - 5.1 mmol/L 4.4  3.9  4.2   Chloride 98 - 111 mmol/L 108  107  107   CO2 22 - 32 mmol/L 28  28  27    Calcium 8.9 - 10.3 mg/dL 9.6  9.9  9.5   Total Protein 6.5 -  8.1 g/dL 6.8  7.3  6.9   Total Bilirubin 0.3 - 1.2 mg/dL 0.5  0.6   0.5   Alkaline Phos 38 - 126 U/L 74  55  69   AST 15 - 41 U/L 28  21  31    ALT 0 - 44 U/L 42  23  45       RADIOGRAPHIC STUDIES: I have personally reviewed the radiological images as listed and agreed with the findings in the report. No results found.    No orders of the defined types were placed in this encounter.  All questions were answered. The patient knows to call the clinic with any problems, questions or concerns. No barriers to learning was detected. The total time spent in the appointment was 25 minutes.     Malachy Mood, MD 05/01/2023   Carolin Coy, CMA, am acting as scribe for Malachy Mood, MD.   I have reviewed the above documentation for accuracy and completeness, and I agree with the above.

## 2023-05-02 ENCOUNTER — Telehealth: Payer: Self-pay | Admitting: Hematology

## 2023-05-02 ENCOUNTER — Encounter: Payer: Self-pay | Admitting: Hematology

## 2023-05-03 ENCOUNTER — Other Ambulatory Visit: Payer: Self-pay

## 2023-05-05 ENCOUNTER — Other Ambulatory Visit: Payer: Self-pay

## 2023-05-05 NOTE — Telephone Encounter (Signed)
Have these prescriptions been addressed? Lorayne Marek, RN

## 2023-05-08 ENCOUNTER — Ambulatory Visit (INDEPENDENT_AMBULATORY_CARE_PROVIDER_SITE_OTHER): Payer: BC Managed Care – PPO | Admitting: Family Medicine

## 2023-05-08 ENCOUNTER — Encounter (INDEPENDENT_AMBULATORY_CARE_PROVIDER_SITE_OTHER): Payer: Self-pay | Admitting: Family Medicine

## 2023-05-08 VITALS — BP 110/62 | HR 84 | Temp 98.2°F | Ht 66.0 in | Wt 194.0 lb

## 2023-05-08 DIAGNOSIS — F5089 Other specified eating disorder: Secondary | ICD-10-CM | POA: Diagnosis not present

## 2023-05-08 DIAGNOSIS — E669 Obesity, unspecified: Secondary | ICD-10-CM

## 2023-05-08 DIAGNOSIS — R7303 Prediabetes: Secondary | ICD-10-CM | POA: Diagnosis not present

## 2023-05-08 DIAGNOSIS — Z6831 Body mass index (BMI) 31.0-31.9, adult: Secondary | ICD-10-CM

## 2023-05-08 MED ORDER — LOMAIRA 8 MG PO TABS
ORAL_TABLET | ORAL | 0 refills | Status: DC
Start: 2023-05-08 — End: 2023-05-31

## 2023-05-08 NOTE — Progress Notes (Signed)
Chief Complaint:   OBESITY [name] is here to discuss her progress with her obesity treatment plan along with follow-up of her obesity related diagnoses. [name] is on the Category 3 Plan and states she is following her eating plan approximately 75% of the time. [name] states she is walking and doing aerobics for 30-60 minutes 1-2 times per week.  Today's visit was #: 6 Starting weight: 207 lbs Starting date: 12/24/2022 Today's weight: 194 lbs Today's date: 05/08/2023 Total lbs lost to date: 13 Total lbs lost since last in-office visit: 2  Interim History: Patient has been very busy and had graduation, beach trips, getting her child ready for college, golf trips.  She is making more controlled choices and more mindful choices. She understands that if she deviates off plan she can easily get back on plan.   Subjective:   1. Prediabetes Patient's last A1c 5.9, and she is not on medications.   2. Other disorder of eating Combination of phentermine and topiramate was ordered, but she has not picked it up due to lack of coverage of [place] and inability to fill only the quality of the medication.   Assessment/Plan:   1. Prediabetes Patient will continue her Category 3 meal plan; she was encouraged to continue to weigh and measure protein.   2. Other disorder of eating Patient agreed to start phentermine at 4 mg then increase to 8 mg.   - Phentermine HCl (LOMAIRA) 8 MG TABS; Take 0.5 tablets (4 mg total) by mouth daily for 14 days, THEN 1 tablet (8 mg total) daily for 14 days.  Dispense: 23 tablet; Refill: 0  3. BMI 31.0-31.9,adult  4. Obesity with starting BMI of 33.4 [name] is currently in the action stage of change. As such, her goal is to continue with weight loss efforts. She has agreed to the Category 3 Plan.   Exercise goals: All adults should avoid inactivity. Some physical activity is better than none, and adults who participate in any amount of physical activity gain some  health benefits.  Behavioral modification strategies: increasing lean protein intake, meal planning and cooking strategies, keeping healthy foods in the home, and planning for success.  [name] has agreed to follow-up with our clinic in 3 to 4 weeks. She was informed of the importance of frequent follow-up visits to maximize her success with intensive lifestyle modifications for her multiple health conditions.   Objective:   Blood pressure 110/62, pulse 84, temperature 98.2 F (36.8 C), height 5\' 6"  (1.676 m), weight 194 lb (88 kg), SpO2 95%. Body mass index is 31.31 kg/m.  General: Cooperative, alert, well developed, in no acute distress. HEENT: Conjunctivae and lids unremarkable. Cardiovascular: Regular rhythm.  Lungs: Normal work of breathing. Neurologic: No focal deficits.   Lab Results  Component Value Date   CREATININE 0.88 04/28/2023   BUN 17 04/28/2023   NA 142 04/28/2023   K 4.4 04/28/2023   CL 108 04/28/2023   CO2 28 04/28/2023   Lab Results  Component Value Date   ALT 42 04/28/2023   AST 28 04/28/2023   ALKPHOS 74 04/28/2023   BILITOT 0.5 04/28/2023   Lab Results  Component Value Date   HGBA1C 5.9 (H) 12/24/2022   Lab Results  Component Value Date   INSULIN 12.2 12/24/2022   Lab Results  Component Value Date   TSH 2.930 12/24/2022   Lab Results  Component Value Date   CHOL 214 (H) 12/24/2022   HDL 52 12/24/2022  LDLCALC 127 (H) 12/24/2022   TRIG 199 (H) 12/24/2022   CHOLHDL 3.5 10/27/2020   Lab Results  Component Value Date   VD25OH 58.3 12/24/2022   Lab Results  Component Value Date   WBC 5.4 04/28/2023   HGB 14.5 04/28/2023   HCT 43.1 04/28/2023   MCV 87.4 04/28/2023   PLT 253 04/28/2023   Lab Results  Component Value Date   FERRITIN 99 04/01/2022   Attestation Statements:   Reviewed by clinician on day of visit: allergies, medications, problem list, medical history, surgical history, family history, social history, and previous  encounter notes.   I, Burt Knack, am acting as transcriptionist for Reuben Likes, MD.  I have reviewed the above documentation for accuracy and completeness, and I agree with the above. - Reuben Likes, MD

## 2023-05-14 ENCOUNTER — Encounter: Payer: Self-pay | Admitting: Gastroenterology

## 2023-05-14 ENCOUNTER — Ambulatory Visit (AMBULATORY_SURGERY_CENTER): Payer: BC Managed Care – PPO | Admitting: Gastroenterology

## 2023-05-14 VITALS — BP 124/64 | HR 80 | Temp 98.2°F | Resp 16 | Ht 66.0 in | Wt 195.0 lb

## 2023-05-14 DIAGNOSIS — Z86010 Personal history of colonic polyps: Secondary | ICD-10-CM

## 2023-05-14 DIAGNOSIS — D128 Benign neoplasm of rectum: Secondary | ICD-10-CM | POA: Diagnosis not present

## 2023-05-14 DIAGNOSIS — K640 First degree hemorrhoids: Secondary | ICD-10-CM

## 2023-05-14 DIAGNOSIS — Z09 Encounter for follow-up examination after completed treatment for conditions other than malignant neoplasm: Secondary | ICD-10-CM

## 2023-05-14 DIAGNOSIS — K621 Rectal polyp: Secondary | ICD-10-CM | POA: Diagnosis not present

## 2023-05-14 MED ORDER — SODIUM CHLORIDE 0.9 % IV SOLN: 500.0000 mL | Freq: Once | INTRAVENOUS | Status: DC

## 2023-05-14 MED ORDER — PANTOPRAZOLE SODIUM 40 MG PO TBEC: 40.0000 mg | DELAYED_RELEASE_TABLET | Freq: Every day | ORAL | 0 refills | Status: DC

## 2023-05-14 NOTE — Progress Notes (Signed)
Called to room to assist during endoscopic procedure.  Patient ID and intended procedure confirmed with present staff. Received instructions for my participation in the procedure from the performing physician.  

## 2023-05-14 NOTE — Patient Instructions (Signed)
   Handouts on polyps & hemorrhoids given to you today  Await pathology results on polyps removed    YOU HAD AN ENDOSCOPIC PROCEDURE TODAY AT THE [facility]:   Refer to the procedure report that was given to you for any specific questions about what was found during the examination.  If the procedure report does not answer your questions, please call your gastroenterologist to clarify.  If you requested that your care partner not be given the details of your procedure findings, then the procedure report has been included in a sealed envelope for you to review at your convenience later.  YOU SHOULD EXPECT: Some feelings of bloating in the abdomen. Passage of more gas than usual.  Walking can help get rid of the air that was put into your GI tract during the procedure and reduce the bloating. If you had a lower endoscopy (such as a colonoscopy or flexible sigmoidoscopy) you may notice spotting of blood in your stool or on the toilet paper. If you underwent a bowel prep for your procedure, you may not have a normal bowel movement for a few days.  Please Note:  You might notice some irritation and congestion in your nose or some drainage.  This is from the oxygen used during your procedure.  There is no need for concern and it should clear up in a day or so.  SYMPTOMS TO REPORT IMMEDIATELY:  Following lower endoscopy (colonoscopy or flexible sigmoidoscopy):  Excessive amounts of blood in the stool  Significant tenderness or worsening of abdominal pains  Swelling of the abdomen that is new, acute  Fever of 100F or higher   For urgent or emergent issues, a gastroenterologist can be reached at any hour by calling [phone]. Do not use MyChart messaging for urgent concerns.    DIET:  We do recommend a small meal at first, but then you may proceed to your regular diet.  Drink plenty of fluids but you should avoid alcoholic beverages for 24 hours.  ACTIVITY:  You should plan to  take it easy for the rest of today and you should NOT DRIVE or use heavy machinery until tomorrow (because of the sedation medicines used during the test).    FOLLOW UP: Our staff will call the number listed on your records the next business day following your procedure.  We will call around 7:15- 8:00 am to check on you and address any questions or concerns that you may have regarding the information given to you following your procedure. If we do not reach you, we will leave a message.     If any biopsies were taken you will be contacted by phone or by letter within the next 1-3 weeks.  Please call us at [phone] if you have not heard about the biopsies in 3 weeks.    SIGNATURES/CONFIDENTIALITY: You and/or your care partner have signed paperwork which will be entered into your electronic medical record.  These signatures attest to the fact that that the information above on your After Visit Summary has been reviewed and is understood.  Full responsibility of the confidentiality of this discharge information lies with you and/or your care-partner. 

## 2023-05-14 NOTE — Op Note (Signed)
[facility] Patient Name: [name] Procedure Date: 05/14/2023 11:34 AM MRN: [id number] Endoscopist: Doristine Locks , MD, [id number] Age: 62 Referring MD:  Date of Birth: 1961-02-07 Gender: Female Account #: [id number] Procedure:                Colonoscopy Indications:              High risk colon cancer surveillance: Personal                            history of adenoma less than 10 mm in size                           History of Ovarian Adenocarcinoma s/p resection                            03/2022 then CAPEOX 4 cycles then 1 cycle Xeloda.                           Presents today for short interval surveillance. Medicines:                Monitored Anesthesia Care Procedure:                Pre-Anesthesia Assessment:                           - Prior to the procedure, a History and Physical                            was performed, and patient medications and                            allergies were reviewed. The patient's tolerance of                            previous anesthesia was also reviewed. The risks                            and benefits of the procedure and the sedation                            options and risks were discussed with the patient.                            All questions were answered, and informed consent                            was obtained. Prior Anticoagulants: The patient has                            taken no anticoagulant or antiplatelet agents. ASA                            Grade Assessment: II - A patient with mild systemic  disease. After reviewing the risks and benefits,                            the patient was deemed in satisfactory condition to                            undergo the procedure.                           After obtaining informed consent, the colonoscope                            was passed under direct vision. Throughout the                            procedure, the patient's  blood pressure, pulse, and                            oxygen saturations were monitored continuously. The                            CF [id number] #[id number] was introduced through the anus                            and advanced to the the terminal ileum. The                            colonoscopy was performed without difficulty. The                            patient tolerated the procedure well. The quality                            of the bowel preparation was excellent. The                            terminal ileum, ileocecal valve, appendiceal                            orifice, and rectum were photographed. Scope In: 11:42:32 AM Scope Out: 11:57:44 AM Scope Withdrawal Time: 0 hours 12 minutes 15 seconds  Total Procedure Duration: 0 hours 15 minutes 12 seconds  Findings:                 The perianal and digital rectal examinations were                            normal.                           Two sessile polyps were found in the proximal                            rectum. The polyps were 2 to 3 mm in size. These  polyps were removed with a cold snare. Resection                            and retrieval were complete. Estimated blood loss                            was minimal.                           The remainder of the colon was normal. The                            colonoscope was slowly withdrawn to the rectum,                            then reintroduced back to the cecum for a 2nd slow                            inspection due to clinical history.                           Retroflexion in the right colon was performed and                            normal.                           Non-bleeding internal hemorrhoids were found during                            retroflexion. The hemorrhoids were small and Grade                            I (internal hemorrhoids that do not prolapse).                           The terminal ileum appeared  normal. Complications:            No immediate complications. Estimated Blood Loss:     Estimated blood loss was minimal. Impression:               - Two 2 to 3 mm polyps in the rectum, removed with                            a cold snare. Resected and retrieved.                           - The remainder of the colon is normal.                           - Non-bleeding internal hemorrhoids.                           - The examined portion of the ileum was normal. Recommendation:           -  Patient has a contact number available for                            emergencies. The signs and symptoms of potential                            delayed complications were discussed with the                            patient. Return to normal activities tomorrow.                            Written discharge instructions were provided to the                            patient.                           - Resume previous diet.                           - Continue present medications.                           - Await pathology results.                           - Repeat colonoscopy in 3 years for surveillance                            due to clinical history.                           - Return to GI office PRN. Doristine Locks, MD 05/14/2023 12:05:09 PM

## 2023-05-14 NOTE — Progress Notes (Signed)
GASTROENTEROLOGY PROCEDURE H&P NOTE   Primary Care Physician: Babs Sciara, MD    Reason for Procedure:   Ovarian adenocarcinoma  Plan:    Colonoscopy  Patient is appropriate for endoscopic procedure(s) in the ambulatory ([facility]) setting.  The nature of the procedure, as well as the risks, benefits, and alternatives were carefully and thoroughly reviewed with the patient. Ample time for discussion and questions allowed. The patient understood, was satisfied, and agreed to proceed.     HPI: [name] is a 62 y.o. female who presents for colonsocopy for CRC screening. Hx of left ovarian adenocarcinom s/p resection 03/2022 then CAPEOX 4 cycles then 1 cycle Xeloda. She presents today for short interval surveillance colonoscopy given the pathology from the left salpingo-oophorectomy.   - Colonoscopy (06/09/2014): 2 small sigmoid polyps removed with forceps (path benign), otherwise normal colon  - 03/28/2022: Colonoscopy: 2 subcentimeter sigmoid tubular adenomas, otherwise normal colon.  Normal TI.  Small grade 1 internal hemorrhoids.  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   BMI 35.0-35.9,adult    Cancer (HCC) 03/12/2022   colon adenoca on ovary   Fatty liver    Gall stone    GERD (gastroesophageal reflux disease)    High blood pressure    History of stroke 2005   at or after childbirth   Hyperlipidemia    Normal spontaneous vaginal delivery    2   Ovarian cancer (HCC)    Pneumonia    Pre-eclampsia     Past Surgical History:  Procedure Laterality Date   BREAST BIOPSY Left 07/15/2017   2 masses   BUNIONECTOMY  08/2021   COLONOSCOPY N/A 06/09/2014   Procedure: COLONOSCOPY;  Surgeon: Malissa Hippo, MD;  Location: [facility];  Service: Endoscopy;  Laterality: N/A;  830-moved to 945 Ann to notify pt   COLONOSCOPY   03/28/2022   HYSTEROTOMY     LAPAROSCOPIC TOTAL HYSTERECTOMY  07/18/2010   RSO   NECK SURGERY  11/2012   c6 c7   ROBOTIC ASSISTED SALPINGO OOPHERECTOMY Left 03/12/2022   Procedure: XI ROBOTIC ASSISTED LEFT SALPINGO OOPHORECTOMY WITH STAGING;  Surgeon: Carver Fila, MD;  Location: [facility];  Service: Gynecology;  Laterality: Left;    Prior to Admission medications   Medication Sig Start Date End Date Taking? Authorizing Provider  BIOTIN PO Take 4 tablets by mouth daily.   Yes [provider]  Cholecalciferol (VITAMIN D3 PO) Take 1 tablet by mouth daily.   Yes [provider]  citalopram (CELEXA) 20 MG tablet TAKE ONE TABLET (20MG  TOTAL) BY MOUTH DAILY 01/31/23  Yes Luking, Jonna Coup, MD  ibuprofen (ADVIL) 800 MG tablet Take 1 tablet (800 mg total) by mouth every 8 (eight) hours as needed for moderate pain. For AFTER surgery only 03/11/22  Yes Cross, Melissa D, NP  loratadine (CLARITIN) 10 MG tablet Take 10 mg by mouth daily.   Yes [provider]  pantoprazole (PROTONIX) 20 MG tablet Take 1 tablet (20 mg total) by mouth daily. Patient taking differently: Take 40 mg by mouth daily. 01/29/23  Yes ,  V, DO  rosuvastatin (CRESTOR) 5 MG tablet Take one tab po qd for cholesterol 05/01/23  Yes Malachy Mood, MD  MIEBO 1.338 GM/ML SOLN Apply 1 drop to eye 4 (four) times daily. 04/09/23   [provider]  Phentermine HCl (LOMAIRA) 8 MG TABS Take 0.5 tablets (4 mg total) by mouth daily for 14 days, THEN 1 tablet (8 mg total) daily for 14 days. Patient not taking: Reported on 05/14/2023 05/08/23 06/04/23  Langston Reusing, MD  topiramate (TOPAMAX) 25 MG tablet Take 1 tablet (25 mg total) by mouth daily for 14 days, THEN 2 tablets (50 mg total) daily for 14 days. Patient not taking: Reported on 04/17/2023 03/12/23 04/09/23  Langston Reusing, MD    Current Outpatient Medications  Medication Sig Dispense Refill   BIOTIN PO Take 4 tablets by mouth daily.      Cholecalciferol (VITAMIN D3 PO) Take 1 tablet by mouth daily.     citalopram (CELEXA) 20 MG tablet TAKE ONE TABLET (20MG  TOTAL) BY MOUTH DAILY 90 tablet 0   ibuprofen (ADVIL) 800 MG tablet Take 1 tablet (800 mg total) by mouth every 8 (eight) hours as needed for moderate pain. For AFTER surgery only 30 tablet 0   loratadine (CLARITIN) 10 MG tablet Take 10 mg by mouth daily.     pantoprazole (PROTONIX) 20 MG tablet Take 1 tablet (20 mg total) by mouth daily. (Patient taking differently: Take 40 mg by mouth daily.) 90 tablet 1   rosuvastatin (CRESTOR) 5 MG tablet Take one tab po qd for cholesterol 90 tablet 0   MIEBO 1.338 GM/ML SOLN Apply 1 drop to eye 4 (four) times daily.     Phentermine HCl (LOMAIRA) 8 MG TABS Take 0.5 tablets (4 mg total) by mouth daily for 14 days, THEN 1 tablet (8 mg total) daily for 14 days. (Patient not taking: Reported on 05/14/2023) 23 tablet 0   topiramate (TOPAMAX) 25 MG tablet Take 1 tablet (25 mg total) by mouth daily for 14 days, THEN 2 tablets (50 mg total) daily for 14 days. (Patient not taking: Reported on 04/17/2023) 46 tablet 0   Current Facility-Administered Medications  Medication Dose Route Frequency Provider Last Rate Last Admin   0.9 %  sodium chloride infusion  500 mL Intravenous Once ,  V, DO        Allergies as of 05/14/2023 - Review Complete 05/14/2023  Allergen Reaction Noted   Wound dressing adhesive Rash 03/13/2022    Family History  Problem Relation Age of Onset   Heart disease Mother    Cancer Father    Stroke Father    Hypertension Father    Prostate cancer Father 20   Breast cancer Sister 20       metastatic   Diabetes Brother    Prostate cancer Brother        dx after age 100; x2 brothers   Breast cancer Cousin        mat female cousin; dx 78s   Colon cancer Neg Hx    Ovarian cancer Neg Hx    Endometrial cancer Neg Hx    Pancreatic cancer Neg Hx    Stomach cancer Neg Hx    Esophageal cancer Neg Hx    Rectal cancer  Neg Hx     Social History   Socioeconomic History   Marital status: Married    Spouse name: Not on file   Number of children: 2   Years of education: Not on file   Highest education level: Not on file  Occupational History   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   Occupation: retired  Tobacco Use   Smoking status: Never   Smokeless tobacco: Never  Advertising account planner  Vaping status: Never Used  Substance and Sexual Activity   Alcohol use: Not Currently    Comment: 2-3 times/month   Drug use: No   Sexual activity: Yes    Birth control/protection: Surgical  Other Topics Concern   Not on file  Social History Narrative   Not on file   Social Determinants of Health   Financial Resource Strain: Not on file  Food Insecurity: Not on file  Transportation Needs: Not on file  Physical Activity: Not on file  Stress: Not on file  Social Connections: Not on file  Intimate Partner Violence: Not on file    Physical Exam: Vital signs in last 24 hours: @BP  (!) 140/76   Pulse 77   Temp 98.2 F (36.8 C) (Skin)   Ht 5\' 6"  (1.676 m)   Wt 195 lb (88.5 kg)   SpO2 98%   BMI 31.47 kg/m  GEN: NAD EYE: Sclerae anicteric ENT: MMM CV: Non-tachycardic Pulm: CTA b/l GI: Soft, NT/ND NEURO:  Alert & Oriented x 3   Doristine Locks, DO [facility]   05/14/2023 11:31 AM

## 2023-05-14 NOTE — Progress Notes (Signed)
Pt's states no medical or surgical changes since previsit or office visit. VS assessed by C.W 

## 2023-05-14 NOTE — Progress Notes (Signed)
Sedate, gd SR, tolerated procedure well, VSS, report to RN 

## 2023-05-15 ENCOUNTER — Telehealth: Payer: Self-pay

## 2023-05-15 NOTE — Telephone Encounter (Signed)
  Follow up Call-     05/14/2023   10:58 AM 12/11/2022    3:47 PM 03/28/2022    2:34 PM  Call back number  Post procedure Call Back phone  # [phone]  Mark's number  Permission to leave phone message Yes Yes Yes     Patient questions:  Do you have a fever, pain , or abdominal swelling? No. Pain Score  0 *  Have you tolerated food without any problems? Yes.    Have you been able to return to your normal activities? Yes.    Do you have any questions about your discharge instructions: Diet   No. Medications  No. Follow up visit  No.  Do you have questions or concerns about your Care? No.  Actions: * If pain score is 4 or above: No action needed, pain <4.

## 2023-05-20 ENCOUNTER — Other Ambulatory Visit: Payer: Self-pay

## 2023-05-20 MED ORDER — PANTOPRAZOLE SODIUM 40 MG PO TBEC: 40.0000 mg | DELAYED_RELEASE_TABLET | Freq: Every day | ORAL | 5 refills | Status: DC

## 2023-05-24 ENCOUNTER — Emergency Department (HOSPITAL_COMMUNITY): Payer: BC Managed Care – PPO

## 2023-05-24 ENCOUNTER — Encounter (HOSPITAL_COMMUNITY): Payer: Self-pay | Admitting: *Deleted

## 2023-05-24 ENCOUNTER — Emergency Department (HOSPITAL_COMMUNITY)
Admission: EM | Admit: 2023-05-24 | Discharge: 2023-05-24 | Disposition: A | Discharge status: Home / Self Care | Payer: BC Managed Care – PPO | Attending: Emergency Medicine | Admitting: Emergency Medicine

## 2023-05-24 ENCOUNTER — Other Ambulatory Visit: Payer: Self-pay

## 2023-05-24 DIAGNOSIS — R0781 Pleurodynia: Secondary | ICD-10-CM | POA: Insufficient documentation

## 2023-05-24 DIAGNOSIS — M25552 Pain in left hip: Secondary | ICD-10-CM | POA: Diagnosis not present

## 2023-05-24 DIAGNOSIS — W19XXXA Unspecified fall, initial encounter: Secondary | ICD-10-CM

## 2023-05-24 DIAGNOSIS — R0789 Other chest pain: Secondary | ICD-10-CM | POA: Insufficient documentation

## 2023-05-24 DIAGNOSIS — W010XXA Fall on same level from slipping, tripping and stumbling without subsequent striking against object, initial encounter: Secondary | ICD-10-CM | POA: Diagnosis not present

## 2023-05-24 MED ORDER — OXYCODONE HCL 5 MG PO TABS
10.0000 mg | ORAL_TABLET | Freq: Once | ORAL | Status: AC
  Administered 2023-05-24: 10 mg via ORAL
  Filled 2023-05-24: qty 2

## 2023-05-24 NOTE — ED Provider Notes (Signed)
[place] EMERGENCY DEPARTMENT AT [facility] Provider Note   CSN: [id number] Arrival date & time: 05/24/23  1717     History Chief Complaint  Patient presents with  . Fall    [name] is a 62 y.o. female.  Patient with past history significant for ovarian cancer presents to the emergency department following a mechanical fall.  She reports that there was an aching pain in and she lost her footing and landed on her left side.  Denies any head strike or head trauma.  Not currently on blood thinners.  Primarily worsening pain to the left side ribs as well as left hip.  Able to ambulate without significant difficulty.  Deep inhalation is somewhat uncomfortable patient denies any shortness of breath.  Took 1000 mg of Tylenol prior to arriving emergency department.   Fall      Home Medications Prior to Admission medications   Medication Sig Start Date End Date Taking? Authorizing Provider  BIOTIN PO Take 4 tablets by mouth daily.    [provider]  Cholecalciferol (VITAMIN D3 PO) Take 1 tablet by mouth daily.    [provider]  citalopram (CELEXA) 20 MG tablet TAKE ONE TABLET (20MG  TOTAL) BY MOUTH DAILY 01/31/23   Babs Sciara, MD  ibuprofen (ADVIL) 800 MG tablet Take 1 tablet (800 mg total) by mouth every 8 (eight) hours as needed for moderate pain. For AFTER surgery only 03/11/22   Warner Mccreedy D, NP  loratadine (CLARITIN) 10 MG tablet Take 10 mg by mouth daily.    [provider]  MIEBO 1.338 GM/ML SOLN Apply 1 drop to eye 4 (four) times daily. 04/09/23   [provider]  pantoprazole (PROTONIX) 40 MG tablet Take 1 tablet (40 mg total) by mouth daily. 05/20/23   Cirigliano, Vito V, DO  Phentermine HCl (LOMAIRA) 8 MG TABS Take 0.5 tablets (4 mg total) by mouth daily for 14 days, THEN 1 tablet (8 mg total) daily for 14 days. Patient not taking: Reported on 05/14/2023 05/08/23 06/04/23  Langston Reusing, MD  rosuvastatin (CRESTOR) 5  MG tablet Take one tab po qd for cholesterol 05/01/23   Malachy Mood, MD  topiramate (TOPAMAX) 25 MG tablet Take 1 tablet (25 mg total) by mouth daily for 14 days, THEN 2 tablets (50 mg total) daily for 14 days. Patient not taking: Reported on 04/17/2023 03/12/23 04/09/23  Langston Reusing, MD  pantoprazole (PROTONIX) 20 MG tablet Take 1 tablet (20 mg total) by mouth daily. Patient taking differently: Take 40 mg by mouth daily. 01/29/23   Cirigliano, Vito V, DO      Allergies    Wound dressing adhesive    Review of Systems   Review of Systems  Musculoskeletal:        Rib pain  All other systems reviewed and are negative.   Physical Exam Updated Vital Signs BP 114/61 (BP Location: Right Arm)   Pulse 83   Temp 97.9 F (36.6 C) (Temporal)   Resp 16   Ht 5\' 6"  (1.676 m)   Wt 87.1 kg   SpO2 97%   BMI 30.99 kg/m  Physical Exam Vitals and nursing note reviewed.  Constitutional:      General: She is not in acute distress.    Appearance: She is well-developed.  HENT:     Head: Normocephalic and atraumatic.  Eyes:     Conjunctiva/sclera: Conjunctivae normal.  Cardiovascular:     Rate and Rhythm: Normal rate  and regular rhythm.     Heart sounds: No murmur heard. Pulmonary:     Effort: Pulmonary effort is normal. No respiratory distress.     Breath sounds: Normal breath sounds. No wheezing or rales.     Comments: Breath sounds normal but some pain with deep inhalation. Abdominal:     Palpations: Abdomen is soft.     Tenderness: There is no abdominal tenderness.  Musculoskeletal:        General: Tenderness and signs of injury present. No swelling or deformity. Normal range of motion.     Cervical back: Neck supple.     Comments: TTP along the left ribs and left hip. No obvious bony deformity and no bruising noted.  Skin:    General: Skin is warm and dry.     Capillary Refill: Capillary refill takes less than 2 seconds.     Findings: No bruising.  Neurological:     Mental Status:  She is alert.  Psychiatric:        Mood and Affect: Mood normal.    ED Results / Procedures / Treatments   Labs (all labs ordered are listed, but only abnormal results are displayed) Labs Reviewed - No data to display  EKG None  Radiology No results found.  Procedures Procedures   Medications Ordered in ED Medications  oxyCODONE (Oxy IR/ROXICODONE) immediate release tablet 10 mg (has no administration in time range)    ED Course/ Medical Decision Making/ A&P                               Medical Decision Making Amount and/or Complexity of Data Reviewed Radiology: ordered.  Risk Prescription drug management.   This patient presents to the ED for concern of *** differential diagnosis includes ***    Additional history obtained:  Additional history obtained from *** External records from outside source obtained and reviewed including ***   Lab Tests:  I Ordered, and personally interpreted labs.  The pertinent results include:  ***   Imaging Studies ordered:  I ordered imaging studies including ***  I independently visualized and interpreted imaging which showed *** I agree with the radiologist interpretation   Medicines ordered and prescription drug management:  I ordered medication including ***  for ***  Reevaluation of the patient after these medicines showed that the patient {resolved/improved/worsened:23923::"improved"} I have reviewed the patients home medicines and have made adjustments as needed   Problem List / ED Course:  ***   Social Determinants of Health:    Final Clinical Impression(s) / ED Diagnoses Final diagnoses:  None    Rx / DC Orders ED Discharge Orders     None

## 2023-05-24 NOTE — ED Triage Notes (Signed)
Pt tripped and fell on uneven pavement, landing on left side, denies hitting her head. Denies any blood thinners.

## 2023-05-24 NOTE — Discharge Instructions (Signed)
You were seen in the ER today for a fall. Your xrays were thankfully negative for any signs of any fractures or lung injury. I would advise managing pain with over the counter medications such as Advil or Aleve over the next week or so. You were provided with an incentive spirometer to encourage healthy lung function to reduce the risk of pneumonia or other respiratory complications. Please plan on following up with your primary care provider for further evaluation as needed. If you are concerned that symptoms are worsening, please return to the ER.

## 2023-05-26 ENCOUNTER — Encounter: Payer: Self-pay | Admitting: Family Medicine

## 2023-05-30 ENCOUNTER — Ambulatory Visit (INDEPENDENT_AMBULATORY_CARE_PROVIDER_SITE_OTHER): Payer: BC Managed Care – PPO | Admitting: Nurse Practitioner

## 2023-05-30 VITALS — BP 113/75 | Temp 98.4°F | Ht 66.0 in | Wt 198.0 lb

## 2023-05-30 DIAGNOSIS — S20212D Contusion of left front wall of thorax, subsequent encounter: Secondary | ICD-10-CM

## 2023-05-30 MED ORDER — HYDROCODONE-ACETAMINOPHEN 5-325 MG PO TABS: ORAL_TABLET | ORAL | 0 refills | Status: DC

## 2023-05-30 NOTE — Progress Notes (Unsigned)
   Subjective:    Patient ID: [name], female    DOB: 1961-03-04, 62 y.o.   MRN: [id number]  HPI Follow up ER fall-chest wall pain Was seen in local ED on 05/24/2023 following a fall.  The only problem she is still having is her left mid chest wall pain with movement or deep breath.  Has tried ice applications.  Aleve.  Also tried her inhaler to see if would help her breathing.  Minimal improvement.  Describes the pain as 8/10.  Difficulty sleeping due to intense pain.  Has been trying to take deep breaths to prevent any pneumonia.  Review of Systems  Constitutional:  Negative for fever.  Respiratory:  Negative for cough, shortness of breath and stridor.   Cardiovascular:  Positive for chest pain.       Objective:   Physical Exam NAD.  Alert, oriented.  Lungs clear.  Heart regular rate rhythm.  Exquisite tenderness noted with palpation of the left chest wall from the posterior axillary line into the left anterior chest midclavicular line just beneath the left breast.  No rash.  No discoloration of the skin. Today's Vitals   05/30/23 1025  BP: 113/75  Temp: 98.4 F (36.9 C)  SpO2: 98%  Weight: 198 lb (89.8 kg)  Height: 5\' 6"  (1.676 m)   Body mass index is 31.96 kg/m. Chest x-ray dated 05/24/2023 is normal with no fractures.       Assessment & Plan:   Problem List Items Addressed This Visit       Musculoskeletal and Integument   Contusion of rib on left side - Primary   Meds ordered this encounter  Medications   HYDROcodone-acetaminophen (NORCO/VICODIN) 5-325 MG tablet    Sig: Take one tab po q 6 hours prn pain    Dispense:  20 tablet    Refill:  0    Order Specific Question:   Supervising Provider    Answer:   Lilyan Punt A [[id number]]   Continue to use anti-inflammatory such as naproxen or ibuprofen with caution due to history of GERD. Given one-time prescription of hydrocodone for extreme pain mainly to help her rest at night.  Ice/heat applications.  Splint the  chest wall with a pillow or her hand for deep breathing to help prevent pneumonia. Patient understands this will take weeks to resolve. Given written and verbal information on rib contusions.  Warning signs reviewed.  Call back if any new or worsening symptoms.

## 2023-05-30 NOTE — Patient Instructions (Addendum)
Lidocaine Max dose of Tylenol is 4000 mg per 24 hours   Rib Contusion A rib contusion is a deep bruise on the rib area. Contusions are the result of a blunt trauma that causes bleeding and injury to the tissues under the skin. A rib contusion may involve bruising of the ribs and of the skin and muscles in the area. The skin over the contusion may turn blue, purple, or yellow. Minor injuries result in a painless contusion. More severe contusions may be painful and swollen for a few weeks. What are the causes? This condition is usually caused by a hard, direct hit to an area of the body. This often occurs while playing contact sports. What are the signs or symptoms? Symptoms of this condition include: Swelling and redness of the injured area. Discoloration of the injured area. Tenderness and soreness of the injured area. Pain with or without movement. Pain when breathing in. How is this diagnosed? This condition may be diagnosed based on: Your symptoms and medical history. A physical exam. Imaging tests--such as an X-ray, CT scan, or MRI--to determine if there were internal injuries or broken bones (fractures). How is this treated? This condition may be treated with: Rest. This is often the best treatment for a rib contusion. Ice packs. This reduces swelling and inflammation. Deep-breathing exercises. These may be recommended to reduce the risk for lung collapse and pneumonia. Medicines. Over-the-counter or prescription medicines may be given to control pain. Injection of a numbing medicine around the nerve near your injury (nerve block). Follow these instructions at home: Medicines Take over-the-counter and prescription medicines only as told by your health care provider. Ask your health care provider if the medicine prescribed to you: Requires you to avoid driving or using machinery. Can cause constipation. You may need to take these actions to prevent or treat constipation: Drink  enough fluid to keep your urine pale yellow. Take over-the-counter or prescription medicines. Eat foods that are high in fiber, such as beans, whole grains, and fresh fruits and vegetables. Limit foods that are high in fat and processed sugars, such as fried or sweet foods. Managing pain, stiffness, and swelling If directed, put ice on the injured area. To do this: Put ice in a plastic bag. Place a towel between your skin and the bag. Leave the ice on for 20 minutes, 2-3 times a day. Remove the ice if your skin turns bright red. This is very important. If you cannot feel pain, heat, or cold, you have a greater risk of damage to the area.  Activity Rest the injured area. Avoid strenuous activity and any activities or movements that cause pain. Be careful during activities, and avoid bumping the injured area. Do not lift anything that is heavier than 5 lb (2.3 kg), or the limit that you are told, until your health care provider says that it is safe. General instructions  Do not use any products that contain nicotine or tobacco, such as cigarettes, e-cigarettes, and chewing tobacco. These can delay healing. If you need help quitting, ask your health care provider. Do deep-breathing exercises as told by your health care provider. If you were given an incentive spirometer, use it every 1-2 hours while you are awake, or as recommended by your health care provider. This device measures how well you are filling your lungs with each breath. Keep all follow-up visits. This is important. Contact a health care provider if you have: Increased bruising or swelling. Pain that is not controlled with  treatment. A fever. Get help right away if you: Have difficulty breathing or shortness of breath. Develop a continual cough, or you cough up thick or bloody mucus from your lungs (sputum). Feel nauseous or you vomit. Have pain in your abdomen. These symptoms may represent a serious problem that is an  emergency. Do not wait to see if the symptoms will go away. Get medical help right away. Call your local emergency services (911 in the [place] Do not drive yourself to the hospital. Summary A rib contusion is a deep bruise on your rib area. Contusions are the result of a blunt trauma that causes bleeding and injury to the tissues under the skin. The skin over the contusion may turn blue, purple, or yellow. Minor injuries may cause a painless contusion. More severe contusions may be painful and swollen for a few weeks. Rest the injured area. Avoid strenuous activity and any activities or movements that cause pain. This information is not intended to replace advice given to you by your health care provider. Make sure you discuss any questions you have with your health care provider. Document Revised: 12/29/2019 Document Reviewed: 12/29/2019 Elsevier Patient Education  2024 [employer].

## 2023-05-31 ENCOUNTER — Encounter: Payer: Self-pay | Admitting: Nurse Practitioner

## 2023-05-31 DIAGNOSIS — S20212A Contusion of left front wall of thorax, initial encounter: Secondary | ICD-10-CM | POA: Insufficient documentation

## 2023-06-03 ENCOUNTER — Ambulatory Visit (INDEPENDENT_AMBULATORY_CARE_PROVIDER_SITE_OTHER): Payer: BC Managed Care – PPO | Admitting: Family Medicine

## 2023-06-04 ENCOUNTER — Encounter: Payer: Self-pay | Admitting: Nurse Practitioner

## 2023-06-16 ENCOUNTER — Other Ambulatory Visit: Payer: Self-pay | Admitting: Obstetrics and Gynecology

## 2023-06-16 DIAGNOSIS — N951 Menopausal and female climacteric states: Secondary | ICD-10-CM

## 2023-06-26 ENCOUNTER — Encounter (INDEPENDENT_AMBULATORY_CARE_PROVIDER_SITE_OTHER): Payer: Self-pay | Admitting: Family Medicine

## 2023-06-26 ENCOUNTER — Other Ambulatory Visit (INDEPENDENT_AMBULATORY_CARE_PROVIDER_SITE_OTHER): Payer: Self-pay | Admitting: Family Medicine

## 2023-06-26 ENCOUNTER — Ambulatory Visit (INDEPENDENT_AMBULATORY_CARE_PROVIDER_SITE_OTHER): Payer: BC Managed Care – PPO | Admitting: Family Medicine

## 2023-06-26 VITALS — BP 110/57 | HR 74 | Temp 98.2°F | Ht 66.0 in | Wt 195.0 lb

## 2023-06-26 DIAGNOSIS — Z6831 Body mass index (BMI) 31.0-31.9, adult: Secondary | ICD-10-CM

## 2023-06-26 DIAGNOSIS — R7303 Prediabetes: Secondary | ICD-10-CM

## 2023-06-26 DIAGNOSIS — F5089 Other specified eating disorder: Secondary | ICD-10-CM

## 2023-06-26 DIAGNOSIS — E669 Obesity, unspecified: Secondary | ICD-10-CM | POA: Diagnosis not present

## 2023-06-26 DIAGNOSIS — E7849 Other hyperlipidemia: Secondary | ICD-10-CM

## 2023-06-26 MED ORDER — LOMAIRA 8 MG PO TABS
8.0000 mg | ORAL_TABLET | Freq: Every day | ORAL | 0 refills | Status: DC
Start: 2023-06-26 — End: 2023-07-21

## 2023-06-26 MED ORDER — TOPIRAMATE 50 MG PO TABS
50.0000 mg | ORAL_TABLET | Freq: Every day | ORAL | 0 refills | Status: DC
Start: 2023-06-26 — End: 2023-07-21

## 2023-06-26 NOTE — Progress Notes (Signed)
Chief Complaint:   OBESITY [name] is here to discuss her progress with her obesity treatment plan along with follow-up of her obesity related diagnoses. [name] is on the Category 3 Plan and states she is following her eating plan approximately 25% of the time. [name] states she is at the gym for 60 minutes 1 time per week.  Today's visit was #: 7 Starting weight: 207 lbs Starting date: 12/24/2022 Today's weight: 195 lbs Today's date: 06/26/2023 Total lbs lost to date: 12 Total lbs lost since last in-office visit: 0  Interim History: Patient fell on 8/17 on uneven pavement.  She mentions she is still struggling with her getting back to her norm and is better than she was. She finally did get all her medication with the lomaira and topiramate and ran out.  She did see some progress on her the scale when she was on it.    Subjective:   1. Prediabetes Patient's last A1c was 5.9 and insulin 12.2. She is not on medications.   2. Other hyperlipidemia Patient is on Crestor 5 mg daily, and she denies myalgias.   3. Other disorder of eating Patient is on combination phentermine and topiramate only for 1 month. Her starting weight was of 196 lbs and she is now at 195 lbs.   Assessment/Plan:   1. Prediabetes We will check labs, and we will follow-up at patient's next appointment.   - Comprehensive metabolic panel - Hemoglobin A1c - Insulin, random  2. Other hyperlipidemia We will check labs, and we will follow-up at patient's next appointment.   - Lipid Panel With LDL/HDL Ratio  3. Other disorder of eating Patient will continue her medications, and we will refill Lomaira 8 mg and topiramate 50 mg for 1 month.   - Phentermine HCl (LOMAIRA) 8 MG TABS; Take 1 tablet (8 mg total) by mouth daily.  Dispense: 30 tablet; Refill: 0 - topiramate (TOPAMAX) 50 MG tablet; Take 1 tablet (50 mg total) by mouth daily.  Dispense: 30 tablet; Refill: 0  4. BMI 31.0-31.9,adult  5. Obesity with  starting BMI of 33.4 Analysse is currently in the action stage of change. As such, her goal is to continue with weight loss efforts. She has agreed to the Category 3 Plan.   Exercise goals: All adults should avoid inactivity. Some physical activity is better than none, and adults who participate in any amount of physical activity gain some health benefits.  Behavioral modification strategies: increasing lean protein intake, meal planning and cooking strategies, keeping healthy foods in the home, and planning for success.  [name] has agreed to follow-up with our clinic in 4 weeks. She was informed of the importance of frequent follow-up visits to maximize her success with intensive lifestyle modifications for her multiple health conditions.   Objective:   Blood pressure (!) 110/57, pulse 74, temperature 98.2 F (36.8 C), height 5\' 6"  (1.676 m), weight 195 lb (88.5 kg), SpO2 97%. Body mass index is 31.47 kg/m.  General: Cooperative, alert, well developed, in no acute distress. HEENT: Conjunctivae and lids unremarkable. Cardiovascular: Regular rhythm.  Lungs: Normal work of breathing. Neurologic: No focal deficits.   Lab Results  Component Value Date   CREATININE 0.79 06/26/2023   BUN 13 06/26/2023   NA 144 06/26/2023   K 4.0 06/26/2023   CL 110 (H) 06/26/2023   CO2 22 06/26/2023   Lab Results  Component Value Date   ALT 18 06/26/2023   AST 16 06/26/2023  ALKPHOS 76 06/26/2023   BILITOT 0.4 06/26/2023   Lab Results  Component Value Date   HGBA1C 5.8 (H) 06/26/2023   HGBA1C 5.9 (H) 12/24/2022   Lab Results  Component Value Date   INSULIN 12.5 06/26/2023   INSULIN 12.2 12/24/2022   Lab Results  Component Value Date   TSH 2.930 12/24/2022   Lab Results  Component Value Date   CHOL 146 06/26/2023   HDL 50 06/26/2023   LDLCALC 74 06/26/2023   TRIG 126 06/26/2023   CHOLHDL 3.5 10/27/2020   Lab Results  Component Value Date   VD25OH 58.3 12/24/2022   Lab Results   Component Value Date   WBC 5.4 04/28/2023   HGB 14.5 04/28/2023   HCT 43.1 04/28/2023   MCV 87.4 04/28/2023   PLT 253 04/28/2023   Lab Results  Component Value Date   FERRITIN 99 04/01/2022   Attestation Statements:   Reviewed by clinician on day of visit: allergies, medications, problem list, medical history, surgical history, family history, social history, and previous encounter notes.   I, Burt Knack, am acting as transcriptionist for Reuben Likes, MD.  I have reviewed the above documentation for accuracy and completeness, and I agree with the above. - Reuben Likes, MD

## 2023-06-27 LAB — COMPREHENSIVE METABOLIC PANEL
ALT: 18 IU/L (ref 0–32)
AST: 16 IU/L (ref 0–40)
Albumin: 4.3 g/dL (ref 3.8–4.9)
Alkaline Phosphatase: 76 IU/L (ref 44–121)
BUN/Creatinine Ratio: 16 (ref 12–28)
BUN: 13 mg/dL (ref 8–27)
Bilirubin Total: 0.4 mg/dL (ref 0.0–1.2)
CO2: 22 mmol/L (ref 20–29)
Calcium: 9.5 mg/dL (ref 8.7–10.3)
Chloride: 110 mmol/L — ABNORMAL HIGH (ref 96–106)
Creatinine, Ser: 0.79 mg/dL (ref 0.57–1.00)
Globulin, Total: 2.2 g/dL (ref 1.5–4.5)
Glucose: 93 mg/dL (ref 70–99)
Potassium: 4 mmol/L (ref 3.5–5.2)
Sodium: 144 mmol/L (ref 134–144)
Total Protein: 6.5 g/dL (ref 6.0–8.5)
eGFR: 86 mL/min/{1.73_m2} (ref >59)

## 2023-06-27 LAB — HEMOGLOBIN A1C
Est. average glucose Bld gHb Est-mCnc: 120 mg/dL
Hgb A1c MFr Bld: 5.8 % — ABNORMAL HIGH (ref 4.8–5.6)

## 2023-06-27 LAB — LIPID PANEL WITH LDL/HDL RATIO
Cholesterol, Total: 146 mg/dL (ref 100–199)
HDL: 50 mg/dL (ref >39)
LDL Chol Calc (NIH): 74 mg/dL (ref 0–99)
LDL/HDL Ratio: 1.5 ratio (ref 0.0–3.2)
Triglycerides: 126 mg/dL (ref 0–149)
VLDL Cholesterol Cal: 22 mg/dL (ref 5–40)

## 2023-06-27 LAB — INSULIN, RANDOM: INSULIN: 12.5 u[IU]/mL (ref 2.6–24.9)

## 2023-06-30 ENCOUNTER — Ambulatory Visit: Admitting: Urology

## 2023-07-11 ENCOUNTER — Other Ambulatory Visit: Payer: Self-pay

## 2023-07-11 DIAGNOSIS — C562 Malignant neoplasm of left ovary: Secondary | ICD-10-CM

## 2023-07-13 NOTE — Assessment & Plan Note (Signed)
Stage IIB -presented with acute left abdominal pain. CT AP on 12/13/21 in ED showed a 6.6 cm hemorrhagic mass. Baseline CA 125 from 01/03/22 was WNL at 9.7. her pain resolved and no recurrent or other symptoms -S/p left salpingo-oophorectomy on 03/12/22 with Dr. Pricilla Holm. 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 I spoke with pathologist Dr. 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  -colonoscopy on 03/28/22 with Dr. Barron Alvine showed no suspicious mass, only polyps.  -baseline CEA on 04/01/22 was WNL.  -PET scan on 04/10/22 showed: no convincing evidence of hypermetabolic disease; postsurgical hypermetabolism in left adnexa and abdominal wall. -enterography on 04/15/22 showed: no small bowel masses or nodularity or tumor along omentum or mesentery; no definite appendiceal tumor. -she completed 4 cycles adjuvant CAPEOX 04/30/22 - 07/02/22 and one additional cycle of Xeloda alone.   -Surveillance CT scan from April 28, 2023 showed no evidence of recurrence, I personally reviewed the images with pt. -She is clinically doing well, lab reviewed, no concern for recurrence.  Will continue cancer surveillance.

## 2023-07-14 ENCOUNTER — Inpatient Hospital Stay: Payer: BC Managed Care – PPO | Admitting: Hematology

## 2023-07-14 ENCOUNTER — Inpatient Hospital Stay: Payer: BC Managed Care – PPO | Attending: Gynecologic Oncology

## 2023-07-14 VITALS — BP 112/63 | HR 80 | Temp 98.4°F | Resp 18 | Ht 66.0 in | Wt 198.0 lb

## 2023-07-14 DIAGNOSIS — K219 Gastro-esophageal reflux disease without esophagitis: Secondary | ICD-10-CM | POA: Diagnosis not present

## 2023-07-14 DIAGNOSIS — Z90722 Acquired absence of ovaries, bilateral: Secondary | ICD-10-CM | POA: Diagnosis not present

## 2023-07-14 DIAGNOSIS — K5730 Diverticulosis of large intestine without perforation or abscess without bleeding: Secondary | ICD-10-CM | POA: Diagnosis not present

## 2023-07-14 DIAGNOSIS — K760 Fatty (change of) liver, not elsewhere classified: Secondary | ICD-10-CM | POA: Insufficient documentation

## 2023-07-14 DIAGNOSIS — Z79899 Other long term (current) drug therapy: Secondary | ICD-10-CM | POA: Diagnosis not present

## 2023-07-14 DIAGNOSIS — S20219A Contusion of unspecified front wall of thorax, initial encounter: Secondary | ICD-10-CM | POA: Diagnosis not present

## 2023-07-14 DIAGNOSIS — K635 Polyp of colon: Secondary | ICD-10-CM | POA: Diagnosis not present

## 2023-07-14 DIAGNOSIS — Z8673 Personal history of transient ischemic attack (TIA), and cerebral infarction without residual deficits: Secondary | ICD-10-CM | POA: Diagnosis not present

## 2023-07-14 DIAGNOSIS — K8020 Calculus of gallbladder without cholecystitis without obstruction: Secondary | ICD-10-CM | POA: Diagnosis not present

## 2023-07-14 DIAGNOSIS — K648 Other hemorrhoids: Secondary | ICD-10-CM | POA: Insufficient documentation

## 2023-07-14 DIAGNOSIS — Z9221 Personal history of antineoplastic chemotherapy: Secondary | ICD-10-CM | POA: Insufficient documentation

## 2023-07-14 DIAGNOSIS — Z90710 Acquired absence of both cervix and uterus: Secondary | ICD-10-CM | POA: Diagnosis not present

## 2023-07-14 DIAGNOSIS — E785 Hyperlipidemia, unspecified: Secondary | ICD-10-CM | POA: Insufficient documentation

## 2023-07-14 DIAGNOSIS — Z8759 Personal history of other complications of pregnancy, childbirth and the puerperium: Secondary | ICD-10-CM | POA: Insufficient documentation

## 2023-07-14 DIAGNOSIS — N200 Calculus of kidney: Secondary | ICD-10-CM | POA: Diagnosis not present

## 2023-07-14 DIAGNOSIS — Z8542 Personal history of malignant neoplasm of other parts of uterus: Secondary | ICD-10-CM | POA: Insufficient documentation

## 2023-07-14 DIAGNOSIS — C562 Malignant neoplasm of left ovary: Secondary | ICD-10-CM

## 2023-07-14 LAB — CMP (CANCER CENTER ONLY)
ALT: 21 U/L (ref 0–44)
AST: 18 U/L (ref 15–41)
Albumin: 4.2 g/dL (ref 3.5–5.0)
Alkaline Phosphatase: 64 U/L (ref 38–126)
Anion gap: 6 (ref 5–15)
BUN: 21 mg/dL — ABNORMAL HIGH (ref 6–20)
CO2: 25 mmol/L (ref 22–32)
Calcium: 9.5 mg/dL (ref 8.9–10.3)
Chloride: 110 mmol/L (ref 98–111)
Creatinine: 0.83 mg/dL (ref 0.44–1.00)
GFR, Estimated: >60 mL/min (ref >60)
Glucose, Bld: 99 mg/dL (ref 70–99)
Potassium: 4 mmol/L (ref 3.5–5.1)
Sodium: 141 mmol/L (ref 135–145)
Total Bilirubin: 0.5 mg/dL (ref 0.3–1.2)
Total Protein: 7 g/dL (ref 6.5–8.1)

## 2023-07-14 LAB — CBC WITH DIFFERENTIAL (CANCER CENTER ONLY)
Abs Immature Granulocytes: 0 10*3/uL (ref 0.00–0.07)
Basophils Absolute: 0 10*3/uL (ref 0.0–0.1)
Basophils Relative: 1 %
Eosinophils Absolute: 0.3 10*3/uL (ref 0.0–0.5)
Eosinophils Relative: 6 %
HCT: 42.8 % (ref 36.0–46.0)
Hemoglobin: 14.2 g/dL (ref 12.0–15.0)
Immature Granulocytes: 0 %
Lymphocytes Relative: 43 %
Lymphs Abs: 2.1 10*3/uL (ref 0.7–4.0)
MCH: 28.9 pg (ref 26.0–34.0)
MCHC: 33.2 g/dL (ref 30.0–36.0)
MCV: 87 fL (ref 80.0–100.0)
Monocytes Absolute: 0.4 10*3/uL (ref 0.1–1.0)
Monocytes Relative: 8 %
Neutro Abs: 2 10*3/uL (ref 1.7–7.7)
Neutrophils Relative %: 42 %
Platelet Count: 302 10*3/uL (ref 150–400)
RBC: 4.92 MIL/uL (ref 3.87–5.11)
RDW: 13.2 % (ref 11.5–15.5)
WBC Count: 4.9 10*3/uL (ref 4.0–10.5)
nRBC: 0 % (ref 0.0–0.2)

## 2023-07-14 NOTE — Progress Notes (Signed)
[facility]   [phone] [phone]   Clinic Follow up Note   [facility] Care Team: Babs Sciara, MD as PCP - General (Family Medicine) Ok Edwards, MD (Inactive) as Consulting Physician (Obstetrics and Gynecology) Shirlean Kelly, MD as Consulting Physician (Neurosurgery) Malissa Hippo, MD (Inactive) as Consulting Physician (Gastroenterology) Malachy Mood, MD as Consulting Physician (Oncology)  Date of Service:  07/14/2023  CHIEF COMPLAINT: f/u of ovarian cancer  CURRENT THERAPY:  Cancer surveillance  Oncology History   Adenocarcinoma (epithelial) of ovary, left (HCC) Stage IIB -presented with acute left abdominal pain. CT [facility] on 12/13/21 in ED showed a 6.6 cm hemorrhagic mass. Baseline CA 125 from 01/03/22 was WNL at 9.7. her pain resolved and no recurrent or other symptoms -S/p left salpingo-oophorectomy on 03/12/22 with Dr. Pricilla Holm. 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 I spoke with pathologist Dr. Kenard Gower, he agrees with Susan diagnosis but pointed out that 2% ovarian cancer is intestinal type and IHC will be similar to low GI primary.  -colonoscopy on 03/28/22 with Dr. Barron Alvine showed no suspicious mass, only polyps.  -baseline CEA on 04/01/22 was WNL.  -PET scan on 04/10/22 showed: no convincing evidence of hypermetabolic disease; postsurgical hypermetabolism in left adnexa and abdominal wall. -enterography on 04/15/22 showed: no small bowel masses or nodularity or tumor along omentum or mesentery; no definite appendiceal tumor. -she completed 4 cycles adjuvant CAPEOX 04/30/22 - 07/02/22 and one additional cycle of Xeloda alone.   -Surveillance CT scan from April 28, 2023 showed no evidence of recurrence, I personally reviewed Susan images with pt. -She is clinically doing well, lab reviewed, no concern for recurrence.  Will continue cancer surveillance.    Assessment and  Plan    Ovarian Cancer Stable with no new symptoms. Blood counts, kidney and liver function are normal. It has been 1.5 years since diagnosis, and [name] is in Susan high-risk period for recurrence. -Schedule CT scan for late December 2024 for surveillance. -Follow-up appointment in Susan first week of January 2025 to review scan results.  Fall with Rib Contusions Hardin reports 90% improvement since Susan incident in August. No fractures were reported. -Continue current management.  Weight Management Hardin reports a setback due to Susan fall but is still attending a weight clinic. -Continue current weight management plan.  Plan -She is clinically doing well, no concern for recurrence -Follow-up in 3 months with lab and CT chest, abdomen pelvis with contrast 1 week before        SUMMARY OF ONCOLOGIC HISTORY: Oncology History Overview Note   Cancer Staging  Adenocarcinoma (epithelial) of ovary, left Staging form: Ovary, Fallopian Tube, and Primary Peritoneal Carcinoma, AJCC 8th Edition - Clinical stage from 04/17/2022: FIGO Stage IIB (cT2b, cN0, cM0) - Signed by Malachy Mood, MD on 04/22/2022 Stage prefix: Initial diagnosis Histologic grade (G): GX Histologic grading system: 4 grade system     Adenocarcinoma (epithelial) of ovary, left (HCC)  12/13/2021 Imaging   EXAM: CT ABDOMEN AND PELVIS WITH CONTRAST  IMPRESSION: 1. 6.6 cm hyperdense left adnexal/ovarian mass, likely a hemorrhagic cyst/mass. Recommend follow-up pelvic ultrasound. 2. 1.5 cm hyperenhancing lesion in Susan right hepatic lobe. Most likely differentials include hemangioma, FNH, adenoma. Consider nonemergent follow-up MRI abdomen with contrast.   01/03/2022 Tumor Marker   CA 125: 9.7 (WNL)   01/14/2022 Imaging   EXAM: MRI ABDOMEN WITHOUT AND WITH CONTRAST  IMPRESSION: Enhancing 13 mm segment VI hepatic lesion  measuring 13 mm demonstrates nonspecific imaging characteristics but which is favored to reflect a  benign etiology such as focal nodular hyperplasia or a hepatic adenoma. Recommend follow-up MRI in 6 months with and without EOVIST contrast for more definitive characterization and to assess stability.   03/12/2022 Pathology Results   FINAL MICROSCOPIC DIAGNOSIS:   A.   FALLOPIAN TUBE AND OVARY, LEFT, SALPINGO OOPHORECTOMY:  -    Adenocarcinoma, most consistent with metastatic colorectal  adenocarcinoma, see Comment.  -    Fallopian tube, negative for malignancy/intraepithelial carcinoma (STIC).   B.   PELVIC SIDEWALL, RIGHT, BIOPSY:  -    Benign, fibroelastic nodule.  -    Negative for malignancy.   C.   CUL DE SAC, POSTERIOR, BIOPSY:  -    Negative for malignancy.   D.   CUL DE SAC, ANTERIOR, BIOPSY:  -    Negative for malignancy.   E.   PELVIC SIDEWALL, LEFT, BIOPSY:  -    Negative for malignancy.   F.   PARACOLIC GUTTER, LEFT, BIOPSY:  -    Negative for malignancy.   G.   PARACOLIC GUTTER, RIGHT, BIOPSY:  -    Negative for malignancy.   H.   OMENTUM:  -    Negative for malignancy.   I.   OVARY REMNANT, LEFT, EXCISION:  -    Adenocarcinoma, most consistent with metastatic colorectal  adenocarcinoma.   COMMENT:  Susan tumor was interrogated with immunohistochemical (IHC) stains.  Susan tumor cells are diffusely and strongly positive for CDX-2, CK20 and mCEA and Susan tumor cells are CK7 negative.   This immunoprofile strongly favors metastatic colorectal adenocarcinoma.   Susan tumor cells are PAX8 negative to focally equivocal, a finding which is non-contributory. Primary ovarian mucinous adenocarcinoma can be CK20, CDX-2 and mCEA positive; although Susan staining pattern is typically focal and not diffuse, as in this case.  Also, supportive of a metastasis is CK7 negativity (primary ovarian mucinous carcinoma is typically CK7 positive).   There are rare cells with intracytoplasmic mucin  (mucicarmine stain).   Susan best IHC stain to differentiate between metastatic colorectal   adenocarcinoma and primary ovarian mucinous adenocarcinoma is SATB2. This marker has been ordered and will be performed at [facility].  Susan result will be reported in an addendum.   Furthermore, Susan tumor cells are negative for serous markers (p16, p53 (wildtype), WT-1) and negative for endometrioid markers (ER and vimentin).  Susan Ki-67 mitotic index is high.  Susan IHC stains and mucicarmine stain have satisfactory controls.    ADDENDUM:  Susan tumor was interrogated with SATB2 immunohistochemical (IHC) stain, performed at [facility].  Susan tumor has diffuse strong nuclear reactivity.  This result, in combination with Susan prior IHC results, is essentially diagnostic of a lower GI (colorectal) or appendiceal primary tumor.  Susan control is satisfactory.    03/28/2022 Procedure   Colonoscopy, Dr. Barron Alvine  Impression: - Two 2 to 3 mm polyps in Susan sigmoid colon, removed with a cold snare. Resected and retrieved. - Susan remainder of Susan colon was normal, including retoflexed views of Susan right colon. - Susan examined portion of Susan ileum was normal. - Non-bleeding internal hemorrhoids.   04/01/2022 Initial Diagnosis   Adenocarcinoma (epithelial) of ovary, left (HCC)   04/17/2022 Cancer Staging   Staging form: Ovary, Fallopian Tube, and Primary Peritoneal Carcinoma, AJCC 8th Edition - Clinical stage from 04/17/2022: FIGO Stage IIB (cT2b, cN0, cM0) - Signed by Malachy Mood, MD on 04/22/2022 Stage prefix: Initial  diagnosis Histologic grade (G): GX Histologic grading system: 4 grade system   04/29/2022 - 07/02/2022 Chemotherapy   Hardin is on Treatment Plan : COLORECTAL Xelox (Capeox)(130/850) q21d     04/30/2022 - 05/20/2022 Chemotherapy   Hardin is on Treatment Plan : COLORECTAL Xelox (Capeox) q21d     07/09/2022 Genetic Testing   Negative hereditary cancer genetic testing: no pathogenic variants detected in [facility] CancerNextExpanded +RNAinsight Panel.  Report date is 07/09/2022.   Susan  CancerNext-Expanded gene panel offered by [facility] and includes sequencing, rearrangement, and RNA analysis for Susan following 77 genes: AIP, ALK, APC, ATM, AXIN2, BAP1, BARD1, BLM, BMPR1A, BRCA1, BRCA2, BRIP1, CDC73, CDH1, CDK4, CDKN1B, CDKN2A, CHEK2, CTNNA1, DICER1, FANCC, FH, FLCN, GALNT12, KIF1B, LZTR1, MAX, MEN1, MET, MLH1, MSH2, MSH3, MSH6, MUTYH, NBN, NF1, NF2, NTHL1, PALB2, PHOX2B, PMS2, POT1, PRKAR1A, PTCH1, PTEN, RAD51C, RAD51D, RB1, RECQL, RET, SDHA, SDHAF2, SDHB, SDHC, SDHD, SMAD4, SMARCA4, SMARCB1, SMARCE1, STK11, SUFU, TMEM127, TP53, TSC1, TSC2, VHL and XRCC2 (sequencing and deletion/duplication); EGFR, EGLN1, HOXB13, KIT, MITF, PDGFRA, POLD1, and POLE (sequencing only); EPCAM and GREM1 (deletion/duplication only).   HRD testing is pending.    10/28/2022 Imaging    IMPRESSION: 1. Prior left salpingo-oophorectomy without evidence of local recurrence or metastatic disease within Susan chest, abdomen, or pelvis. 2. Mild symmetric distal esophageal wall thickening, correlate for symptoms of esophagitis. 3. Diffuse hepatic steatosis. 4. Cholelithiasis without findings of acute cholecystitis. 5. Nonobstructive 2 mm right renal calculus. 6. Sigmoid colonic diverticulosis without findings of acute diverticulitis.   04/28/2023 Imaging    IMPRESSION: 1. Status post hysterectomy without evidence of recurrent or metastatic disease within Susan chest, abdomen or pelvis. 2. Cholelithiasis without findings of acute cholecystitis.        Discussed Susan use of AI scribe software for clinical note transcription with [name], who gave verbal consent to proceed.  History of Present Illness   [name], a 61 year old female with a history of ovarian cancer, presents for a routine follow-up. She reports a significant fall in August, which resulted in contusions on her ribs but no fractures. She states that she is about 90% recovered from Susan fall, with only about 10% of Susan pain  remaining. Susan fall has impacted her weight management efforts, which she had been feeling good about prior to Susan incident. She denies any stomach issues and reports normal bowel movements. She is still attending a weight clinic. She denies any residual problems from chemotherapy, such as numbness or tingling. She also reports having had a recent gynecological exam, which was normal.         All other systems were reviewed with [name] and are negative.  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   BMI 35.0-35.9,adult    Cancer (HCC) 03/12/2022   colon adenoca on ovary   Fatty liver    Gall stone    GERD (gastroesophageal reflux disease)    High blood pressure    History of stroke 2005   at or after childbirth   Hyperlipidemia    Normal spontaneous vaginal delivery    2   Ovarian cancer (HCC)    Pneumonia    Pre-eclampsia     SURGICAL HISTORY: Past Surgical History:  Procedure Laterality Date   BREAST BIOPSY Left 07/15/2017   2 masses   BUNIONECTOMY  08/2021   COLONOSCOPY N/A  06/09/2014   Procedure: COLONOSCOPY;  Surgeon: Malissa Hippo, MD;  Location: [facility];  Service: Endoscopy;  Laterality: N/A;  830-moved to 945 Ann to notify pt   COLONOSCOPY  03/28/2022   HYSTEROTOMY     LAPAROSCOPIC TOTAL HYSTERECTOMY  07/18/2010   RSO   NECK SURGERY  11/2012   c6 c7   ROBOTIC ASSISTED SALPINGO OOPHERECTOMY Left 03/12/2022   Procedure: XI ROBOTIC ASSISTED LEFT SALPINGO OOPHORECTOMY WITH STAGING;  Surgeon: Carver Fila, MD;  Location: [facility];  Service: Gynecology;  Laterality: Left;    I have reviewed Susan social history and family history with [name] and they are unchanged from previous note.  ALLERGIES:  is allergic to wound dressing adhesive.  MEDICATIONS:  Current Outpatient Medications  Medication Sig Dispense Refill    BIOTIN PO Take 4 tablets by mouth daily.     Cholecalciferol (VITAMIN D3 PO) Take 1 tablet by mouth daily.     citalopram (CELEXA) 20 MG tablet TAKE ONE TABLET (20MG  TOTAL) BY MOUTH DAILY 90 tablet 0   ibuprofen (ADVIL) 800 MG tablet Take 1 tablet (800 mg total) by mouth every 8 (eight) hours as needed for moderate pain. For AFTER surgery only 30 tablet 0   loratadine (CLARITIN) 10 MG tablet Take 10 mg by mouth daily.     MIEBO 1.338 GM/ML SOLN Apply 1 drop to eye 4 (four) times daily.     pantoprazole (PROTONIX) 40 MG tablet Take 1 tablet (40 mg total) by mouth daily. 90 tablet 5   Phentermine HCl (LOMAIRA) 8 MG TABS Take 1 tablet (8 mg total) by mouth daily. 30 tablet 0   rosuvastatin (CRESTOR) 5 MG tablet Take one tab po qd for cholesterol 90 tablet 0   topiramate (TOPAMAX) 50 MG tablet Take 1 tablet (50 mg total) by mouth daily. 30 tablet 0   No current facility-administered medications for this visit.    PHYSICAL EXAMINATION: ECOG PERFORMANCE STATUS: 0 - Asymptomatic  Vitals:   07/14/23 0933  BP: 112/63  Pulse: 80  Resp: 18  Temp: 98.4 F (36.9 C)  SpO2: 99%   Wt Readings from Last 3 Encounters:  07/14/23 198 lb (89.8 kg)  06/26/23 195 lb (88.5 kg)  05/30/23 198 lb (89.8 kg)     GENERAL:alert, no distress and comfortable SKIN: skin color, texture, turgor are normal, no rashes or significant lesions EYES: normal, Conjunctiva are pink and non-injected, sclera clear NECK: supple, thyroid normal size, non-tender, without nodularity LYMPH:  no palpable lymphadenopathy in Susan cervical, axillary  LUNGS: clear to auscultation and percussion with normal breathing effort HEART: regular rate & rhythm and no murmurs and no lower extremity edema ABDOMEN:abdomen soft, non-tender and normal bowel sounds Musculoskeletal:no cyanosis of digits and no clubbing  NEURO: alert & oriented x 3 with fluent speech, no focal motor/sensory deficits   LABORATORY DATA:  I have reviewed Susan data  as listed    Latest Ref Rng & Units 07/14/2023    9:00 AM 04/28/2023    8:27 AM 01/30/2023   10:06 AM  CBC  WBC 4.0 - 10.5 K/uL 4.9  5.4  4.7   Hemoglobin 12.0 - 15.0 g/dL 40.9  81.1  91.4   Hematocrit 36.0 - 46.0 % 42.8  43.1  42.5   Platelets 150 - 400 K/uL 302  253  282         Latest Ref Rng & Units 07/14/2023    9:00 AM 06/26/2023    8:11  AM 04/28/2023    8:27 AM  CMP  Glucose 70 - 99 mg/dL 99  93  90   BUN 6 - 20 mg/dL 21  13  17    Creatinine 0.44 - 1.00 mg/dL 6.57  8.46  9.62   Sodium 135 - 145 mmol/L 141  144  142   Potassium 3.5 - 5.1 mmol/L 4.0  4.0  4.4   Chloride 98 - 111 mmol/L 110  110  108   CO2 22 - 32 mmol/L 25  22  28    Calcium 8.9 - 10.3 mg/dL 9.5  9.5  9.6   Total Protein 6.5 - 8.1 g/dL 7.0  6.5  6.8   Total Bilirubin 0.3 - 1.2 mg/dL 0.5  0.4  0.5   Alkaline Phos 38 - 126 U/L 64  76  74   AST 15 - 41 U/L 18  16  28    ALT 0 - 44 U/L 21  18  42       RADIOGRAPHIC STUDIES: I have personally reviewed Susan radiological images as listed and agreed with Susan findings in Susan report. No results found.    Orders Placed This Encounter  Procedures   CT CHEST ABDOMEN PELVIS W CONTRAST    Standing Status:   Future    Standing Expiration Date:   07/13/2024    Order Specific Question:   If indicated for Susan ordered procedure, I authorize Susan administration of contrast media per Radiology protocol    Answer:   Yes    Order Specific Question:   Does [name] have a contrast media/X-ray dye allergy?    Answer:   No    Order Specific Question:   Is Hardin pregnant?    Answer:   No    Order Specific Question:   Preferred imaging location?    Answer:   [facility]    Order Specific Question:   If indicated for Susan ordered procedure, I authorize Susan administration of oral contrast media per Radiology protocol    Answer:   Yes   All questions were answered. [name] knows to call Susan clinic with any problems, questions or concerns. No barriers to learning  was detected. Susan total time spent in Susan appointment was 25 minutes.     Malachy Mood, MD 07/14/2023

## 2023-07-20 ENCOUNTER — Encounter: Payer: Self-pay | Admitting: Family Medicine

## 2023-07-21 ENCOUNTER — Encounter (INDEPENDENT_AMBULATORY_CARE_PROVIDER_SITE_OTHER): Payer: Self-pay | Admitting: Family Medicine

## 2023-07-21 ENCOUNTER — Other Ambulatory Visit (INDEPENDENT_AMBULATORY_CARE_PROVIDER_SITE_OTHER): Payer: Self-pay | Admitting: Family Medicine

## 2023-07-21 ENCOUNTER — Ambulatory Visit (INDEPENDENT_AMBULATORY_CARE_PROVIDER_SITE_OTHER): Payer: BC Managed Care – PPO | Admitting: Family Medicine

## 2023-07-21 VITALS — BP 116/78 | HR 91 | Temp 97.9°F | Ht 66.0 in | Wt 193.0 lb

## 2023-07-21 DIAGNOSIS — Z6831 Body mass index (BMI) 31.0-31.9, adult: Secondary | ICD-10-CM | POA: Diagnosis not present

## 2023-07-21 DIAGNOSIS — E669 Obesity, unspecified: Secondary | ICD-10-CM

## 2023-07-21 DIAGNOSIS — F5089 Other specified eating disorder: Secondary | ICD-10-CM | POA: Diagnosis not present

## 2023-07-21 DIAGNOSIS — E7849 Other hyperlipidemia: Secondary | ICD-10-CM

## 2023-07-21 MED ORDER — TOPIRAMATE 50 MG PO TABS
50.0000 mg | ORAL_TABLET | Freq: Every day | ORAL | 0 refills | Status: DC
Start: 2023-07-21 — End: 2023-08-11

## 2023-07-21 MED ORDER — LOMAIRA 8 MG PO TABS
8.0000 mg | ORAL_TABLET | Freq: Every day | ORAL | 0 refills | Status: DC
Start: 2023-07-21 — End: 2023-08-11

## 2023-07-21 NOTE — Progress Notes (Unsigned)
Chief Complaint:   OBESITY [name] is here to discuss her progress with her obesity treatment plan along with follow-up of her obesity related diagnoses. [name] is on the Category 3 Plan and states she is following her eating plan approximately 2% of the time. [name] states she is doing 0 minutes 0 times per week.  Today's visit was #: 8 Starting weight: 207 lbs Starting date: 12/24/2022 Today's weight: 193 lbs Today's date: 07/21/2023 Total lbs lost to date: 14 Total lbs lost since last in-office visit: 2  Interim History: Patient has been working on greeting cards for the holidays.  She hasn't been getting enough food in and is focusing on protein.  She feels stuck in the crafting she is doing that needs to be done by the holidays.  She is almost done. She wants to get more consistent on the plan when her crafting is over.  When she fell she feels that really derailed her.   Subjective:   1. Other hyperlipidemia Patient's LDL improved from 126 to the 70s.  She is now taking Crestor daily.  She denies myalgias or transaminitis.  2. Other disorder of eating PDMP was checked with no concerns.  Her last refill was 06/26/2023.  Patient starting weight was 194 pounds and she is at 193 now.  Assessment/Plan:   1. Other hyperlipidemia Patient will continue her category 3 plan and Crestor with no change in dose or medication.  2. Other disorder of eating We will refill Lomaira 8 mg daily and Topamax 50 mg daily for 1 month; no change in dose as patient is not getting all of her food in currently.  - Phentermine HCl (LOMAIRA) 8 MG TABS; Take 1 tablet (8 mg total) by mouth daily.  Dispense: 30 tablet; Refill: 0 - topiramate (TOPAMAX) 50 MG tablet; Take 1 tablet (50 mg total) by mouth daily.  Dispense: 30 tablet; Refill: 0  3. BMI 31.0-31.9,adult  4. Obesity with starting BMI of 33.4 [name] is currently in the action stage of change. As such, her goal is to continue with weight loss  efforts. She has agreed to the Category 3 Plan.   Exercise goals: No exercise has been prescribed at this time.  Behavioral modification strategies: increasing lean protein intake, meal planning and cooking strategies, keeping healthy foods in the home, and planning for success.  [name] has agreed to follow-up with our clinic in 3 weeks. She was informed of the importance of frequent follow-up visits to maximize her success with intensive lifestyle modifications for her multiple health conditions.   Objective:   Blood pressure 116/78, pulse 91, temperature 97.9 F (36.6 C), height 5\' 6"  (1.676 m), weight 193 lb (87.5 kg), SpO2 100%. Body mass index is 31.15 kg/m.  General: Cooperative, alert, well developed, in no acute distress. HEENT: Conjunctivae and lids unremarkable. Cardiovascular: Regular rhythm.  Lungs: Normal work of breathing. Neurologic: No focal deficits.   Lab Results  Component Value Date   CREATININE 0.83 07/14/2023   BUN 21 (H) 07/14/2023   NA 141 07/14/2023   K 4.0 07/14/2023   CL 110 07/14/2023   CO2 25 07/14/2023   Lab Results  Component Value Date   ALT 21 07/14/2023   AST 18 07/14/2023   ALKPHOS 64 07/14/2023   BILITOT 0.5 07/14/2023   Lab Results  Component Value Date   HGBA1C 5.8 (H) 06/26/2023   HGBA1C 5.9 (H) 12/24/2022   Lab Results  Component Value Date   INSULIN 12.5  06/26/2023   INSULIN 12.2 12/24/2022   Lab Results  Component Value Date   TSH 2.930 12/24/2022   Lab Results  Component Value Date   CHOL 146 06/26/2023   HDL 50 06/26/2023   LDLCALC 74 06/26/2023   TRIG 126 06/26/2023   CHOLHDL 3.5 10/27/2020   Lab Results  Component Value Date   VD25OH 58.3 12/24/2022   Lab Results  Component Value Date   WBC 4.9 07/14/2023   HGB 14.2 07/14/2023   HCT 42.8 07/14/2023   MCV 87.0 07/14/2023   PLT 302 07/14/2023   Lab Results  Component Value Date   FERRITIN 99 04/01/2022   Attestation Statements:   Reviewed by  clinician on day of visit: allergies, medications, problem list, medical history, surgical history, family history, social history, and previous encounter notes.   I, Burt Knack, am acting as transcriptionist for Reuben Likes, MD. I have reviewed the above documentation for accuracy and completeness, and I agree with the above. - Reuben Likes, MD

## 2023-08-11 ENCOUNTER — Ambulatory Visit (INDEPENDENT_AMBULATORY_CARE_PROVIDER_SITE_OTHER): Payer: BC Managed Care – PPO | Admitting: Family Medicine

## 2023-08-11 ENCOUNTER — Encounter (INDEPENDENT_AMBULATORY_CARE_PROVIDER_SITE_OTHER): Payer: Self-pay | Admitting: Family Medicine

## 2023-08-11 VITALS — BP 115/74 | HR 78 | Temp 97.9°F | Ht 66.0 in | Wt 197.0 lb

## 2023-08-11 DIAGNOSIS — E669 Obesity, unspecified: Secondary | ICD-10-CM | POA: Diagnosis not present

## 2023-08-11 DIAGNOSIS — Z6831 Body mass index (BMI) 31.0-31.9, adult: Secondary | ICD-10-CM | POA: Diagnosis not present

## 2023-08-11 DIAGNOSIS — R632 Polyphagia: Secondary | ICD-10-CM | POA: Diagnosis not present

## 2023-08-11 DIAGNOSIS — R7303 Prediabetes: Secondary | ICD-10-CM | POA: Diagnosis not present

## 2023-08-11 NOTE — Progress Notes (Signed)
[facility] MEDICAL WEIGHT [facility]ALTH[facility] WEIGHT & WELLNESS AT [place] [address] [place] [address] Dept: [phone] Dept Fax: [phone]  SUBJECTIVE:  Chief Complaint: Obesity  Interim History: Patient feels bad about the scale showing a weight gain.  She feels like since she fell she cannot get a handle on losing weight again. She is hit or miss with compliant on the meal plan due to lack of hunger and decreased desire to eat. She will have a protein shake in the am that is satiating until later in the evening.  She has stopped drinking all diet drinks. She can recognize her mind is not on the plan or weight loss.  [name] is here to discuss her progress with her obesity treatment plan. She is on the Category 3 Plan and states she is following her eating plan approximately 50 % of the time. She states she is not exercising.   OBJECTIVE: Visit Diagnoses: Problem List Items Addressed This Visit       Other   Prediabetes- new o nset    Patient not on medication currently.  She is not experiencing hunger and cravings.  She is inversely not eating much.  She is aware of her decreased desire to eat.      Polyphagia    Patient on phentermine and topiramate but experiencing significant decreased ability to get food in. She has experienced a weight gain since starting medication. Will stop medication and follow up on ability to eat.      Other Visit Diagnoses     BMI 31.0-31.9,adult    -  Primary   Obesity with starting BMI of 33.4           Vitals Temp: 97.9 F (36.6 C) BP: 115/74 Pulse Rate: 78 SpO2: 97 %   Anthropometric Measurements Height: 5\' 6"  (1.676 m) Weight: 197 lb (89.4 kg) BMI (Calculated): 31.81 Weight at Last Visit: 193 lb Weight Lost Since Last Visit: 0 Weight Gained Since Last Visit: 4 Starting Weight: 207 lbs Total Weight Loss (lbs): 10 lb (4.536 kg)   Body Composition  Body Fat %: 42.7 % Fat Mass (lbs): 84.2  lbs Muscle Mass (lbs): 107 lbs Total Body Water (lbs): 73 lbs Visceral Fat Rating : 11   Other Clinical Data Today's Visit #: 9 Starting Date: 12/24/22     ASSESSMENT AND PLAN:  Diet: [name] is currently in the action stage of change. As such, her goal is to maintain weight for now. She has agreed to Category 3 Plan.  Exercise: [name] has been instructed that some exercise is better than none for weight loss and overall health benefits.   Behavior Modification:  We discussed the following Behavioral Modification Strategies today: increasing lean protein intake, increasing vegetables, meal planning and cooking strategies, and better snacking choices. We discussed various medication options to help [name] with her weight loss efforts and we both agreed to stop skipping meals, work on incorporating high protein snacks, stay consistently away from soft drinks.  No follow-ups on file.Marland Kitchen She was informed of the importance of frequent follow up visits to maximize her success with intensive lifestyle modifications for her multiple health conditions.  Attestation Statements:   Reviewed by clinician on day of visit: allergies, medications, problem list, medical history, surgical history, family history, social history, and previous encounter notes.   Time spent on visit including pre-visit chart review and post-visit care and charting was 30 minutes.    Susan Likes, MD

## 2023-08-11 NOTE — Assessment & Plan Note (Signed)
Patient not on medication currently.  She is not experiencing hunger and cravings.  She is inversely not eating much.  She is aware of her decreased desire to eat.

## 2023-08-11 NOTE — Assessment & Plan Note (Signed)
Patient on phentermine and topiramate but experiencing significant decreased ability to get food in. She has experienced a weight gain since starting medication. Will stop medication and follow up on ability to eat.

## 2023-08-25 ENCOUNTER — Other Ambulatory Visit: Payer: Self-pay | Admitting: Hematology

## 2023-08-25 NOTE — Telephone Encounter (Signed)
Good afternoon. Would you be willing to take over patient's prescription for crestor? Thank you.  -Vincent Gros, NP

## 2023-08-28 ENCOUNTER — Other Ambulatory Visit (INDEPENDENT_AMBULATORY_CARE_PROVIDER_SITE_OTHER): Payer: Self-pay | Admitting: Family Medicine

## 2023-08-28 DIAGNOSIS — F5089 Other specified eating disorder: Secondary | ICD-10-CM

## 2023-09-05 ENCOUNTER — Other Ambulatory Visit: Payer: Self-pay | Admitting: Hematology

## 2023-09-06 ENCOUNTER — Encounter: Payer: Self-pay | Admitting: Hematology

## 2023-09-06 MED ORDER — ROSUVASTATIN CALCIUM 5 MG PO TABS: ORAL_TABLET | ORAL | 0 refills | Status: DC

## 2023-09-07 ENCOUNTER — Encounter: Payer: Self-pay | Admitting: Family Medicine

## 2023-09-08 ENCOUNTER — Other Ambulatory Visit: Payer: Self-pay | Admitting: Family Medicine

## 2023-09-08 MED ORDER — ROSUVASTATIN CALCIUM 5 MG PO TABS: ORAL_TABLET | ORAL | 1 refills | Status: DC

## 2023-09-09 ENCOUNTER — Ambulatory Visit (INDEPENDENT_AMBULATORY_CARE_PROVIDER_SITE_OTHER): Payer: BC Managed Care – PPO | Admitting: Family Medicine

## 2023-10-03 ENCOUNTER — Other Ambulatory Visit: Payer: Self-pay

## 2023-10-03 ENCOUNTER — Ambulatory Visit (HOSPITAL_COMMUNITY)
Admission: RE | Admit: 2023-10-03 | Discharge: 2023-10-03 | Disposition: A | Discharge status: Home / Self Care | Payer: BC Managed Care – PPO | Source: Ambulatory Visit | Attending: Hematology | Admitting: Hematology

## 2023-10-03 DIAGNOSIS — C562 Malignant neoplasm of left ovary: Secondary | ICD-10-CM | POA: Diagnosis present

## 2023-10-03 MED ORDER — IOHEXOL 9 MG/ML PO SOLN
ORAL | Status: AC
  Filled 2023-10-03: qty 1000

## 2023-10-03 MED ORDER — IOHEXOL 9 MG/ML PO SOLN
500.0000 mL | ORAL | Status: AC
  Administered 2023-10-03 (×2): 500 mL via ORAL

## 2023-10-03 MED ORDER — IOHEXOL 300 MG/ML  SOLN
100.0000 mL | Freq: Once | INTRAMUSCULAR | Status: AC | PRN
  Administered 2023-10-03: 100 mL via INTRAVENOUS

## 2023-10-06 ENCOUNTER — Inpatient Hospital Stay: Payer: 59

## 2023-10-06 ENCOUNTER — Encounter: Payer: Self-pay | Admitting: Hematology

## 2023-10-12 ENCOUNTER — Telehealth: Payer: Self-pay | Admitting: Hematology

## 2023-10-12 NOTE — Telephone Encounter (Signed)
 Left detailed vm for patient regarding reschedule appt from 1/6 to 1/10 due to dr.feng being out

## 2023-10-13 ENCOUNTER — Inpatient Hospital Stay: Payer: 59 | Admitting: Hematology

## 2023-10-13 ENCOUNTER — Inpatient Hospital Stay: Payer: 59

## 2023-10-15 ENCOUNTER — Encounter: Payer: Self-pay | Admitting: Gastroenterology

## 2023-10-16 MED ORDER — PANTOPRAZOLE SODIUM 40 MG PO TBEC: 40.0000 mg | DELAYED_RELEASE_TABLET | Freq: Every day | ORAL | 1 refills | Status: DC

## 2023-10-17 ENCOUNTER — Inpatient Hospital Stay: Payer: 59 | Attending: Gynecologic Oncology

## 2023-10-17 ENCOUNTER — Other Ambulatory Visit: Payer: Self-pay

## 2023-10-17 ENCOUNTER — Encounter: Payer: Self-pay | Admitting: Hematology

## 2023-10-17 ENCOUNTER — Inpatient Hospital Stay (HOSPITAL_BASED_OUTPATIENT_CLINIC_OR_DEPARTMENT_OTHER): Payer: 59 | Admitting: Hematology

## 2023-10-17 ENCOUNTER — Encounter: Payer: Self-pay | Admitting: Family Medicine

## 2023-10-17 VITALS — BP 141/83 | HR 68 | Temp 98.0°F | Resp 16 | Wt 208.9 lb

## 2023-10-17 DIAGNOSIS — Z8542 Personal history of malignant neoplasm of other parts of uterus: Secondary | ICD-10-CM | POA: Diagnosis present

## 2023-10-17 DIAGNOSIS — E785 Hyperlipidemia, unspecified: Secondary | ICD-10-CM | POA: Diagnosis not present

## 2023-10-17 DIAGNOSIS — Z90710 Acquired absence of both cervix and uterus: Secondary | ICD-10-CM | POA: Diagnosis not present

## 2023-10-17 DIAGNOSIS — C562 Malignant neoplasm of left ovary: Secondary | ICD-10-CM

## 2023-10-17 DIAGNOSIS — Z9221 Personal history of antineoplastic chemotherapy: Secondary | ICD-10-CM | POA: Diagnosis not present

## 2023-10-17 DIAGNOSIS — Z90722 Acquired absence of ovaries, bilateral: Secondary | ICD-10-CM | POA: Insufficient documentation

## 2023-10-17 LAB — CMP (CANCER CENTER ONLY)
ALT: 33 U/L (ref 0–44)
AST: 20 U/L (ref 15–41)
Albumin: 4.1 g/dL (ref 3.5–5.0)
Alkaline Phosphatase: 65 U/L (ref 38–126)
Anion gap: 4 — ABNORMAL LOW (ref 5–15)
BUN: 18 mg/dL (ref 6–20)
CO2: 30 mmol/L (ref 22–32)
Calcium: 9.4 mg/dL (ref 8.9–10.3)
Chloride: 108 mmol/L (ref 98–111)
Creatinine: 0.77 mg/dL (ref 0.44–1.00)
GFR, Estimated: >60 mL/min (ref >60)
Glucose, Bld: 103 mg/dL — ABNORMAL HIGH (ref 70–99)
Potassium: 4.3 mmol/L (ref 3.5–5.1)
Sodium: 142 mmol/L (ref 135–145)
Total Bilirubin: 0.5 mg/dL (ref 0.0–1.2)
Total Protein: 6.8 g/dL (ref 6.5–8.1)

## 2023-10-17 LAB — CBC WITH DIFFERENTIAL (CANCER CENTER ONLY)
Abs Immature Granulocytes: 0.02 10*3/uL (ref 0.00–0.07)
Basophils Absolute: 0 10*3/uL (ref 0.0–0.1)
Basophils Relative: 1 %
Eosinophils Absolute: 0.3 10*3/uL (ref 0.0–0.5)
Eosinophils Relative: 6 %
HCT: 41.7 % (ref 36.0–46.0)
Hemoglobin: 13.9 g/dL (ref 12.0–15.0)
Immature Granulocytes: 0 %
Lymphocytes Relative: 44 %
Lymphs Abs: 2.1 10*3/uL (ref 0.7–4.0)
MCH: 28.5 pg (ref 26.0–34.0)
MCHC: 33.3 g/dL (ref 30.0–36.0)
MCV: 85.6 fL (ref 80.0–100.0)
Monocytes Absolute: 0.4 10*3/uL (ref 0.1–1.0)
Monocytes Relative: 8 %
Neutro Abs: 2 10*3/uL (ref 1.7–7.7)
Neutrophils Relative %: 41 %
Platelet Count: 271 10*3/uL (ref 150–400)
RBC: 4.87 MIL/uL (ref 3.87–5.11)
RDW: 12.7 % (ref 11.5–15.5)
WBC Count: 4.8 10*3/uL (ref 4.0–10.5)
nRBC: 0 % (ref 0.0–0.2)

## 2023-10-17 LAB — LIPID PANEL
Cholesterol: 165 mg/dL (ref 0–200)
HDL: 55 mg/dL (ref >40)
LDL Cholesterol: 92 mg/dL (ref 0–99)
Total CHOL/HDL Ratio: 3 {ratio}
Triglycerides: 92 mg/dL (ref <150)
VLDL: 18 mg/dL (ref 0–40)

## 2023-10-17 NOTE — Progress Notes (Signed)
 [facility]   Telephone:([phone] Fax:([phone]) [phone]   Clinic Follow up Note   Patient Care Team: Alphonsa Glendia LABOR, MD as PCP - General (Family Medicine) Winfred Curlee DEL, MD (Inactive) as Consulting Physician (Obstetrics and Gynecology) Alix Charleston, MD as Consulting Physician (Neurosurgery) Golda Claudis PENNER, MD (Inactive) as Consulting Physician (Gastroenterology) Lanny Callander, MD as Consulting Physician (Oncology)  Date of Service:  10/17/2023  CHIEF COMPLAINT: f/u of ovarian cancer  CURRENT THERAPY:  Surveillance  Oncology History   Adenocarcinoma (epithelial) of ovary, left (HCC) Stage IIB -presented with acute left abdominal pain. CT AP on 12/13/21 in ED showed a 6.6 cm hemorrhagic mass. Baseline CA 125 from 01/03/22 was WNL at 9.7. her pain resolved and no recurrent or other symptoms -S/p left salpingo-oophorectomy on 03/12/22 with Dr. Viktoria. 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 I spoke with pathologist Dr. Rebbecca, he agrees with the diagnosis but pointed out that 2% ovarian cancer is intestinal type and IHC will be similar to low GI primary.  -colonoscopy on 03/28/22 with Dr. San showed no suspicious mass, only polyps.  -baseline CEA on 04/01/22 was WNL.  -PET scan on 04/10/22 showed: no convincing evidence of hypermetabolic disease; postsurgical hypermetabolism in left adnexa and abdominal wall. -enterography on 04/15/22 showed: no small bowel masses or nodularity or tumor along omentum or mesentery; no definite appendiceal tumor. -she completed 4 cycles adjuvant CAPEOX 04/30/22 - 07/02/22 and one additional cycle of Xeloda  alone.   -Surveillance CT scan from 10/03/2023 showed no evidence of recurrence, I personally reviewed the images with pt. -She is clinically doing well, lab reviewed, no concern for recurrence.  Will continue cancer surveillance.   Assessment and Plan     Ovarian Cancer Follow-Up 63 year old here for follow-up of ovarian cancer. Reports no new symptoms, abdominal pain, or changes in bowel movements. Recent scan shows no recurrence, only surgical changes. Blood counts are normal; kidney and liver function tests pending but previously normal. Approaching two-year post-treatment mark, a critical period for recurrence risk, which decreases significantly after three years. - Monitor every four months for this year - Schedule next scan in one year - Educate on symptoms to watch for, including abdominal pain, cramps, unexplained weight loss, and bloating - Advise to request an early appointment if any concerning symptoms arise  Hyperlipidemia Lipid panel requested to monitor cholesterol levels. Last lipid panel in September; currently on cholesterol medication. Fasting except for a few sips of tea. Plan to share results with Dr. Duwaine. - Order lipid panel - Share lipid panel results with Dr. Duwaine  General Health Maintenance Recovered from recent flu. Received flu shot, which may have mitigated severity. - Continue annual flu vaccinations  Plan -Lab and CT scan reviewed, NED, will continue cancer surveillance - Schedule follow-up appointment in four months.         SUMMARY OF ONCOLOGIC HISTORY: Oncology History Overview Note   Cancer Staging  Adenocarcinoma (epithelial) of ovary, left Staging form: Ovary, Fallopian Tube, and Primary Peritoneal Carcinoma, AJCC 8th Edition - Clinical stage from 04/17/2022: FIGO Stage IIB (cT2b, cN0, cM0) - Signed by Lanny Callander, MD on 04/22/2022 Stage prefix: Initial diagnosis Histologic grade (G): GX Histologic grading system: 4 grade system     Adenocarcinoma (epithelial) of ovary, left (HCC)  12/13/2021 Imaging   EXAM: CT ABDOMEN AND PELVIS WITH CONTRAST  IMPRESSION: 1. 6.6 cm hyperdense left adnexal/ovarian mass, likely a hemorrhagic cyst/mass. Recommend follow-up pelvic  ultrasound. 2. 1.5 cm  hyperenhancing lesion in the right hepatic lobe. Most likely differentials include hemangioma, FNH, adenoma. Consider nonemergent follow-up MRI abdomen with contrast.   01/03/2022 Tumor Marker   CA 125: 9.7 (WNL)   01/14/2022 Imaging   EXAM: MRI ABDOMEN WITHOUT AND WITH CONTRAST  IMPRESSION: Enhancing 13 mm segment VI hepatic lesion measuring 13 mm demonstrates nonspecific imaging characteristics but which is favored to reflect a benign etiology such as focal nodular hyperplasia or a hepatic adenoma. Recommend follow-up MRI in 6 months with and without EOVIST contrast for more definitive characterization and to assess stability.   03/12/2022 Pathology Results   FINAL MICROSCOPIC DIAGNOSIS:   A.   FALLOPIAN TUBE AND OVARY, LEFT, SALPINGO OOPHORECTOMY:  -    Adenocarcinoma, most consistent with metastatic colorectal  adenocarcinoma, see Comment.  -    Fallopian tube, negative for malignancy/intraepithelial carcinoma (STIC).   B.   PELVIC SIDEWALL, RIGHT, BIOPSY:  -    Benign, fibroelastic nodule.  -    Negative for malignancy.   C.   CUL DE SAC, POSTERIOR, BIOPSY:  -    Negative for malignancy.   D.   CUL DE SAC, ANTERIOR, BIOPSY:  -    Negative for malignancy.   E.   PELVIC SIDEWALL, LEFT, BIOPSY:  -    Negative for malignancy.   F.   PARACOLIC GUTTER, LEFT, BIOPSY:  -    Negative for malignancy.   G.   PARACOLIC GUTTER, RIGHT, BIOPSY:  -    Negative for malignancy.   H.   OMENTUM:  -    Negative for malignancy.   I.   OVARY REMNANT, LEFT, EXCISION:  -    Adenocarcinoma, most consistent with metastatic colorectal  adenocarcinoma.   COMMENT:  The tumor was interrogated with immunohistochemical (IHC) stains.  The tumor cells are diffusely and strongly positive for CDX-2, CK20 and mCEA and the tumor cells are CK7 negative.   This immunoprofile strongly favors metastatic colorectal adenocarcinoma.   The tumor cells are PAX8 negative to focally equivocal, a finding which  is non-contributory. Primary ovarian mucinous adenocarcinoma can be CK20, CDX-2 and mCEA positive; although the staining pattern is typically focal and not diffuse, as in this case.  Also, supportive of a metastasis is CK7 negativity (primary ovarian mucinous carcinoma is typically CK7 positive).   There are rare cells with intracytoplasmic mucin  (mucicarmine stain).   The best IHC stain to differentiate between metastatic colorectal  adenocarcinoma and primary ovarian mucinous adenocarcinoma is SATB2. This marker has been ordered and will be performed at [facility].  The result will be reported in an addendum.   Furthermore, the tumor cells are negative for serous markers (p16, p53 (wildtype), WT-1) and negative for endometrioid markers (ER and vimentin).  The Ki-67 mitotic index is high.  The IHC stains and mucicarmine stain have satisfactory controls.    ADDENDUM:  The tumor was interrogated with SATB2 immunohistochemical (IHC) stain, performed at [facility].  The tumor has diffuse strong nuclear reactivity.  This result, in combination with the prior IHC results, is essentially diagnostic of a lower GI (colorectal) or appendiceal primary tumor.  The control is satisfactory.    03/28/2022 Procedure   Colonoscopy, Dr. San  Impression: - Two 2 to 3 mm polyps in the sigmoid colon, removed with a cold snare. Resected and retrieved. - The remainder of the colon was normal, including retoflexed views of the right colon. - The examined portion of the ileum was normal. -  Non-bleeding internal hemorrhoids.   04/01/2022 Initial Diagnosis   Adenocarcinoma (epithelial) of ovary, left (HCC)   04/17/2022 Cancer Staging   Staging form: Ovary, Fallopian Tube, and Primary Peritoneal Carcinoma, AJCC 8th Edition - Clinical stage from 04/17/2022: FIGO Stage IIB (cT2b, cN0, cM0) - Signed by Lanny Callander, MD on 04/22/2022 Stage prefix: Initial diagnosis Histologic grade (G): GX Histologic grading system:  4 grade system   04/29/2022 - 07/02/2022 Chemotherapy   Patient is on Treatment Plan : COLORECTAL Xelox (Capeox)(130/850) q21d     04/30/2022 - 05/20/2022 Chemotherapy   Patient is on Treatment Plan : COLORECTAL Xelox (Capeox) q21d     07/09/2022 Genetic Testing   Negative hereditary cancer genetic testing: no pathogenic variants detected in [facility] CancerNextExpanded +RNAinsight Panel.  Report date is 07/09/2022.   The CancerNext-Expanded gene panel offered by [facility] and includes sequencing, rearrangement, and RNA analysis for the following 77 genes: AIP, ALK, APC, ATM, AXIN2, BAP1, BARD1, BLM, BMPR1A, BRCA1, BRCA2, BRIP1, CDC73, CDH1, CDK4, CDKN1B, CDKN2A, CHEK2, CTNNA1, DICER1, FANCC, FH, FLCN, GALNT12, KIF1B, LZTR1, MAX, MEN1, MET, MLH1, MSH2, MSH3, MSH6, MUTYH, NBN, NF1, NF2, NTHL1, PALB2, PHOX2B, PMS2, POT1, PRKAR1A, PTCH1, PTEN, RAD51C, RAD51D, RB1, RECQL, RET, SDHA, SDHAF2, SDHB, SDHC, SDHD, SMAD4, SMARCA4, SMARCB1, SMARCE1, STK11, SUFU, TMEM127, TP53, TSC1, TSC2, VHL and XRCC2 (sequencing and deletion/duplication); EGFR, EGLN1, HOXB13, KIT, MITF, PDGFRA, POLD1, and POLE (sequencing only); EPCAM and GREM1 (deletion/duplication only).   HRD testing is pending.    10/28/2022 Imaging    IMPRESSION: 1. Prior left salpingo-oophorectomy without evidence of local recurrence or metastatic disease within the chest, abdomen, or pelvis. 2. Mild symmetric distal esophageal wall thickening, correlate for symptoms of esophagitis. 3. Diffuse hepatic steatosis. 4. Cholelithiasis without findings of acute cholecystitis. 5. Nonobstructive 2 mm right renal calculus. 6. Sigmoid colonic diverticulosis without findings of acute diverticulitis.   04/28/2023 Imaging    IMPRESSION: 1. Status post hysterectomy without evidence of recurrent or metastatic disease within the chest, abdomen or pelvis. 2. Cholelithiasis without findings of acute cholecystitis.        Discussed the use of AI scribe  software for clinical note transcription with the patient, who gave verbal consent to proceed.  History of Present Illness   The patient, a 63 year old female with a history of ovarian cancer, presents for a routine follow-up. She reports feeling well with no new symptoms. She denies any abdominal pain, changes in bowel habits, or unexplained weight loss. She has no concerns and is pleased with her recent scan results, which showed no signs of recurrence. The patient also had a blood test today, including a lipid panel at the request of her primary care physician. She reports taking Celexa , managed by her primary care physician.         All other systems were reviewed with the patient and are negative.  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   BMI 35.0-35.9,adult    Cancer (HCC) 03/12/2022   colon adenoca on ovary   Fatty liver    Gall stone    GERD (gastroesophageal reflux disease)    High blood pressure    History of stroke 2005   at or after childbirth   Hyperlipidemia    Normal spontaneous vaginal delivery    2   Ovarian cancer (HCC)    Pneumonia    Pre-eclampsia  SURGICAL HISTORY: Past Surgical History:  Procedure Laterality Date   BREAST BIOPSY Left 07/15/2017   2 masses   BUNIONECTOMY  08/2021   COLONOSCOPY N/A 06/09/2014   Procedure: COLONOSCOPY;  Surgeon: Claudis RAYMOND Rivet, MD;  Location: [facility];  Service: Endoscopy;  Laterality: N/A;  830-moved to 945 Ann to notify pt   COLONOSCOPY  03/28/2022   HYSTEROTOMY     LAPAROSCOPIC TOTAL HYSTERECTOMY  07/18/2010   RSO   NECK SURGERY  11/2012   c6 c7   ROBOTIC ASSISTED SALPINGO OOPHERECTOMY Left 03/12/2022   Procedure: XI ROBOTIC ASSISTED LEFT SALPINGO OOPHORECTOMY WITH STAGING;  Surgeon: Viktoria Comer SAUNDERS, MD;  Location: [facility];  Service: Gynecology;  Laterality: Left;    I  have reviewed the social history and family history with the patient and they are unchanged from previous note.  ALLERGIES:  is allergic to wound dressing adhesive.  MEDICATIONS:  Current Outpatient Medications  Medication Sig Dispense Refill   BIOTIN PO Take 4 tablets by mouth daily.     Cholecalciferol (VITAMIN D3 PO) Take 1 tablet by mouth daily.     citalopram  (CELEXA ) 20 MG tablet TAKE ONE TABLET (20MG  TOTAL) BY MOUTH DAILY 90 tablet 0   ibuprofen  (ADVIL ) 800 MG tablet Take 1 tablet (800 mg total) by mouth every 8 (eight) hours as needed for moderate pain. For AFTER surgery only 30 tablet 0   loratadine (CLARITIN) 10 MG tablet Take 10 mg by mouth daily.     MIEBO 1.338 GM/ML SOLN Apply 1 drop to eye 4 (four) times daily.     pantoprazole  (PROTONIX ) 40 MG tablet Take 1 tablet (40 mg total) by mouth daily. 90 tablet 1   rosuvastatin  (CRESTOR ) 5 MG tablet Take one tab po qd for cholesterol 90 tablet 1   No current facility-administered medications for this visit.    PHYSICAL EXAMINATION: ECOG PERFORMANCE STATUS: 0 - Asymptomatic  Vitals:   10/17/23 0903 10/17/23 0906  BP: (!) 135/94 (!) 141/83  Pulse: 68   Resp: 16   Temp: 98 F (36.7 C)   SpO2: 97%    Wt Readings from Last 3 Encounters:  10/17/23 208 lb 14.4 oz (94.8 kg)  08/11/23 197 lb (89.4 kg)  07/21/23 193 lb (87.5 kg)     GENERAL:alert, no distress and comfortable SKIN: skin color, texture, turgor are normal, no rashes or significant lesions EYES: normal, Conjunctiva are pink and non-injected, sclera clear NECK: supple, thyroid normal size, non-tender, without nodularity LYMPH:  no palpable lymphadenopathy in the cervical, axillary  LUNGS: clear to auscultation and percussion with normal breathing effort HEART: regular rate & rhythm and no murmurs and no lower extremity edema ABDOMEN:abdomen soft, non-tender and normal bowel sounds Musculoskeletal:no cyanosis of digits and no clubbing  NEURO: alert & oriented x  3 with fluent speech, no focal motor/sensory deficits   LABORATORY DATA:  I have reviewed the data as listed    Latest Ref Rng & Units 10/17/2023    8:48 AM 07/14/2023    9:00 AM 04/28/2023    8:27 AM  CBC  WBC 4.0 - 10.5 K/uL 4.8  4.9  5.4   Hemoglobin 12.0 - 15.0 g/dL 86.0  85.7  85.4   Hematocrit 36.0 - 46.0 % 41.7  42.8  43.1   Platelets 150 - 400 K/uL 271  302  253         Latest Ref Rng & Units 10/17/2023    8:48 AM 07/14/2023  9:00 AM 06/26/2023    8:11 AM  CMP  Glucose 70 - 99 mg/dL 896  99  93   BUN 6 - 20 mg/dL 18  21  13    Creatinine 0.44 - 1.00 mg/dL 9.22  9.16  9.20   Sodium 135 - 145 mmol/L 142  141  144   Potassium 3.5 - 5.1 mmol/L 4.3  4.0  4.0   Chloride 98 - 111 mmol/L 108  110  110   CO2 22 - 32 mmol/L 30  25  22    Calcium  8.9 - 10.3 mg/dL 9.4  9.5  9.5   Total Protein 6.5 - 8.1 g/dL 6.8  7.0  6.5   Total Bilirubin 0.0 - 1.2 mg/dL 0.5  0.5  0.4   Alkaline Phos 38 - 126 U/L 65  64  76   AST 15 - 41 U/L 20  18  16    ALT 0 - 44 U/L 33  21  18       RADIOGRAPHIC STUDIES: I have personally reviewed the radiological images as listed and agreed with the findings in the report. No results found.    Orders Placed This Encounter  Procedures   Lipid panel    Standing Status:   Future    Number of Occurrences:   1    Expiration Date:   10/16/2024   All questions were answered. The patient knows to call the clinic with any problems, questions or concerns. No barriers to learning was detected. The total time spent in the appointment was 20 minutes.     Onita Mattock, MD 10/17/2023

## 2023-10-17 NOTE — Telephone Encounter (Signed)
 Flu vaccine added in EPIC

## 2023-10-17 NOTE — Assessment & Plan Note (Addendum)
 Stage IIB -presented with acute left abdominal pain. CT AP on 12/13/21 in ED showed a 6.6 cm hemorrhagic mass. Baseline CA 125 from 01/03/22 was WNL at 9.7. her pain resolved and no recurrent or other symptoms -S/p left salpingo-oophorectomy on 03/12/22 with Dr. Orvil Bland. 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 I spoke with pathologist Dr. Bernetta Brilliant, he agrees with the diagnosis but pointed out that 2% ovarian cancer is intestinal type and IHC will be similar to low GI primary.  -colonoscopy on 03/28/22 with Dr. Karene Oto showed no suspicious mass, only polyps.  -baseline CEA on 04/01/22 was WNL.  -PET scan on 04/10/22 showed: no convincing evidence of hypermetabolic disease; postsurgical hypermetabolism in left adnexa and abdominal wall. -enterography on 04/15/22 showed: no small bowel masses or nodularity or tumor along omentum or mesentery; no definite appendiceal tumor. -she completed 4 cycles adjuvant CAPEOX 04/30/22 - 07/02/22 and one additional cycle of Xeloda  alone.   -Surveillance CT scan from 10/03/2023 showed no evidence of recurrence, I personally reviewed the images with pt. -She is clinically doing well, lab reviewed, no concern for recurrence.  Will continue cancer surveillance.

## 2023-11-03 ENCOUNTER — Other Ambulatory Visit: Payer: Self-pay | Admitting: Hematology

## 2023-11-03 DIAGNOSIS — Z1231 Encounter for screening mammogram for malignant neoplasm of breast: Secondary | ICD-10-CM

## 2023-11-06 ENCOUNTER — Encounter: Payer: Self-pay | Admitting: Physician Assistant

## 2023-11-06 ENCOUNTER — Ambulatory Visit: Payer: 59 | Admitting: Physician Assistant

## 2023-11-06 DIAGNOSIS — R109 Unspecified abdominal pain: Secondary | ICD-10-CM | POA: Diagnosis not present

## 2023-11-06 DIAGNOSIS — R300 Dysuria: Secondary | ICD-10-CM

## 2023-11-06 DIAGNOSIS — R3129 Other microscopic hematuria: Secondary | ICD-10-CM

## 2023-11-06 LAB — POCT URINALYSIS DIP (CLINITEK)
Bilirubin, UA: NEGATIVE
Glucose, UA: NEGATIVE mg/dL
Ketones, POC UA: NEGATIVE mg/dL
Leukocytes, UA: NEGATIVE
Nitrite, UA: NEGATIVE
POC PROTEIN,UA: NEGATIVE
Spec Grav, UA: 1.015 (ref 1.010–1.025)
Urobilinogen, UA: 0.2 U/dL
pH, UA: 7 (ref 5.0–8.0)

## 2023-11-06 MED ORDER — CITALOPRAM HYDROBROMIDE 20 MG PO TABS: 20.0000 mg | ORAL_TABLET | Freq: Every day | ORAL | 2 refills | Status: DC

## 2023-11-06 NOTE — Progress Notes (Signed)
Acute Office Visit  Subjective:     Patient ID: [name], female    DOB: Dec 24, 1960, 63 y.o.   MRN: [id number]   HPI Patient is in today for bladder pressure and left flank pain. Patient states symptoms began approximately 1 week ago. She denies dysuria, fevers, nausea, or vomiting. She denies hematuria, decreased energy levels or weight loss. Patient does have past medical history of ovarian cancer with last CT scan in 12/24. Scan without abnormal findings.   Review of Systems  Constitutional:  Negative for chills, fever and weight loss.  Respiratory:  Negative for cough and hemoptysis.   Cardiovascular:  Negative for chest pain and palpitations.  Gastrointestinal:  Negative for abdominal pain, nausea and vomiting.  Genitourinary:  Positive for flank pain. Negative for dysuria, frequency, hematuria and urgency.  Musculoskeletal:  Negative for joint pain and myalgias.        Objective:     BP 118/88   Pulse 78   Temp (!) 97.5 F (36.4 C)   Ht 5\' 6"  (1.676 m)   Wt 208 lb (94.3 kg)   SpO2 96%   BMI 33.57 kg/m   Physical Exam Constitutional:      Appearance: Normal appearance.  HENT:     Head: Normocephalic.     Mouth/Throat:     Mouth: Mucous membranes are moist.     Pharynx: Oropharynx is clear.  Eyes:     Extraocular Movements: Extraocular movements intact.     Conjunctiva/sclera: Conjunctivae normal.  Cardiovascular:     Rate and Rhythm: Normal rate and regular rhythm.     Heart sounds: No murmur heard.    No gallop.  Pulmonary:     Effort: Pulmonary effort is normal.     Breath sounds: No wheezing, rhonchi or rales.  Abdominal:     General: Abdomen is flat.     Palpations: Abdomen is soft.     Tenderness: There is no abdominal tenderness. There is no right CVA tenderness or left CVA tenderness.  Skin:    General: Skin is warm and dry.  Neurological:     General: No focal deficit present.     Mental Status: She is alert and oriented to person,  place, and time.  Psychiatric:        Mood and Affect: Mood normal.        Behavior: Behavior normal.     Results for orders placed or performed in visit on 11/06/23  POCT URINALYSIS DIP (CLINITEK)  Result Value Ref Range   Color, UA yellow yellow   Clarity, UA clear clear   Glucose, UA negative negative mg/dL   Bilirubin, UA negative negative   Ketones, POC UA negative negative mg/dL   Spec Grav, UA 4.098 1.191 - 1.025   Blood, UA trace-intact (A) negative   pH, UA 7.0 5.0 - 8.0   POC PROTEIN,UA negative negative, trace   Urobilinogen, UA 0.2 0.2 or 1.0 E.U./dL   Nitrite, UA Negative Negative   Leukocytes, UA Negative Negative        Assessment & Plan:  Acute left flank pain -     CT ABDOMEN PELVIS WO CONTRAST -     Urine Culture  Dysuria -     POCT URINALYSIS DIP (CLINITEK) -     Urine Culture  Microscopic hematuria -     CT ABDOMEN PELVIS WO CONTRAST -     Ambulatory referral to Urology -     Urine Culture  Other orders -     Citalopram Hydrobromide; Take 1 tablet (20 mg total) by mouth daily.  Dispense: 90 tablet; Refill: 2   Patient appears stable today, physical exam without abnormal findings. Last lab results from 10/17/23 reviewed today and appear stable as well. Urine dipstick positive for blood, otherwise urine is clean. Urine sent for culture to rule out cystitis. With her history of ovarian cancer, CT ordered to rule out reoccurrence or potential kidney stone. Referral placed to urology for further workup of microscopic hematuria. Tylenol/ibuprofen for pain relief as needed. She will follow up as needed or if symptoms worsen.   Return if symptoms worsen or fail to improve.  Toni Amend Athalene Kolle, PA-C

## 2023-11-07 ENCOUNTER — Encounter: Payer: Self-pay | Admitting: Family Medicine

## 2023-11-07 ENCOUNTER — Encounter: Payer: Self-pay | Admitting: Hematology

## 2023-11-08 ENCOUNTER — Encounter: Payer: Self-pay | Admitting: Family Medicine

## 2023-11-08 ENCOUNTER — Telehealth: Payer: Self-pay | Admitting: Family Medicine

## 2023-11-08 LAB — URINE CULTURE

## 2023-11-08 NOTE — Telephone Encounter (Signed)
Nurses  The patient sent a MyChart message that I responded to. She will be coming by the office on Monday to resubmit a urine specimen for UA along with urine culture due to dysuria  Her last urine was contaminated so please instruct her on proper way to do a urine clean-catch (Proper wiping beforehand, catching a midstream urine, etc.)  Then please do dipstick and microscopic spin Also send another urine culture Also bring this to my attention when she has urine ready to look at (It would be helpful to also create telephone message or addendum to this telephone message when that occurs)  Thanks-Dr. Lorin Picket

## 2023-11-09 ENCOUNTER — Encounter: Payer: Self-pay | Admitting: Physician Assistant

## 2023-11-10 ENCOUNTER — Telehealth: Payer: Self-pay | Admitting: Family Medicine

## 2023-11-10 ENCOUNTER — Other Ambulatory Visit (INDEPENDENT_AMBULATORY_CARE_PROVIDER_SITE_OTHER): Payer: Self-pay

## 2023-11-10 ENCOUNTER — Encounter: Payer: Self-pay | Admitting: Family Medicine

## 2023-11-10 DIAGNOSIS — R300 Dysuria: Secondary | ICD-10-CM | POA: Diagnosis not present

## 2023-11-10 DIAGNOSIS — R109 Unspecified abdominal pain: Secondary | ICD-10-CM

## 2023-11-10 DIAGNOSIS — Z8543 Personal history of malignant neoplasm of ovary: Secondary | ICD-10-CM

## 2023-11-10 DIAGNOSIS — R102 Pelvic and perineal pain: Secondary | ICD-10-CM

## 2023-11-10 LAB — POCT URINALYSIS DIP (CLINITEK)
Bilirubin, UA: NEGATIVE
Blood, UA: NEGATIVE
Glucose, UA: NEGATIVE mg/dL
Ketones, POC UA: NEGATIVE mg/dL
Nitrite, UA: NEGATIVE
POC PROTEIN,UA: NEGATIVE
Spec Grav, UA: 1.01 (ref 1.010–1.025)
Urobilinogen, UA: 0.2 U/dL
pH, UA: 6 (ref 5.0–8.0)

## 2023-11-10 NOTE — Telephone Encounter (Signed)
Nurses Due to the increased pain in the left lower pelvic area and left back region along with history of ovarian cancer and urinary symptoms it is necessary to see if radiology can move her scan up to this week  The CAT scan actually needs to be a CT scan of the abdomen pelvis with contrast because of her cancer history thanks-Dr. Lorin Picket If necessary the CAT scan can be scheduled as a stat scan thank you  She also has a history of kidney stones as well

## 2023-11-10 NOTE — Telephone Encounter (Signed)
Patient dropped off urine that was checked by provider and sent for culture. Patient notified of results by provider via my chart

## 2023-11-11 ENCOUNTER — Encounter: Payer: Self-pay | Admitting: Family Medicine

## 2023-11-12 ENCOUNTER — Ambulatory Visit (HOSPITAL_COMMUNITY)
Admission: RE | Admit: 2023-11-12 | Discharge: 2023-11-12 | Disposition: A | Discharge status: Home / Self Care | Payer: 59 | Source: Ambulatory Visit | Attending: Family Medicine | Admitting: Family Medicine

## 2023-11-12 ENCOUNTER — Encounter: Payer: Self-pay | Admitting: Family Medicine

## 2023-11-12 DIAGNOSIS — R109 Unspecified abdominal pain: Secondary | ICD-10-CM | POA: Insufficient documentation

## 2023-11-12 DIAGNOSIS — Z8543 Personal history of malignant neoplasm of ovary: Secondary | ICD-10-CM | POA: Insufficient documentation

## 2023-11-12 DIAGNOSIS — R102 Pelvic and perineal pain: Secondary | ICD-10-CM | POA: Insufficient documentation

## 2023-11-12 LAB — URINE CULTURE

## 2023-11-12 LAB — SPECIMEN STATUS REPORT

## 2023-11-12 MED ORDER — IOHEXOL 300 MG/ML  SOLN
100.0000 mL | Freq: Once | INTRAMUSCULAR | Status: AC | PRN
  Administered 2023-11-12: 100 mL via INTRAVENOUS

## 2023-11-12 MED ORDER — IOHEXOL 300 MG/ML  SOLN
30.0000 mL | Freq: Once | INTRAMUSCULAR | Status: AC | PRN
  Administered 2023-11-12: 30 mL via ORAL

## 2023-12-01 ENCOUNTER — Ambulatory Visit: Payer: 59 | Admitting: Urology

## 2023-12-03 ENCOUNTER — Ambulatory Visit
Admission: RE | Admit: 2023-12-03 | Discharge: 2023-12-03 | Disposition: A | Discharge status: Home / Self Care | Payer: 59 | Source: Ambulatory Visit | Attending: Hematology | Admitting: Hematology

## 2023-12-03 DIAGNOSIS — Z1231 Encounter for screening mammogram for malignant neoplasm of breast: Secondary | ICD-10-CM

## 2023-12-23 ENCOUNTER — Ambulatory Visit
Admission: RE | Admit: 2023-12-23 | Discharge: 2023-12-23 | Disposition: A | Discharge status: Home / Self Care | Payer: BC Managed Care – PPO | Source: Ambulatory Visit | Attending: Obstetrics and Gynecology | Admitting: Obstetrics and Gynecology

## 2023-12-23 ENCOUNTER — Encounter: Payer: Self-pay | Admitting: Hematology

## 2023-12-23 DIAGNOSIS — N951 Menopausal and female climacteric states: Secondary | ICD-10-CM

## 2023-12-24 DIAGNOSIS — N281 Cyst of kidney, acquired: Secondary | ICD-10-CM | POA: Insufficient documentation

## 2023-12-24 DIAGNOSIS — N200 Calculus of kidney: Secondary | ICD-10-CM | POA: Insufficient documentation

## 2023-12-24 NOTE — Progress Notes (Signed)
 Name: [name] DOB: 01/08/1961 MRN: [id number]  History of Present Illness: [name] is a 63 y.o. female who presents today as a new patient at [name].   She reports chief complaint of microscopic hematuria. > 10/03/2023: CT chest/abdomen/pelvis w/ contrast showed a punctate nonobstructive right interpolar renal stone and a benign right upper pole renal cyst. No hydronephrosis. Bladder unremarkable.  > 11/06/2023: - Seen by PCP for bladder pressure and left flank pain x1 week.  - UA: trace blood, otherwise unremarkable. - No urine microscopy. - Negative urine culture.  > 11/10/2023: - UA: moderate leukocytes only. Per Dr. Gerda Diss "I looked at the urine under the microscope did not see any blood cells". - Negative urine culture.  > 11/12/2023: CT abdomen/pelvis w/ contrast showed "Stable right renal cyst for which no further follow-up is required. Small nonobstructive right renal calculus. No hydronephrosis or renal obstruction is noted. Urinary bladder is unremarkable."  Today: Denies prior history of kidney stones. She denies any urinary symptoms today. She denies acute flank pain or abdominal pain; reports some mild left lower back pain. She denies prior history of gross hematuria.  She denies history of pelvic radiation for her ovarian cancer. She denies history of smoking. She denies taking anticoagulants.  Medications: Current Outpatient Medications  Medication Sig Dispense Refill   BIOTIN PO Take 4 tablets by mouth daily.     Cholecalciferol (VITAMIN D3 PO) Take 1 tablet by mouth daily.     citalopram (CELEXA) 20 MG tablet Take 1 tablet (20 mg total) by mouth daily. 90 tablet 2   ibuprofen (ADVIL) 800 MG tablet Take 1 tablet (800 mg total) by mouth every 8 (eight) hours as needed for moderate pain. For AFTER surgery only 30 tablet 0   loratadine (CLARITIN) 10 MG tablet Take 10 mg by mouth daily.     MIEBO 1.338 GM/ML SOLN Apply 1 drop to eye 4  (four) times daily.     rosuvastatin (CRESTOR) 5 MG tablet Take one tab po qd for cholesterol 90 tablet 1   pantoprazole (PROTONIX) 40 MG tablet Take 1 tablet (40 mg total) by mouth daily. (Patient not taking: Reported on 12/29/2023) 90 tablet 1   No current facility-administered medications for this visit.    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   Fatty liver    Gall stone    GERD (gastroesophageal reflux disease)    High blood pressure    History of stroke 2005   at or after childbirth   Hyperlipidemia    Normal spontaneous vaginal delivery    2   Ovarian cancer (HCC)    Pneumonia    Pre-eclampsia    Past Surgical History:  Procedure Laterality Date   BREAST BIOPSY Left 07/15/2017   2 masses   BUNIONECTOMY  08/2021   COLONOSCOPY N/A 06/09/2014   Procedure: COLONOSCOPY;  Surgeon: Malissa Hippo, MD;  Location: [facility];  Service: Endoscopy;  Laterality: N/A;  830-moved to 945 Ann to notify pt   COLONOSCOPY  03/28/2022   HYSTEROTOMY     LAPAROSCOPIC TOTAL HYSTERECTOMY  07/18/2010   RSO   NECK SURGERY  11/2012   c6 c7   ROBOTIC ASSISTED SALPINGO OOPHERECTOMY Left 03/12/2022   Procedure: XI ROBOTIC ASSISTED LEFT SALPINGO OOPHORECTOMY WITH STAGING;  Surgeon: Carver Fila, MD;  Location: [facility];  Service: Gynecology;  Laterality: Left;   Family History  Problem Relation Age of Onset   Heart disease Mother    Cancer Father    Stroke Father    Hypertension Father    Prostate cancer Father 48   Breast cancer Sister 34       metastatic   Diabetes Brother    Prostate cancer Brother        dx after age 46; x2 brothers   Breast cancer Cousin        mat female cousin; dx 84s    Colon cancer Neg Hx    Ovarian cancer Neg Hx    Endometrial cancer Neg Hx    Pancreatic cancer Neg Hx    Stomach cancer Neg Hx    Esophageal cancer Neg Hx    Rectal cancer Neg Hx    Social History   Socioeconomic History   Marital status: Married    Spouse name: Not on file   Number of children: 2   Years of education: Not on file   Highest education level: Not on file  Occupational History   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   Occupation: retired  Tobacco Use   Smoking status: Never   Smokeless tobacco: Never  Vaping Use   Vaping status: Never Used  Substance and Sexual Activity   Alcohol use: Not Currently    Comment: 2-3 times/month   Drug use: No   Sexual activity: Yes    Birth control/protection: Surgical  Other Topics Concern   Not on file  Social History Narrative   Not on file   Social Drivers of Health   Financial Resource Strain: Not on file  Food Insecurity: Not on file  Transportation Needs: Not on file  Physical Activity: Not on file  Stress: Not on file  Social Connections: Not on file  Intimate Partner Violence: Not on file    SUBJECTIVE  Review of Systems Constitutional: Patient denies any unintentional weight loss or change in strength lntegumentary: Patient denies any rashes or pruritus Cardiovascular: Patient denies chest pain or syncope Respiratory: Patient denies shortness of breath Musculoskeletal: Patient denies muscle cramps or weakness Neurologic: Patient denies convulsions or seizures Allergic/Immunologic: Patient denies recent allergic reaction(s) Hematologic/Lymphatic: Patient denies bleeding tendencies Endocrine: Patient denies heat/cold intolerance  GU: As per HPI.  OBJECTIVE Vitals:   12/29/23 0832  BP: 115/71  Pulse: 87  Temp: 98.4 F (36.9 C)   There is no height or weight on file to calculate BMI.  Physical Examination Constitutional: No obvious distress; patient is non-toxic appearing  Cardiovascular:  No visible lower extremity edema.  Respiratory: The patient does not have audible wheezing/stridor; respirations do not appear labored  Gastrointestinal: Abdomen non-distended Musculoskeletal: Normal ROM of UEs  Skin: No obvious rashes/open sores  Neurologic: CN 2-12 grossly intact Psychiatric: Answered questions appropriately with normal affect  Hematologic/Lymphatic/Immunologic: No obvious bruises or sites of spontaneous bleeding  UA: trace blood, otherwise unremarkable Urine microscopy: negative (0-2 RBC/hpf)  PVR: 0 ml  ASSESSMENT Microscopic hematuria - Plan: BLADDER SCAN AMB NON-IMAGING, Urinalysis, Routine w reflex microscopic  Kidney stones - Plan: DG Abd 1 View  Renal cyst, right  We reviewed the difference between urinalysis and urine microscopy as it pertains to asymptomatic microscopic hematuria. She has had no  evidence of clinically significant microscopic hematuria based on prior labs and today's urine microscopy, therefore She was advised that no further workup is indicated per the AUA 2020 AM[facility] guideline.  We discussed the CT findings of a punctate nonobstructive right interpolar renal stone and a benign right upper pole renal cyst. She was advised that the cyst does not require surveillance and the nonobstructive tiny right intrarenal stone would not be causing pain.   Will plan for follow up in 6 months with [facility] for stone surveillance or sooner if needed. Pt verbalized understanding and agreement. All questions were answered.   PLAN Advised the following: 1. Return in about 6 months (around 06/30/2024) for [facility], UA, & f/u with Evette Georges NP.  Orders Placed This Encounter  Procedures   DG Abd 1 View    Standing Status:   Future    Expected Date:   06/30/2024    Expiration Date:   12/28/2024    Reason for Exam (SYMPTOM  OR DIAGNOSIS REQUIRED):   kidney stone    Is patient pregnant?:   No    Preferred imaging location?:   [facility]   Urinalysis, Routine  w reflex microscopic   BLADDER SCAN AMB NON-IMAGING    It has been explained that the patient is to follow regularly with their PCP in addition to all other providers involved in their care and to follow instructions provided by these respective offices. Patient advised to contact urology clinic if any urologic-pertaining questions, concerns, new symptoms or problems arise in the interim period.  There are no Patient Instructions on file for this visit.  Electronically signed by:  Donnita Falls, MSN, FNP-C, CUNP 12/29/2023 9:15 AM

## 2023-12-29 ENCOUNTER — Encounter: Payer: Self-pay | Admitting: Hematology

## 2023-12-29 ENCOUNTER — Ambulatory Visit: Payer: 59 | Admitting: Urology

## 2023-12-29 ENCOUNTER — Encounter: Payer: Self-pay | Admitting: Urology

## 2023-12-29 VITALS — BP 115/71 | HR 87 | Temp 98.4°F

## 2023-12-29 DIAGNOSIS — R3129 Other microscopic hematuria: Secondary | ICD-10-CM | POA: Diagnosis not present

## 2023-12-29 DIAGNOSIS — N200 Calculus of kidney: Secondary | ICD-10-CM | POA: Diagnosis not present

## 2023-12-29 DIAGNOSIS — N281 Cyst of kidney, acquired: Secondary | ICD-10-CM | POA: Diagnosis not present

## 2023-12-29 LAB — URINALYSIS, ROUTINE W REFLEX MICROSCOPIC
Bilirubin, UA: NEGATIVE
Glucose, UA: NEGATIVE
Ketones, UA: NEGATIVE
Leukocytes,UA: NEGATIVE
Nitrite, UA: NEGATIVE
Protein,UA: NEGATIVE
Specific Gravity, UA: 1.03 (ref 1.005–1.030)
Urobilinogen, Ur: 0.2 mg/dL (ref 0.2–1.0)
pH, UA: 6 (ref 5.0–7.5)

## 2023-12-29 LAB — MICROSCOPIC EXAMINATION: Bacteria, UA: NONE SEEN

## 2023-12-29 LAB — BLADDER SCAN AMB NON-IMAGING: Scan Result: 0

## 2024-02-05 ENCOUNTER — Encounter: Payer: Self-pay | Admitting: Hematology

## 2024-02-11 NOTE — Assessment & Plan Note (Signed)
 Stage IIB -presented with acute left abdominal pain. CT [facility] on 12/13/21 in ED showed a 6.6 cm hemorrhagic mass. Baseline CA 125 from 01/03/22 was WNL at 9.7. her pain resolved and no recurrent or other symptoms -S/p left salpingo-oophorectomy on 03/12/22 with Dr. Orvil Bland. 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 I spoke with pathologist Dr. Bernetta Brilliant, he agrees with the diagnosis but pointed out that 2% ovarian cancer is intestinal type and IHC will be similar to low GI primary.  -colonoscopy on 03/28/22 with Dr. Karene Oto showed no suspicious mass, only polyps.  -baseline CEA on 04/01/22 was WNL.  -PET scan on 04/10/22 showed: no convincing evidence of hypermetabolic disease; postsurgical hypermetabolism in left adnexa and abdominal wall. -enterography on 04/15/22 showed: no small bowel masses or nodularity or tumor along omentum or mesentery; no definite appendiceal tumor. -she completed 4 cycles adjuvant CAPEOX 04/30/22 - 07/02/22 and one additional cycle of Xeloda  alone.   -Surveillance CT scan from 10/03/2023 showed no evidence of recurrence, I personally reviewed the images with pt. -She is clinically doing well, lab reviewed, no concern for recurrence.  Will continue cancer surveillance.

## 2024-02-12 ENCOUNTER — Inpatient Hospital Stay (HOSPITAL_BASED_OUTPATIENT_CLINIC_OR_DEPARTMENT_OTHER): Payer: 59 | Admitting: Hematology

## 2024-02-12 ENCOUNTER — Other Ambulatory Visit: Payer: Self-pay

## 2024-02-12 ENCOUNTER — Inpatient Hospital Stay: Payer: 59 | Attending: Hematology

## 2024-02-12 VITALS — BP 138/76 | HR 79 | Temp 97.5°F | Resp 20 | Ht 66.0 in | Wt 214.7 lb

## 2024-02-12 DIAGNOSIS — N200 Calculus of kidney: Secondary | ICD-10-CM | POA: Insufficient documentation

## 2024-02-12 DIAGNOSIS — K648 Other hemorrhoids: Secondary | ICD-10-CM | POA: Insufficient documentation

## 2024-02-12 DIAGNOSIS — K5730 Diverticulosis of large intestine without perforation or abscess without bleeding: Secondary | ICD-10-CM | POA: Insufficient documentation

## 2024-02-12 DIAGNOSIS — C562 Malignant neoplasm of left ovary: Secondary | ICD-10-CM | POA: Diagnosis not present

## 2024-02-12 DIAGNOSIS — K219 Gastro-esophageal reflux disease without esophagitis: Secondary | ICD-10-CM | POA: Diagnosis not present

## 2024-02-12 DIAGNOSIS — Z860101 Personal history of adenomatous and serrated colon polyps: Secondary | ICD-10-CM | POA: Diagnosis not present

## 2024-02-12 DIAGNOSIS — Z8673 Personal history of transient ischemic attack (TIA), and cerebral infarction without residual deficits: Secondary | ICD-10-CM | POA: Insufficient documentation

## 2024-02-12 DIAGNOSIS — K760 Fatty (change of) liver, not elsewhere classified: Secondary | ICD-10-CM | POA: Diagnosis not present

## 2024-02-12 DIAGNOSIS — Z8543 Personal history of malignant neoplasm of ovary: Secondary | ICD-10-CM | POA: Insufficient documentation

## 2024-02-12 DIAGNOSIS — I10 Essential (primary) hypertension: Secondary | ICD-10-CM | POA: Diagnosis not present

## 2024-02-12 DIAGNOSIS — M7918 Myalgia, other site: Secondary | ICD-10-CM | POA: Insufficient documentation

## 2024-02-12 DIAGNOSIS — E785 Hyperlipidemia, unspecified: Secondary | ICD-10-CM | POA: Insufficient documentation

## 2024-02-12 DIAGNOSIS — Z90721 Acquired absence of ovaries, unilateral: Secondary | ICD-10-CM | POA: Insufficient documentation

## 2024-02-12 DIAGNOSIS — Z87442 Personal history of urinary calculi: Secondary | ICD-10-CM | POA: Insufficient documentation

## 2024-02-12 DIAGNOSIS — Z79899 Other long term (current) drug therapy: Secondary | ICD-10-CM | POA: Insufficient documentation

## 2024-02-12 LAB — CMP (CANCER CENTER ONLY)
ALT: 27 U/L (ref 0–44)
AST: 21 U/L (ref 15–41)
Albumin: 4.2 g/dL (ref 3.5–5.0)
Alkaline Phosphatase: 56 U/L (ref 38–126)
Anion gap: 5 (ref 5–15)
BUN: 14 mg/dL (ref 8–23)
CO2: 26 mmol/L (ref 22–32)
Calcium: 9.3 mg/dL (ref 8.9–10.3)
Chloride: 109 mmol/L (ref 98–111)
Creatinine: 0.71 mg/dL (ref 0.44–1.00)
GFR, Estimated: >60 mL/min (ref >60)
Glucose, Bld: 100 mg/dL — ABNORMAL HIGH (ref 70–99)
Potassium: 3.9 mmol/L (ref 3.5–5.1)
Sodium: 140 mmol/L (ref 135–145)
Total Bilirubin: 0.5 mg/dL (ref 0.0–1.2)
Total Protein: 6.9 g/dL (ref 6.5–8.1)

## 2024-02-12 LAB — CBC WITH DIFFERENTIAL (CANCER CENTER ONLY)
Abs Immature Granulocytes: 0.01 10*3/uL (ref 0.00–0.07)
Basophils Absolute: 0 10*3/uL (ref 0.0–0.1)
Basophils Relative: 0 %
Eosinophils Absolute: 0.2 10*3/uL (ref 0.0–0.5)
Eosinophils Relative: 4 %
HCT: 40.4 % (ref 36.0–46.0)
Hemoglobin: 14 g/dL (ref 12.0–15.0)
Immature Granulocytes: 0 %
Lymphocytes Relative: 38 %
Lymphs Abs: 2.1 10*3/uL (ref 0.7–4.0)
MCH: 28.7 pg (ref 26.0–34.0)
MCHC: 34.7 g/dL (ref 30.0–36.0)
MCV: 82.8 fL (ref 80.0–100.0)
Monocytes Absolute: 0.4 10*3/uL (ref 0.1–1.0)
Monocytes Relative: 8 %
Neutro Abs: 2.7 10*3/uL (ref 1.7–7.7)
Neutrophils Relative %: 50 %
Platelet Count: 282 10*3/uL (ref 150–400)
RBC: 4.88 MIL/uL (ref 3.87–5.11)
RDW: 13.2 % (ref 11.5–15.5)
WBC Count: 5.4 10*3/uL (ref 4.0–10.5)
nRBC: 0 % (ref 0.0–0.2)

## 2024-02-12 NOTE — Progress Notes (Signed)
 [facility]   Telephone:([phone]) [phone] Fax:(336) [phone]   Clinic Follow up Note   Patient Care Team: Bennet Brasil, MD as PCP - General (Family Medicine) Davia Erps, MD (Inactive) as Consulting Physician (Obstetrics and Gynecology) Yvonna Herder, MD as Consulting Physician (Neurosurgery) Ruby Corporal, MD (Inactive) as Consulting Physician (Gastroenterology) Sonja [place], MD as Consulting Physician (Oncology)  Date of Service:  02/12/2024  CHIEF COMPLAINT: f/u of ovarian cancer  CURRENT THERAPY:  Cancer surveillance  Oncology History   Adenocarcinoma (epithelial) of ovary, left (HCC) Stage IIB -presented with acute left abdominal pain. CT AP on 12/13/21 in ED showed a 6.6 cm hemorrhagic mass. Baseline CA 125 from 01/03/22 was WNL at 9.7. her pain resolved and no recurrent or other symptoms -S/p left salpingo-oophorectomy on 03/12/22 with Dr. Orvil Bland. 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 I spoke with pathologist Dr. Bernetta Brilliant, he agrees with the diagnosis but pointed out that 2% ovarian cancer is intestinal type and IHC will be similar to low GI primary.  -colonoscopy on 03/28/22 with Dr. Karene Oto showed no suspicious mass, only polyps.  -baseline CEA on 04/01/22 was WNL.  -PET scan on 04/10/22 showed: no convincing evidence of hypermetabolic disease; postsurgical hypermetabolism in left adnexa and abdominal wall. -enterography on 04/15/22 showed: no small bowel masses or nodularity or tumor along omentum or mesentery; no definite appendiceal tumor. -she completed 4 cycles adjuvant CAPEOX 04/30/22 - 07/02/22 and one additional cycle of Xeloda  alone.   -Surveillance CT scan from 10/03/2023 showed no evidence of recurrence, I personally reviewed the images with pt. -She is clinically doing well, lab reviewed, no concern for recurrence.  Will continue cancer surveillance.  Assessment &  Plan Ovarian cancer, post-surgery Ovarian cancer status post-surgery with no current evidence of disease. Previous PET scan and genetic testing were negative. She is concerned about recurrence and is considering additional monitoring options. Discussed the use of circulating tumor DNA testing (Signatera) for early detection of recurrence. The test involves sequencing the tumor and detecting tumor DNA in the blood. It may not be covered by insurance, but the cost may be reduced if not covered. The test can be done every 3-6 months, but there is no guideline for action if positive without scan evidence. Discussed the potential for increased anxiety with frequent monitoring and the possibility of false positives. More than half of patients elect to do this test. - Order CT scan one week before the next follow-up appointment in three months. - Discuss the option of circulating tumor DNA testing (Signatera) with her for early detection of recurrence.  After the detailed discussion, she declined. - Schedule lab work and CT scan on the same day to avoid multiple visits. - Release CT scan results to her before the follow-up appointment.  Kidney stones Right-sided kidney stones previously identified. Current left-sided back pain is likely musculoskeletal, as imaging showed no concerning findings related to kidney stones.  Musculoskeletal pain, left side Intermittent dull pain on the left side, likely musculoskeletal or skeletal in nature. Pain is not sharp and does not radiate. Discussed the possibility of postmenopausal joint changes contributing to the pain.   Plan - She is clinically doing well, exam was unremarkable.  I reviewed her previous CT scan from February 2025, which showed no evidence of recurrence. - We discussed ctDNA for cancer surveillance, she declined. - Follow-up in 3 months with lab and CT scan 1 week before  SUMMARY OF ONCOLOGIC HISTORY: Oncology History Overview Note    Cancer Staging  Adenocarcinoma (epithelial) of ovary, left Staging form: Ovary, Fallopian Tube, and Primary Peritoneal Carcinoma, AJCC 8th Edition - Clinical stage from 04/17/2022: FIGO Stage IIB (cT2b, cN0, cM0) - Signed by Sonja [place], MD on 04/22/2022 Stage prefix: Initial diagnosis Histologic grade (G): GX Histologic grading system: 4 grade system     Adenocarcinoma (epithelial) of ovary, left (HCC)  12/13/2021 Imaging   EXAM: CT ABDOMEN AND PELVIS WITH CONTRAST  IMPRESSION: 1. 6.6 cm hyperdense left adnexal/ovarian mass, likely a hemorrhagic cyst/mass. Recommend follow-up pelvic ultrasound. 2. 1.5 cm hyperenhancing lesion in the right hepatic lobe. Most likely differentials include hemangioma, FNH, adenoma. Consider nonemergent follow-up MRI abdomen with contrast.   01/03/2022 Tumor Marker   CA 125: 9.7 (WNL)   01/14/2022 Imaging   EXAM: MRI ABDOMEN WITHOUT AND WITH CONTRAST  IMPRESSION: Enhancing 13 mm segment VI hepatic lesion measuring 13 mm demonstrates nonspecific imaging characteristics but which is favored to reflect a benign etiology such as focal nodular hyperplasia or a hepatic adenoma. Recommend follow-up MRI in 6 months with and without EOVIST contrast for more definitive characterization and to assess stability.   03/12/2022 Pathology Results   FINAL MICROSCOPIC DIAGNOSIS:   A.   FALLOPIAN TUBE AND OVARY, LEFT, SALPINGO OOPHORECTOMY:  -    Adenocarcinoma, most consistent with metastatic colorectal  adenocarcinoma, see Comment.  -    Fallopian tube, negative for malignancy/intraepithelial carcinoma (STIC).   B.   PELVIC SIDEWALL, RIGHT, BIOPSY:  -    Benign, fibroelastic nodule.  -    Negative for malignancy.   C.   CUL DE SAC, POSTERIOR, BIOPSY:  -    Negative for malignancy.   D.   CUL DE SAC, ANTERIOR, BIOPSY:  -    Negative for malignancy.   E.   PELVIC SIDEWALL, LEFT, BIOPSY:  -    Negative for malignancy.   F.   PARACOLIC GUTTER, LEFT, BIOPSY:  -     Negative for malignancy.   G.   PARACOLIC GUTTER, RIGHT, BIOPSY:  -    Negative for malignancy.   H.   OMENTUM:  -    Negative for malignancy.   I.   OVARY REMNANT, LEFT, EXCISION:  -    Adenocarcinoma, most consistent with metastatic colorectal  adenocarcinoma.   COMMENT:  The tumor was interrogated with immunohistochemical (IHC) stains.  The tumor cells are diffusely and strongly positive for CDX-2, CK20 and mCEA and the tumor cells are CK7 negative.   This immunoprofile strongly favors metastatic colorectal adenocarcinoma.   The tumor cells are PAX8 negative to focally equivocal, a finding which is non-contributory. Primary ovarian mucinous adenocarcinoma can be CK20, CDX-2 and mCEA positive; although the staining pattern is typically focal and not diffuse, as in this case.  Also, supportive of a metastasis is CK7 negativity (primary ovarian mucinous carcinoma is typically CK7 positive).   There are rare cells with intracytoplasmic mucin  (mucicarmine stain).   The best IHC stain to differentiate between metastatic colorectal  adenocarcinoma and primary ovarian mucinous adenocarcinoma is SATB2. This marker has been ordered and will be performed at [facility].  The result will be reported in an addendum.   Furthermore, the tumor cells are negative for serous markers (p16, p53 (wildtype), WT-1) and negative for endometrioid markers (ER and vimentin).  The Ki-67 mitotic index is high.  The IHC stains and mucicarmine stain have satisfactory controls.    ADDENDUM:  The  tumor was interrogated with SATB2 immunohistochemical (IHC) stain, performed at [facility].  The tumor has diffuse strong nuclear reactivity.  This result, in combination with the prior IHC results, is essentially diagnostic of a lower GI (colorectal) or appendiceal primary tumor.  The control is satisfactory.    03/28/2022 Procedure   Colonoscopy, Dr. Karene Oto  Impression: - Two 2 to 3 mm polyps in the sigmoid colon,  removed with a cold snare. Resected and retrieved. - The remainder of the colon was normal, including retoflexed views of the right colon. - The examined portion of the ileum was normal. - Non-bleeding internal hemorrhoids.   04/01/2022 Initial Diagnosis   Adenocarcinoma (epithelial) of ovary, left (HCC)   04/17/2022 Cancer Staging   Staging form: Ovary, Fallopian Tube, and Primary Peritoneal Carcinoma, AJCC 8th Edition - Clinical stage from 04/17/2022: FIGO Stage IIB (cT2b, cN0, cM0) - Signed by Sonja Valier, MD on 04/22/2022 Stage prefix: Initial diagnosis Histologic grade (G): GX Histologic grading system: 4 grade system   04/29/2022 - 07/02/2022 Chemotherapy   Patient is on Treatment Plan : COLORECTAL Xelox ([place]ox)(130/850) q21d     04/30/2022 - 05/20/2022 Chemotherapy   Patient is on Treatment Plan : COLORECTAL Xelox (Capeox) q21d     07/09/2022 Genetic Testing   Negative hereditary cancer genetic testing: no pathogenic variants detected in [facility] CancerNextExpanded +RNAinsight Panel.  Report date is 07/09/2022.   The CancerNext-Expanded gene panel offered by [facility] and includes sequencing, rearrangement, and RNA analysis for the following 77 genes: AIP, ALK, APC, ATM, AXIN2, BAP1, BARD1, BLM, BMPR1A, BRCA1, BRCA2, BRIP1, CDC73, CDH1, CDK4, CDKN1B, CDKN2A, CHEK2, CTNNA1, DICER1, FANCC, FH, FLCN, GALNT12, KIF1B, LZTR1, MAX, MEN1, MET, MLH1, MSH2, MSH3, MSH6, MUTYH, NBN, NF1, NF2, NTHL1, PALB2, PHOX2B, PMS2, POT1, PRKAR1A, PTCH1, PTEN, RAD51C, RAD51D, RB1, RECQL, RET, SDHA, SDHAF2, SDHB, SDHC, SDHD, SMAD4, SMARCA4, SMARCB1, SMARCE1, STK11, SUFU, TMEM127, TP53, TSC1, TSC2, VHL and XRCC2 (sequencing and deletion/duplication); EGFR, EGLN1, HOXB13, KIT, MITF, PDGFRA, POLD1, and POLE (sequencing only); EPCAM and GREM1 (deletion/duplication only).   HRD testing is pending.    10/28/2022 Imaging    IMPRESSION: 1. Prior left salpingo-oophorectomy without evidence of local recurrence or  metastatic disease within the chest, abdomen, or pelvis. 2. Mild symmetric distal esophageal wall thickening, correlate for symptoms of esophagitis. 3. Diffuse hepatic steatosis. 4. Cholelithiasis without findings of acute cholecystitis. 5. Nonobstructive 2 mm right renal calculus. 6. Sigmoid colonic diverticulosis without findings of acute diverticulitis.   04/28/2023 Imaging    IMPRESSION: 1. Status post hysterectomy without evidence of recurrent or metastatic disease within the chest, abdomen or pelvis. 2. Cholelithiasis without findings of acute cholecystitis.        Discussed the use of AI scribe software for clinical note transcription with the patient, who gave verbal consent to proceed.  History of Present Illness [name] is a 63 year old female with ovarian cancer who presents for follow-up.  She has experienced no new symptoms in the past three months. Her appetite and energy levels remain good. She inquires about the timing for her next colonoscopy, with her last one around the time of her diagnosis last year.  Current medications include cholesterol medication, Protonix , Celexa , biotin, calcium , vitamin D , a generic allergy medication like Claritin, and a sinus medication. She is concerned about the combined intake of vitamin D  from supplements.  She experiences intermittent dull pain on her left side, which is not sharp. She was previously informed that the pain could be skeletal or muscular.  An episode of dizziness and lightheadedness occurred at a golf tournament, leading to an urgent care visit where she was diagnosed with dehydration. She did not lose consciousness and was treated with water .  Blood work shows normal blood counts, with kidney and liver function results pending. Her last scan in February and a previous scan due to abdominal pain showed no concerning findings. She underwent surgery in June 2023, and her last genetic testing was negative.      All other systems were reviewed with the patient and are negative.  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   BMI 35.0-35.9,adult    Cancer (HCC) 03/12/2022   colon adenoca on ovary   Fatty liver    Gall stone    GERD (gastroesophageal reflux disease)    High blood pressure    History of stroke 2005   at or after childbirth   Hyperlipidemia    Normal spontaneous vaginal delivery    2   Ovarian cancer (HCC)    Pneumonia    Pre-eclampsia     SURGICAL HISTORY: Past Surgical History:  Procedure Laterality Date   BREAST BIOPSY Left 07/15/2017   2 masses   BUNIONECTOMY  08/2021   COLONOSCOPY N/A 06/09/2014   Procedure: COLONOSCOPY;  Surgeon: Ruby Corporal, MD;  Location: [facility];  Service: Endoscopy;  Laterality: N/A;  830-moved to 945 Ann to notify pt   COLONOSCOPY  03/28/2022   HYSTEROTOMY     LAPAROSCOPIC TOTAL HYSTERECTOMY  07/18/2010   RSO   NECK SURGERY  11/2012   c6 c7   ROBOTIC ASSISTED SALPINGO OOPHERECTOMY Left 03/12/2022   Procedure: XI ROBOTIC ASSISTED LEFT SALPINGO OOPHORECTOMY WITH STAGING;  Surgeon: Suzi Essex, MD;  Location: [facility];  Service: Gynecology;  Laterality: Left;    I have reviewed the social history and family history with the patient and they are unchanged from previous note.  ALLERGIES:  is allergic to wound dressing adhesive.  MEDICATIONS:  Current Outpatient Medications  Medication Sig Dispense Refill   BIOTIN PO Take 4 tablets by mouth daily.     Calcium  Carbonate (CALCIUM  600 PO) Take by mouth.     Cholecalciferol (VITAMIN D3 PO) Take 1 tablet by mouth daily.     citalopram  (CELEXA ) 20 MG tablet Take 1 tablet (20 mg total) by mouth daily. 90 tablet 2   ibuprofen  (ADVIL ) 800 MG tablet Take 1 tablet (800 mg total) by mouth every 8 (eight) hours as needed for moderate pain. For AFTER surgery  only 30 tablet 0   loratadine (CLARITIN) 10 MG tablet Take 10 mg by mouth daily.     MIEBO 1.338 GM/ML SOLN Apply 1 drop to eye 4 (four) times daily.     pantoprazole  (PROTONIX ) 40 MG tablet Take 1 tablet (40 mg total) by mouth daily. 90 tablet 1   rosuvastatin  (CRESTOR ) 5 MG tablet Take one tab po qd for cholesterol 90 tablet 1   No current facility-administered medications for this visit.    PHYSICAL EXAMINATION: ECOG PERFORMANCE STATUS: 0 - Asymptomatic  Vitals:   02/12/24 0905  BP: 138/76  Pulse: 79  Resp: 20  Temp: (!) 97.5 F (36.4 C)  SpO2: 97%   Wt Readings from Last 3 Encounters:  02/12/24 214 lb 11.2 oz (97.4 kg)  11/06/23 208 lb (94.3 kg)  10/17/23 208  lb 14.4 oz (94.8 kg)     GENERAL:alert, no distress and comfortable SKIN: skin color, texture, turgor are normal, no rashes or significant lesions EYES: normal, Conjunctiva are pink and non-injected, sclera clear NECK: supple, thyroid normal size, non-tender, without nodularity LYMPH:  no palpable lymphadenopathy in the cervical, axillary  LUNGS: clear to auscultation and percussion with normal breathing effort HEART: regular rate & rhythm and no murmurs and no lower extremity edema ABDOMEN:abdomen soft, non-tender and normal bowel sounds Musculoskeletal:no cyanosis of digits and no clubbing  NEURO: alert & oriented x 3 with fluent speech, no focal motor/sensory deficits  Physical Exam    LABORATORY DATA:  I have reviewed the data as listed    Latest Ref Rng & Units 02/12/2024    8:46 AM 10/17/2023    8:48 AM 07/14/2023    9:00 AM  CBC  WBC 4.0 - 10.5 K/uL 5.4  4.8  4.9   Hemoglobin 12.0 - 15.0 g/dL 54.0  98.1  19.1   Hematocrit 36.0 - 46.0 % 40.4  41.7  42.8   Platelets 150 - 400 K/uL 282  271  302         Latest Ref Rng & Units 02/12/2024    8:46 AM 10/17/2023    8:48 AM 07/14/2023    9:00 AM  CMP  Glucose 70 - 99 mg/dL 478  295  99   BUN 8 - 23 mg/dL 14  18  21    Creatinine 0.44 - 1.00 mg/dL 6.21   3.08  6.57   Sodium 135 - 145 mmol/L 140  142  141   Potassium 3.5 - 5.1 mmol/L 3.9  4.3  4.0   Chloride 98 - 111 mmol/L 109  108  110   CO2 22 - 32 mmol/L 26  30  25    Calcium  8.9 - 10.3 mg/dL 9.3  9.4  9.5   Total Protein 6.5 - 8.1 g/dL 6.9  6.8  7.0   Total Bilirubin 0.0 - 1.2 mg/dL 0.5  0.5  0.5   Alkaline Phos 38 - 126 U/L 56  65  64   AST 15 - 41 U/L 21  20  18    ALT 0 - 44 U/L 27  33  21       RADIOGRAPHIC STUDIES: I have personally reviewed the radiological images as listed and agreed with the findings in the report. No results found.    Orders Placed This Encounter  Procedures   CT CHEST ABDOMEN PELVIS W CONTRAST    Standing Status:   Future    Expected Date:   05/05/2024    Expiration Date:   02/11/2025    If indicated for the ordered procedure, I authorize the administration of contrast media per Radiology protocol:   Yes    Does the patient have a contrast media/X-ray dye allergy?:   No    Preferred imaging location?:   [facility]    Release to patient:   Immediate    If indicated for the ordered procedure, I authorize the administration of oral contrast media per Radiology protocol:   Yes   All questions were answered. The patient knows to call the clinic with any problems, questions or concerns. No barriers to learning was detected. The total time spent in the appointment was 30 minutes, including review of chart and various tests results, discussions about plan of care and coordination of care plan     Sonja Arlee, MD 02/12/2024

## 2024-03-08 ENCOUNTER — Encounter: Payer: Self-pay | Admitting: Hematology

## 2024-03-30 ENCOUNTER — Encounter: Payer: Self-pay | Admitting: Family Medicine

## 2024-04-02 NOTE — Telephone Encounter (Signed)
 Nurses Please order a fungal culture for toenail clippings and give [name] a sterile cup to collect these with along with the lab requisition Once she is done some clippings she can put this within the cup and send it to [facility] for fungal culture  Should be noted that fungal culture once turned in typically takes 3-1/2 to 4 weeks for it to grow out anything and at that point then hopefully with the information can guide Jenise on what the next best step is  Thanks-Dr. Glendia please help her with this she would like to pick it up today

## 2024-04-07 ENCOUNTER — Other Ambulatory Visit: Payer: Self-pay

## 2024-04-07 DIAGNOSIS — L609 Nail disorder, unspecified: Secondary | ICD-10-CM

## 2024-04-07 MED ORDER — MUPIROCIN 2 % EX OINT
TOPICAL_OINTMENT | CUTANEOUS | 0 refills | Status: AC
Start: 2024-04-07 — End: ?

## 2024-04-07 NOTE — Telephone Encounter (Signed)
 Nurses Please connect with Karna I would recommend stopping Neosporin Stay away from tree oil I would also recommend use Bactroban  ointment, 15 mg tube, apply thin amount twice a day for the next 3 days then allow this area to air out  [place] help [place] it thank you  Certainly if any ongoing troubles or problems we can help get her in with podiatry-but if her toe improves and clears up she can do the toenail clippings as stated in the previous message to do a fungal culture Please assist her with this as well  Thanks-Dr. Glendia

## 2024-04-13 ENCOUNTER — Other Ambulatory Visit: Payer: Self-pay

## 2024-04-13 DIAGNOSIS — L609 Nail disorder, unspecified: Secondary | ICD-10-CM

## 2024-04-20 ENCOUNTER — Encounter: Payer: Self-pay | Admitting: Family Medicine

## 2024-04-23 ENCOUNTER — Other Ambulatory Visit: Payer: Self-pay | Admitting: Gastroenterology

## 2024-04-28 ENCOUNTER — Encounter: Payer: Self-pay | Admitting: Hematology

## 2024-05-03 LAB — FUNGUS CULTURE W SMEAR

## 2024-05-03 LAB — SPECIMEN STATUS REPORT

## 2024-05-04 ENCOUNTER — Ambulatory Visit: Payer: Self-pay | Admitting: Family Medicine

## 2024-05-05 ENCOUNTER — Inpatient Hospital Stay: Attending: Hematology

## 2024-05-05 ENCOUNTER — Ambulatory Visit (HOSPITAL_COMMUNITY)
Admission: RE | Admit: 2024-05-05 | Discharge: 2024-05-05 | Disposition: A | Discharge status: Home / Self Care | Source: Ambulatory Visit | Attending: Hematology | Admitting: Hematology

## 2024-05-05 DIAGNOSIS — C562 Malignant neoplasm of left ovary: Secondary | ICD-10-CM | POA: Insufficient documentation

## 2024-05-05 DIAGNOSIS — Z8543 Personal history of malignant neoplasm of ovary: Secondary | ICD-10-CM | POA: Insufficient documentation

## 2024-05-05 LAB — CBC WITH DIFFERENTIAL (CANCER CENTER ONLY)
Abs Immature Granulocytes: 0.01 K/uL (ref 0.00–0.07)
Basophils Absolute: 0 K/uL (ref 0.0–0.1)
Basophils Relative: 1 %
Eosinophils Absolute: 0.4 K/uL (ref 0.0–0.5)
Eosinophils Relative: 7 %
HCT: 43.8 % (ref 36.0–46.0)
Hemoglobin: 14.6 g/dL (ref 12.0–15.0)
Immature Granulocytes: 0 %
Lymphocytes Relative: 35 %
Lymphs Abs: 1.9 K/uL (ref 0.7–4.0)
MCH: 28.6 pg (ref 26.0–34.0)
MCHC: 33.3 g/dL (ref 30.0–36.0)
MCV: 85.7 fL (ref 80.0–100.0)
Monocytes Absolute: 0.4 K/uL (ref 0.1–1.0)
Monocytes Relative: 7 %
Neutro Abs: 2.8 K/uL (ref 1.7–7.7)
Neutrophils Relative %: 50 %
Platelet Count: 277 K/uL (ref 150–400)
RBC: 5.11 MIL/uL (ref 3.87–5.11)
RDW: 13 % (ref 11.5–15.5)
WBC Count: 5.5 K/uL (ref 4.0–10.5)
nRBC: 0 % (ref 0.0–0.2)

## 2024-05-05 LAB — CMP (CANCER CENTER ONLY)
ALT: 27 U/L (ref 0–44)
AST: 21 U/L (ref 15–41)
Albumin: 4.1 g/dL (ref 3.5–5.0)
Alkaline Phosphatase: 56 U/L (ref 38–126)
Anion gap: 3 — ABNORMAL LOW (ref 5–15)
BUN: 14 mg/dL (ref 8–23)
CO2: 31 mmol/L (ref 22–32)
Calcium: 9.2 mg/dL (ref 8.9–10.3)
Chloride: 108 mmol/L (ref 98–111)
Creatinine: 0.9 mg/dL (ref 0.44–1.00)
GFR, Estimated: >60 mL/min (ref >60)
Glucose, Bld: 96 mg/dL (ref 70–99)
Potassium: 3.8 mmol/L (ref 3.5–5.1)
Sodium: 142 mmol/L (ref 135–145)
Total Bilirubin: 0.6 mg/dL (ref 0.0–1.2)
Total Protein: 7.1 g/dL (ref 6.5–8.1)

## 2024-05-05 MED ORDER — IOHEXOL 300 MG/ML  SOLN
100.0000 mL | Freq: Once | INTRAMUSCULAR | Status: AC | PRN
  Administered 2024-05-05: 100 mL via INTRAVENOUS

## 2024-05-05 MED ORDER — IOHEXOL 9 MG/ML PO SOLN
1000.0000 mL | ORAL | Status: AC
  Administered 2024-05-05: 1000 mL via ORAL

## 2024-05-05 MED ORDER — IOHEXOL 9 MG/ML PO SOLN
ORAL | Status: AC
  Filled 2024-05-05: qty 1000

## 2024-05-08 ENCOUNTER — Encounter: Payer: Self-pay | Admitting: Hematology

## 2024-05-10 ENCOUNTER — Telehealth: Payer: Self-pay | Admitting: Family Medicine

## 2024-05-10 NOTE — Telephone Encounter (Signed)
 The patient reached out with a text message to my cell phone regarding her concern regarding her CAT scan results.  We had a discussion this evening.  I have encouraged her to discuss in further details with Dr.Feng when she follows up on Wednesday.  Her blood work overall looks good calcium  alkaline phosphatase platelets and hemoglobin all look good.  But more than likely her oncologist will recommend either a bone scan or a PET scan.  I told the patient that we would follow along electronically and if she had any questions or concerns she can feel free to reach out but it would be best for her to have a detailed discussion with her oncologist on Wednesday

## 2024-05-11 NOTE — Assessment & Plan Note (Signed)
 Stage IIB -presented with acute left abdominal pain. CT AP on 12/13/21 in ED showed a 6.6 cm hemorrhagic mass. Baseline CA 125 from 01/03/22 was WNL at 9.7. her pain resolved and no recurrent or other symptoms -S/p left salpingo-oophorectomy on 03/12/22 with Dr. Viktoria. 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 I spoke with pathologist Dr. Rebbecca, he agrees with the diagnosis but pointed out that 2% ovarian cancer is intestinal type and IHC will be similar to low GI primary.  -colonoscopy on 03/28/22 with Dr. San showed no suspicious mass, only polyps.  -baseline CEA on 04/01/22 was WNL.  -PET scan on 04/10/22 showed: no convincing evidence of hypermetabolic disease; postsurgical hypermetabolism in left adnexa and abdominal wall. -enterography on 04/15/22 showed: no small bowel masses or nodularity or tumor along omentum or mesentery; no definite appendiceal tumor. -she completed 4 cycles adjuvant CAPEOX 04/30/22 - 07/02/22 and one additional cycle of Xeloda  alone.   -Surveillance CT scan from 05/05/2024 showed no evidence of recurrence except a new 6 mm sclerotic rimmed focus over the L4 vertebral body just right of midline which is indeterminate. Will get MRI and signatera for further evaluation

## 2024-05-12 ENCOUNTER — Inpatient Hospital Stay: Attending: Hematology | Admitting: Hematology

## 2024-05-12 ENCOUNTER — Encounter: Payer: Self-pay | Admitting: Family Medicine

## 2024-05-12 ENCOUNTER — Other Ambulatory Visit: Payer: Self-pay

## 2024-05-12 VITALS — BP 120/80 | HR 85 | Temp 98.1°F | Resp 15 | Ht 66.0 in | Wt 212.3 lb

## 2024-05-12 DIAGNOSIS — I10 Essential (primary) hypertension: Secondary | ICD-10-CM | POA: Insufficient documentation

## 2024-05-12 DIAGNOSIS — N200 Calculus of kidney: Secondary | ICD-10-CM | POA: Insufficient documentation

## 2024-05-12 DIAGNOSIS — Z8543 Personal history of malignant neoplasm of ovary: Secondary | ICD-10-CM | POA: Insufficient documentation

## 2024-05-12 DIAGNOSIS — C562 Malignant neoplasm of left ovary: Secondary | ICD-10-CM

## 2024-05-12 DIAGNOSIS — K760 Fatty (change of) liver, not elsewhere classified: Secondary | ICD-10-CM | POA: Diagnosis not present

## 2024-05-12 DIAGNOSIS — Z8673 Personal history of transient ischemic attack (TIA), and cerebral infarction without residual deficits: Secondary | ICD-10-CM | POA: Insufficient documentation

## 2024-05-12 DIAGNOSIS — K219 Gastro-esophageal reflux disease without esophagitis: Secondary | ICD-10-CM | POA: Insufficient documentation

## 2024-05-12 DIAGNOSIS — M791 Myalgia, unspecified site: Secondary | ICD-10-CM | POA: Insufficient documentation

## 2024-05-12 DIAGNOSIS — Z79899 Other long term (current) drug therapy: Secondary | ICD-10-CM | POA: Diagnosis not present

## 2024-05-12 DIAGNOSIS — K8020 Calculus of gallbladder without cholecystitis without obstruction: Secondary | ICD-10-CM | POA: Diagnosis not present

## 2024-05-12 DIAGNOSIS — Z90710 Acquired absence of both cervix and uterus: Secondary | ICD-10-CM | POA: Diagnosis not present

## 2024-05-12 DIAGNOSIS — Z90721 Acquired absence of ovaries, unilateral: Secondary | ICD-10-CM | POA: Insufficient documentation

## 2024-05-12 DIAGNOSIS — E785 Hyperlipidemia, unspecified: Secondary | ICD-10-CM | POA: Diagnosis not present

## 2024-05-12 NOTE — Progress Notes (Signed)
 [facility]   Telephone:(336) [phone] Fax:(336) [phone]   Clinic Follow up Note   Patient Care Team: Alphonsa Glendia LABOR, MD as PCP - General (Family Medicine) Winfred Curlee DEL, MD (Inactive) as Consulting Physician (Obstetrics and Gynecology) Alix Charleston, MD as Consulting Physician (Neurosurgery) Golda Claudis PENNER, MD (Inactive) as Consulting Physician (Gastroenterology) Lanny Callander, MD as Consulting Physician (Oncology)  Date of Service:  05/12/2024  CHIEF COMPLAINT: f/u of ovarian adenocarcinoma  CURRENT THERAPY:  Cancer surveillance  Oncology History   Adenocarcinoma (epithelial) of ovary, left (HCC) Stage IIB -presented with acute left abdominal pain. CT AP on 12/13/21 in ED showed a 6.6 cm hemorrhagic mass. Baseline CA 125 from 01/03/22 was WNL at 9.7. her pain resolved and no recurrent or other symptoms -S/p left salpingo-oophorectomy on 03/12/22 with Dr. Viktoria. 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 I spoke with pathologist Dr. Rebbecca, he agrees with the diagnosis but pointed out that 2% ovarian cancer is intestinal type and IHC will be similar to low GI primary.  -colonoscopy on 03/28/22 with Dr. San showed no suspicious mass, only polyps.  -baseline CEA on 04/01/22 was WNL.  -PET scan on 04/10/22 showed: no convincing evidence of hypermetabolic disease; postsurgical hypermetabolism in left adnexa and abdominal wall. -enterography on 04/15/22 showed: no small bowel masses or nodularity or tumor along omentum or mesentery; no definite appendiceal tumor. -she completed 4 cycles adjuvant CAPEOX 04/30/22 - 07/02/22 and one additional cycle of Xeloda  alone.   -Surveillance CT scan from 05/05/2024 showed no evidence of recurrence except a new 6 mm sclerotic rimmed focus over the L4 vertebral body just right of midline which is indeterminate. Will get MRI and for further evaluation   Assessment &  Plan Ovarian adenocarcinoma, status post treatment, surveillance No high concern for recurrence based on recent CT scan. Blood tests, including blood counts and kidney and liver function, are normal. No current symptoms suggestive of recurrence. Decision made not to pursue circulating tumor DNA testing due to anxiety concerns.  Abnormal bone lesion at L4, under evaluation for malignancy Abnormal signal in the bone at L4 noted on CT scan. Differential diagnosis includes benign causes, as more than half of such findings are benign. No current back pain or symptoms suggestive of malignancy. Discussed that bone is not a common site for metastasis in this type of cancer, but not impossible. She had a fall last August, which may be relevant to the finding. - Order MRI of lumbar spine with IV contrast for further evaluation of L4 lesion.  Plan - Surveillance CT scan images reviewed, and discussed with patient - Will obtain lumbar MRI with and without contrast to evaluate the L4 lesion - Will call her after the MRI   SUMMARY OF ONCOLOGIC HISTORY: Oncology History Overview Note   Cancer Staging  Adenocarcinoma (epithelial) of ovary, left Staging form: Ovary, Fallopian Tube, and Primary Peritoneal Carcinoma, AJCC 8th Edition - Clinical stage from 04/17/2022: FIGO Stage IIB (cT2b, cN0, cM0) - Signed by Lanny Callander, MD on 04/22/2022 Stage prefix: Initial diagnosis Histologic grade (G): GX Histologic grading system: 4 grade system     Adenocarcinoma (epithelial) of ovary, left (HCC)  12/13/2021 Imaging   EXAM: CT ABDOMEN AND PELVIS WITH CONTRAST  IMPRESSION: 1. 6.6 cm hyperdense left adnexal/ovarian mass, likely a hemorrhagic cyst/mass. Recommend follow-up pelvic ultrasound. 2. 1.5 cm hyperenhancing lesion in the right hepatic lobe. Most likely differentials include hemangioma, FNH, adenoma. Consider nonemergent  follow-up MRI abdomen with contrast.   01/03/2022 Tumor Marker   CA 125: 9.7 (WNL)    01/14/2022 Imaging   EXAM: MRI ABDOMEN WITHOUT AND WITH CONTRAST  IMPRESSION: Enhancing 13 mm segment VI hepatic lesion measuring 13 mm demonstrates nonspecific imaging characteristics but which is favored to reflect a benign etiology such as focal nodular hyperplasia or a hepatic adenoma. Recommend follow-up MRI in 6 months with and without EOVIST contrast for more definitive characterization and to assess stability.   03/12/2022 Pathology Results   FINAL MICROSCOPIC DIAGNOSIS:   A.   FALLOPIAN TUBE AND OVARY, LEFT, SALPINGO OOPHORECTOMY:  -    Adenocarcinoma, most consistent with metastatic colorectal  adenocarcinoma, see Comment.  -    Fallopian tube, negative for malignancy/intraepithelial carcinoma (STIC).   B.   PELVIC SIDEWALL, RIGHT, BIOPSY:  -    Benign, fibroelastic nodule.  -    Negative for malignancy.   C.   CUL DE SAC, POSTERIOR, BIOPSY:  -    Negative for malignancy.   D.   CUL DE SAC, ANTERIOR, BIOPSY:  -    Negative for malignancy.   E.   PELVIC SIDEWALL, LEFT, BIOPSY:  -    Negative for malignancy.   F.   PARACOLIC GUTTER, LEFT, BIOPSY:  -    Negative for malignancy.   G.   PARACOLIC GUTTER, RIGHT, BIOPSY:  -    Negative for malignancy.   H.   OMENTUM:  -    Negative for malignancy.   I.   OVARY REMNANT, LEFT, EXCISION:  -    Adenocarcinoma, most consistent with metastatic colorectal  adenocarcinoma.   COMMENT:  The tumor was interrogated with immunohistochemical (IHC) stains.  The tumor cells are diffusely and strongly positive for CDX-2, CK20 and mCEA and the tumor cells are CK7 negative.   This immunoprofile strongly favors metastatic colorectal adenocarcinoma.   The tumor cells are PAX8 negative to focally equivocal, a finding which is non-contributory. Primary ovarian mucinous adenocarcinoma can be CK20, CDX-2 and mCEA positive; although the staining pattern is typically focal and not diffuse, as in this case.  Also, supportive of a metastasis is  CK7 negativity (primary ovarian mucinous carcinoma is typically CK7 positive).   There are rare cells with intracytoplasmic mucin  (mucicarmine stain).   The best IHC stain to differentiate between metastatic colorectal  adenocarcinoma and primary ovarian mucinous adenocarcinoma is SATB2. This marker has been ordered and will be performed at [facility].  The result will be reported in an addendum.   Furthermore, the tumor cells are negative for serous markers (p16, p53 (wildtype), WT-1) and negative for endometrioid markers (ER and vimentin).  The Ki-67 mitotic index is high.  The IHC stains and mucicarmine stain have satisfactory controls.    ADDENDUM:  The tumor was interrogated with SATB2 immunohistochemical (IHC) stain, performed at [facility].  The tumor has diffuse strong nuclear reactivity.  This result, in combination with the prior IHC results, is essentially diagnostic of a lower GI (colorectal) or appendiceal primary tumor.  The control is satisfactory.    03/28/2022 Procedure   Colonoscopy, Dr. San  Impression: - Two 2 to 3 mm polyps in the sigmoid colon, removed with a cold snare. Resected and retrieved. - The remainder of the colon was normal, including retoflexed views of the right colon. - The examined portion of the ileum was normal. - Non-bleeding internal hemorrhoids.   04/01/2022 Initial Diagnosis   Adenocarcinoma (epithelial) of ovary, left (HCC)   04/17/2022  Cancer Staging   Staging form: Ovary, Fallopian Tube, and Primary Peritoneal Carcinoma, AJCC 8th Edition - Clinical stage from 04/17/2022: FIGO Stage IIB (cT2b, cN0, cM0) - Signed by Lanny Callander, MD on 04/22/2022 Stage prefix: Initial diagnosis Histologic grade (G): GX Histologic grading system: 4 grade system   04/29/2022 - 07/02/2022 Chemotherapy   Patient is on Treatment Plan : COLORECTAL Xelox (Capeox)(130/850) q21d     04/30/2022 - 05/20/2022 Chemotherapy   Patient is on Treatment Plan : COLORECTAL  Xelox (Capeox) q21d     07/09/2022 Genetic Testing   Negative hereditary cancer genetic testing: no pathogenic variants detected in [facility] CancerNextExpanded +RNAinsight Panel.  Report date is 07/09/2022.   The CancerNext-Expanded gene panel offered by [facility] and includes sequencing, rearrangement, and RNA analysis for the following 77 genes: AIP, ALK, APC, ATM, AXIN2, BAP1, BARD1, BLM, BMPR1A, BRCA1, BRCA2, BRIP1, CDC73, CDH1, CDK4, CDKN1B, CDKN2A, CHEK2, CTNNA1, DICER1, FANCC, FH, FLCN, GALNT12, KIF1B, LZTR1, MAX, MEN1, MET, MLH1, MSH2, MSH3, MSH6, MUTYH, NBN, NF1, NF2, NTHL1, PALB2, PHOX2B, PMS2, POT1, PRKAR1A, PTCH1, PTEN, RAD51C, RAD51D, RB1, RECQL, RET, SDHA, SDHAF2, SDHB, SDHC, SDHD, SMAD4, SMARCA4, SMARCB1, SMARCE1, STK11, SUFU, TMEM127, TP53, TSC1, TSC2, VHL and XRCC2 (sequencing and deletion/duplication); EGFR, EGLN1, HOXB13, KIT, MITF, PDGFRA, POLD1, and POLE (sequencing only); EPCAM and GREM1 (deletion/duplication only).   HRD testing is pending.    10/28/2022 Imaging    IMPRESSION: 1. Prior left salpingo-oophorectomy without evidence of local recurrence or metastatic disease within the chest, abdomen, or pelvis. 2. Mild symmetric distal esophageal wall thickening, correlate for symptoms of esophagitis. 3. Diffuse hepatic steatosis. 4. Cholelithiasis without findings of acute cholecystitis. 5. Nonobstructive 2 mm right renal calculus. 6. Sigmoid colonic diverticulosis without findings of acute diverticulitis.   04/28/2023 Imaging    IMPRESSION: 1. Status post hysterectomy without evidence of recurrent or metastatic disease within the chest, abdomen or pelvis. 2. Cholelithiasis without findings of acute cholecystitis.        Discussed the use of AI scribe software for clinical note transcription with the patient, who gave verbal consent to proceed.  History of Present Illness [name] is a 63 year old female with ovarian adenocarcinoma who presents for  follow-up. She is accompanied by Oneil, her family member.  She has been cancer-free for almost two and a half years since her diagnosis in March. She experiences anxiety about potential recurrence, with concern over minor aches. A recent CT scan shows an abnormal signal at L4 in the bone. She experienced back pain upon waking, attributed to muscle pain from sleeping awkwardly.  Blood tests, including blood counts and kidney and liver function tests, are normal. A low anion gap was noted but is not concerning. She has not undergone circulating tumor DNA testing due to anxiety.  She recalls a fall in August of the previous year, resulting in two cracked ribs, but no significant back injury. Her mother had scoliosis and stenosis. No stomach issues, back pain, or claustrophobia. Occasional back pain upon waking, attributed to muscle pain.     All other systems were reviewed with the patient and are negative.  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   BMI 35.0-35.9,adult    Cancer (HCC) 03/12/2022   colon adenoca on ovary   Fatty liver    Gall stone    GERD (gastroesophageal reflux disease)  High blood pressure    History of stroke 2005   at or after childbirth   Hyperlipidemia    Normal spontaneous vaginal delivery    2   Ovarian cancer (HCC)    Pneumonia    Pre-eclampsia     SURGICAL HISTORY: Past Surgical History:  Procedure Laterality Date   BREAST BIOPSY Left 07/15/2017   2 masses   BUNIONECTOMY  08/2021   COLONOSCOPY N/A 06/09/2014   Procedure: COLONOSCOPY;  Surgeon: Claudis RAYMOND Rivet, MD;  Location: [facility];  Service: Endoscopy;  Laterality: N/A;  830-moved to 945 Ann to notify pt   COLONOSCOPY  03/28/2022   HYSTEROTOMY     LAPAROSCOPIC TOTAL HYSTERECTOMY  07/18/2010   RSO   NECK SURGERY  11/2012   c6 c7   ROBOTIC ASSISTED SALPINGO  OOPHERECTOMY Left 03/12/2022   Procedure: XI ROBOTIC ASSISTED LEFT SALPINGO OOPHORECTOMY WITH STAGING;  Surgeon: Viktoria Comer SAUNDERS, MD;  Location: [facility];  Service: Gynecology;  Laterality: Left;    I have reviewed the social history and family history with the patient and they are unchanged from previous note.  ALLERGIES:  is allergic to wound dressing adhesive.  MEDICATIONS:  Current Outpatient Medications  Medication Sig Dispense Refill   BIOTIN PO Take 4 tablets by mouth daily.     Calcium  Carbonate (CALCIUM  600 PO) Take by mouth.     Cholecalciferol (VITAMIN D3 PO) Take 1 tablet by mouth daily.     citalopram  (CELEXA ) 20 MG tablet Take 1 tablet (20 mg total) by mouth daily. 90 tablet 2   ibuprofen  (ADVIL ) 800 MG tablet Take 1 tablet (800 mg total) by mouth every 8 (eight) hours as needed for moderate pain. For AFTER surgery only 30 tablet 0   loratadine (CLARITIN) 10 MG tablet Take 10 mg by mouth daily.     MIEBO 1.338 GM/ML SOLN Apply 1 drop to eye 4 (four) times daily.     mupirocin  ointment (BACTROBAN ) 2 % apply thin amount twice a day to toe area for 3 days 15 g 0   pantoprazole  (PROTONIX ) 40 MG tablet Take 1 tablet (40 mg total) by mouth daily. Patient needs follow up appointment for future refills. Please call [phone] to schedule an appointment. 90 tablet 0   rosuvastatin  (CRESTOR ) 5 MG tablet Take one tab po qd for cholesterol 90 tablet 1   No current facility-administered medications for this visit.    PHYSICAL EXAMINATION: ECOG PERFORMANCE STATUS: 0 - Asymptomatic  Vitals:   05/12/24 1001  BP: 120/80  Pulse: 85  Resp: 15  Temp: 98.1 F (36.7 C)  SpO2: 96%   Wt Readings from Last 3 Encounters:  05/12/24 212 lb 4.8 oz (96.3 kg)  02/12/24 214 lb 11.2 oz (97.4 kg)  11/06/23 208 lb (94.3 kg)     GENERAL:alert, no distress and comfortable SKIN: skin color, texture, turgor are normal, no rashes or significant lesions EYES: normal, Conjunctiva are pink and  non-injected, sclera clear Musculoskeletal:no cyanosis of digits and no clubbing  NEURO: alert & oriented x 3 with fluent speech, no focal motor/sensory deficits  Physical Exam    LABORATORY DATA:  I have reviewed the data as listed    Latest Ref Rng & Units 05/05/2024    8:53 AM 02/12/2024    8:46 AM 10/17/2023    8:48 AM  CBC  WBC 4.0 - 10.5 K/uL 5.5  5.4  4.8   Hemoglobin 12.0 - 15.0 g/dL 85.3  85.9  13.9  Hematocrit 36.0 - 46.0 % 43.8  40.4  41.7   Platelets 150 - 400 K/uL 277  282  271         Latest Ref Rng & Units 05/05/2024    8:53 AM 02/12/2024    8:46 AM 10/17/2023    8:48 AM  CMP  Glucose 70 - 99 mg/dL 96  899  896   BUN 8 - 23 mg/dL 14  14  18    Creatinine 0.44 - 1.00 mg/dL 9.09  9.28  9.22   Sodium 135 - 145 mmol/L 142  140  142   Potassium 3.5 - 5.1 mmol/L 3.8  3.9  4.3   Chloride 98 - 111 mmol/L 108  109  108   CO2 22 - 32 mmol/L 31  26  30    Calcium  8.9 - 10.3 mg/dL 9.2  9.3  9.4   Total Protein 6.5 - 8.1 g/dL 7.1  6.9  6.8   Total Bilirubin 0.0 - 1.2 mg/dL 0.6  0.5  0.5   Alkaline Phos 38 - 126 U/L 56  56  65   AST 15 - 41 U/L 21  21  20    ALT 0 - 44 U/L 27  27  33       RADIOGRAPHIC STUDIES: I have personally reviewed the radiological images as listed and agreed with the findings in the report. No results found.    Orders Placed This Encounter  Procedures   MR Lumbar Spine W Wo Contrast    Standing Status:   Future    Expected Date:   05/19/2024    Expiration Date:   05/12/2025    If indicated for the ordered procedure, I authorize the administration of contrast media per Radiology protocol:   Yes    What is the patient's sedation requirement?:   No Sedation    Does the patient have a pacemaker or implanted devices?:   No    Use SRS Protocol?:   No    Preferred imaging location?:   [facility] (table limit - 500lbs)   All questions were answered. The patient knows to call the clinic with any problems, questions or concerns. No barriers  to learning was detected. The total time spent in the appointment was 30 minutes, including review of chart and various tests results, discussions about plan of care and coordination of care plan     Onita Mattock, MD 05/12/2024

## 2024-05-19 ENCOUNTER — Ambulatory Visit (HOSPITAL_COMMUNITY)
Admission: RE | Admit: 2024-05-19 | Discharge: 2024-05-19 | Disposition: A | Discharge status: Home / Self Care | Source: Ambulatory Visit | Attending: Hematology | Admitting: Hematology

## 2024-05-19 DIAGNOSIS — C562 Malignant neoplasm of left ovary: Secondary | ICD-10-CM | POA: Insufficient documentation

## 2024-05-19 MED ORDER — GADOBUTROL 1 MMOL/ML IV SOLN
9.0000 mL | Freq: Once | INTRAVENOUS | Status: AC | PRN
  Administered 2024-05-19 (×2): 9 mL via INTRAVENOUS

## 2024-05-24 ENCOUNTER — Telehealth: Payer: Self-pay

## 2024-05-24 ENCOUNTER — Encounter: Payer: Self-pay | Admitting: Family Medicine

## 2024-05-24 ENCOUNTER — Inpatient Hospital Stay: Admitting: Hematology

## 2024-05-24 ENCOUNTER — Encounter: Payer: Self-pay | Admitting: Radiology

## 2024-05-24 DIAGNOSIS — Z8543 Personal history of malignant neoplasm of ovary: Secondary | ICD-10-CM | POA: Diagnosis not present

## 2024-05-24 DIAGNOSIS — C562 Malignant neoplasm of left ovary: Secondary | ICD-10-CM

## 2024-05-24 NOTE — Progress Notes (Signed)
 Karalee Wilkie POUR, MD  Daralene Ferol FALCON, RT Approved for CT guided core biopsy of small L4 vertebral body lesion.  See if pt willing to come to [facility], if so, schedule for me.  If travel is an issue, anyone can do at [facility] or [facility] as long as they have the bone lesion biopsy set and drill in stock.  HKM       Previous Messages    ----- Message ----- From: Daralene Ferol FALCON, RT Sent: 05/24/2024   2:00 PM EDT To: Channing LITTIE Eagles; Arseniy Toomey F Gabrial Domine, RT; Ir Proc* Subject: CT Biopsy                                       Procedure : CT Biopsy  Reason : biopsy of the L4 bone lesion to rule out malignancy Dx: Adenocarcinoma (epithelial) of ovary, left (HCC) [C56.2 (ICD-10-CM)]  History : CT ABDOMEN PELVIS W CONTRAST (Accession [id number]) (Order [id number]), MM 3D SCREENING MAMMOGRAM BILATERAL BREAST (Accession [id number]) (Order [id number]), DG BONE DENSITY (DXA) (Accession [id number]) (Order [id number]), CT CHEST ABDOMEN PELVIS W CONTRAST (Accession [id number]) (Order [id number]), MR Lumbar Spine W Wo Contrast (Accession [id number]) (Order [id number])  Provider: Lanny Callander, MD  Provider contact ;  [phone]  [email or web address]

## 2024-05-24 NOTE — Assessment & Plan Note (Signed)
 Stage IIB -presented with acute left abdominal pain. CT AP on 12/13/21 in ED showed a 6.6 cm hemorrhagic mass. Baseline CA 125 from 01/03/22 was WNL at 9.7. her pain resolved and no recurrent or other symptoms -S/p left salpingo-oophorectomy on 03/12/22 with Dr. Viktoria. 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 I spoke with pathologist Dr. Rebbecca, he agrees with the diagnosis but pointed out that 2% ovarian cancer is intestinal type and IHC will be similar to low GI primary.  -colonoscopy on 03/28/22 with Dr. San showed no suspicious mass, only polyps.  -baseline CEA on 04/01/22 was WNL.  -PET scan on 04/10/22 showed: no convincing evidence of hypermetabolic disease; postsurgical hypermetabolism in left adnexa and abdominal wall. -enterography on 04/15/22 showed: no small bowel masses or nodularity or tumor along omentum or mesentery; no definite appendiceal tumor. -she completed 4 cycles adjuvant CAPEOX 04/30/22 - 07/02/22 and one additional cycle of Xeloda  alone.   -Surveillance CT scan from 05/05/2024 showed no evidence of recurrence except a new 6 mm sclerotic rimmed focus over the L4 vertebral body just right of midline which is indeterminate.  - Lumbar MRI with and without contrast showed degenerative changes, and a suspicious lesion in the posterior L4 vertebral body, concerning for metastatic disease.

## 2024-05-24 NOTE — Telephone Encounter (Signed)
 Pt's husband called stating that he received a call from the pt stating that Dr. Lanny called earlier than the scheduled telephone visit today.  Pt updated spouse on what was discussed with Dr. Lanny but has some unclarity about some of the things discussed during the telephone visit.  Pt and spouse are requesting if Dr. Lanny could please give the pt's husband a call on his cellphone to go over what was discussed with the pt during the telephone visit.  Stated this nurse will make Dr. Lanny aware of their request.

## 2024-05-25 ENCOUNTER — Other Ambulatory Visit (HOSPITAL_COMMUNITY): Payer: Self-pay | Admitting: Student

## 2024-05-25 ENCOUNTER — Other Ambulatory Visit: Payer: Self-pay | Admitting: Family Medicine

## 2024-05-25 DIAGNOSIS — C562 Malignant neoplasm of left ovary: Secondary | ICD-10-CM

## 2024-05-25 NOTE — Progress Notes (Signed)
 Patient for CT guided core biopsy of small L4 vertebral body lesion on Wed 05/26/24, I called and spoke with the patient on the phone and gave pre-procedure instructions. Pt was made aware to be here at 9:30a, NPO after MN prior to procedure as well as driver post procedure/recovery/discharge. Pt stated understanding.  Called 05/24/24

## 2024-05-26 ENCOUNTER — Ambulatory Visit
Admission: RE | Admit: 2024-05-26 | Discharge: 2024-05-26 | Disposition: A | Discharge status: Home / Self Care | Source: Ambulatory Visit | Attending: Hematology | Admitting: Hematology

## 2024-05-26 ENCOUNTER — Other Ambulatory Visit: Payer: Self-pay

## 2024-05-26 DIAGNOSIS — E785 Hyperlipidemia, unspecified: Secondary | ICD-10-CM | POA: Diagnosis not present

## 2024-05-26 DIAGNOSIS — Z90721 Acquired absence of ovaries, unilateral: Secondary | ICD-10-CM | POA: Insufficient documentation

## 2024-05-26 DIAGNOSIS — M899 Disorder of bone, unspecified: Secondary | ICD-10-CM | POA: Insufficient documentation

## 2024-05-26 DIAGNOSIS — C562 Malignant neoplasm of left ovary: Secondary | ICD-10-CM | POA: Insufficient documentation

## 2024-05-26 DIAGNOSIS — I10 Essential (primary) hypertension: Secondary | ICD-10-CM | POA: Insufficient documentation

## 2024-05-26 LAB — CBC
HCT: 45.8 % (ref 36.0–46.0)
Hemoglobin: 14.9 g/dL (ref 12.0–15.0)
MCH: 28.4 pg (ref 26.0–34.0)
MCHC: 32.5 g/dL (ref 30.0–36.0)
MCV: 87.2 fL (ref 80.0–100.0)
Platelets: 310 K/uL (ref 150–400)
RBC: 5.25 MIL/uL — ABNORMAL HIGH (ref 3.87–5.11)
RDW: 13.2 % (ref 11.5–15.5)
WBC: 6 K/uL (ref 4.0–10.5)
nRBC: 0 % (ref 0.0–0.2)

## 2024-05-26 LAB — PROTIME-INR
INR: 0.9 (ref 0.8–1.2)
Prothrombin Time: 12.8 s (ref 11.4–15.2)

## 2024-05-26 MED ORDER — FENTANYL CITRATE (PF) 100 MCG/2ML IJ SOLN
INTRAMUSCULAR | Status: AC
  Filled 2024-05-26: qty 2

## 2024-05-26 MED ORDER — SODIUM CHLORIDE 0.9 % IV SOLN: INTRAVENOUS | Status: DC

## 2024-05-26 MED ORDER — LIDOCAINE HCL (PF) 1 % IJ SOLN
10.0000 mL | Freq: Once | INTRAMUSCULAR | Status: AC
  Administered 2024-05-26: 10 mL
  Filled 2024-05-26: qty 10

## 2024-05-26 MED ORDER — MIDAZOLAM HCL 5 MG/5ML IJ SOLN
INTRAMUSCULAR | Status: AC | PRN
Start: 2024-05-26 — End: 2024-05-26
  Administered 2024-05-26 (×2): 1 mg via INTRAVENOUS

## 2024-05-26 MED ORDER — MIDAZOLAM HCL 2 MG/2ML IJ SOLN
INTRAMUSCULAR | Status: AC
  Filled 2024-05-26: qty 2

## 2024-05-26 MED ORDER — FENTANYL CITRATE (PF) 100 MCG/2ML IJ SOLN
INTRAMUSCULAR | Status: AC | PRN
  Administered 2024-05-26 (×2): 50 ug via INTRAVENOUS

## 2024-05-26 NOTE — Procedures (Signed)
 Interventional Radiology Procedure Note  Procedure: CT guided core biopsy of L4 lesion  Complications: None  Estimated Blood Loss: None  Recommendations: - Bedrest x 1 hr - DC home   Signed,  Wilkie LOIS Lent, MD

## 2024-05-26 NOTE — H&P (Signed)
 Chief Complaint: L4 lesion  Referring Provider(s): Feng,Yan   Supervising Physician: Karalee Beat  Patient Status: [facility] - Out-pt  History of Present Illness: [name] is a 63 y.o. female with past medical history of HLD, HTN, and ovarian cancer (adenocarcinoma, L, s/p left salpingo-oophorectomy on 03/12/22 with Dr. Viktoria). Surveillance CT 7/30 was notable for L4 abnormality.  Further imaging with MRI on 8/13 revealed L4 lesion which Dr. Lanny has requested biopsy of with IR. She presents today for this.   Confirms NPO since MN and ride/supervision available for 24 hours.  Denies fever, chills, SOB, CP, sore throat, N/V, abd pain, blood in stool or urine, abnormal bruising, leg swelling, back pain.   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   Fatty liver    Gall stone    GERD (gastroesophageal reflux disease)    High blood pressure    History of stroke 2005   at or after childbirth   Hyperlipidemia    Normal spontaneous vaginal delivery    2   Ovarian cancer (HCC)    Pneumonia    Pre-eclampsia     Past Surgical History:  Procedure Laterality Date   BREAST BIOPSY Left 07/15/2017   2 masses   BUNIONECTOMY  08/2021   COLONOSCOPY N/A 06/09/2014   Procedure: COLONOSCOPY;  Surgeon: Claudis RAYMOND Rivet, MD;  Location: [facility];  Service: Endoscopy;  Laterality: N/A;  830-moved to 945 Ann to notify pt   COLONOSCOPY  03/28/2022   HYSTEROTOMY     LAPAROSCOPIC TOTAL HYSTERECTOMY  07/18/2010   RSO   NECK SURGERY  11/2012   c6 c7   ROBOTIC ASSISTED SALPINGO OOPHERECTOMY Left 03/12/2022   Procedure: XI ROBOTIC ASSISTED LEFT SALPINGO OOPHORECTOMY WITH STAGING;  Surgeon: Viktoria Comer SAUNDERS, MD;  Location:  [facility];  Service: Gynecology;  Laterality: Left;      Medications: Prior to Admission medications   Medication Sig Start Date End Date Taking? Authorizing Provider  BIOTIN PO Take 4 tablets by mouth daily.   Yes [provider]  Calcium  Carbonate (CALCIUM  600 PO) Take by mouth.   Yes [provider]  citalopram  (CELEXA ) 20 MG tablet Take 1 tablet (20 mg total) by mouth daily. 11/06/23  Yes Grooms, Courtney, PA-C  loratadine (CLARITIN) 10 MG tablet Take 10 mg by mouth daily.   Yes [provider]  pantoprazole  (PROTONIX ) 40 MG tablet Take 1 tablet (40 mg total) by mouth daily. Patient needs follow up appointment for future refills. Please call [phone] to schedule an appointment. 04/23/24  Yes Cirigliano, Vito V, DO  rosuvastatin  (CRESTOR ) 5 MG tablet TAKE ONE (1) TABLET BY MOUTH EVERY DAY FOR CHOLESTEROL 05/25/24  Yes Luking, Glendia LABOR, MD  Cholecalciferol (VITAMIN D3 PO) Take 1 tablet by mouth daily.    [provider]  ibuprofen  (ADVIL ) 800 MG tablet Take 1 tablet (800 mg total) by mouth every 8 (eight) hours as needed for moderate pain. For AFTER surgery only 03/11/22   Cross, Melissa D, NP  MIEBO 1.338 GM/ML SOLN Apply 1 drop to eye 4 (four) times daily. 04/09/23   [provider]  mupirocin  ointment (BACTROBAN ) 2 % apply thin amount  twice a day to toe area for 3 days 04/07/24   Alphonsa Glendia LABOR, MD  pantoprazole  (PROTONIX ) 20 MG tablet Take 1 tablet (20 mg total) by mouth daily. Patient taking differently: Take 40 mg by mouth daily. 01/29/23   Cirigliano, Sandor GAILS, DO     Family History  Problem Relation Age of Onset   Heart disease Mother    Cancer Father    Stroke Father    Hypertension Father    Prostate cancer Father 36   Breast cancer Sister 42       metastatic   Diabetes Brother    Prostate cancer Brother        dx after age 67; x2 brothers   Breast cancer Cousin        mat female cousin; dx 72s   Colon cancer Neg Hx    Ovarian cancer  Neg Hx    Endometrial cancer Neg Hx    Pancreatic cancer Neg Hx    Stomach cancer Neg Hx    Esophageal cancer Neg Hx    Rectal cancer Neg Hx     Social History   Socioeconomic History   Marital status: Married    Spouse name: Not on file   Number of children: 2   Years of education: Not on file   Highest education level: Not on file  Occupational History   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   Occupation: retired  Tobacco Use   Smoking status: Never   Smokeless tobacco: Never  Vaping Use   Vaping status: Never Used  Substance and Sexual Activity   Alcohol use: Not Currently    Comment: 2-3 times/month   Drug use: No   Sexual activity: Yes    Birth control/protection: Surgical  Other Topics Concern   Not on file  Social History Narrative   Not on file   Social Drivers of Health   Financial Resource Strain: Not on file  Food Insecurity: Not on file  Transportation Needs: Not on file  Physical Activity: Not on file  Stress: Not on file  Social Connections: Not on file     Review of Systems: A 12 point ROS discussed and pertinent positives are indicated in the HPI above.  All other systems are negative.  Vital Signs: BP (!) 150/90   Pulse 70   Temp 98.4 F (36.9 C) (Temporal)   Resp 13   Ht 5' 6 (1.676 m)   Wt 212 lb (96.2 kg)   SpO2 94%   BMI 34.22 kg/m     Physical Exam HENT:     Mouth/Throat:     Mouth: Mucous membranes are moist.     Pharynx: Oropharynx is clear.  Cardiovascular:     Rate and Rhythm: Normal rate and regular rhythm.     Pulses: Normal pulses.     Heart sounds: Normal heart sounds.  Pulmonary:     Effort: Pulmonary effort is normal.     Breath sounds: Normal breath sounds.  Abdominal:     Palpations: Abdomen is soft.     Tenderness: There is no abdominal tenderness.  Musculoskeletal:     Right lower leg: No edema.     Left lower leg: No edema.  Skin:    General: Skin is warm and dry.     Capillary Refill:  Capillary refill takes less than 2 seconds.     Comments: Nontender and no external rash or wound to lumbar region  Neurological:  Mental Status: She is alert and oriented to person, place, and time.  Psychiatric:        Mood and Affect: Mood normal.        Behavior: Behavior normal.        Thought Content: Thought content normal.        Judgment: Judgment normal.     Imaging: MR Lumbar Spine W Wo Contrast Result Date: 05/20/2024 MR LUMBAR SPINE WITHOUT THEN WITH IV CONTRAST COMPARISON: CT chest abdomen pelvis 05/05/2024 and CT abdomen and pelvis 11/12/2023 CLINICAL HISTORY: Suspicious lesion in the L4 vertebral body in patient with history of ovarian malignancy. TECHNIQUE: SAG T2, SAG T1, SAG STIR, AX T2, AX T1 with and without fat-sat IV contrast. FINDINGS: There is mild grade 1 anterior spondylolisthesis of L4 on 5 secondary to facet arthrosis. There is no vertebral body height loss or significant subluxation. There is a suspicious lesion in the posterior central L4 vertebral body measuring 9 mm in size. This is low signal on T1, increased signal on T2 and demonstrates enhancement following contrast administration. As this was not present on the CT from 11/12/2023 this is suspicious for metastatic disease. No other lesions are identified. Correlation with PET/CT suggested. The sacrum and SI joints are unremarkable so far as visualized. Conus and cauda equina are unremarkable. T12-L1: There is no focal disc protrusion, foraminal or spinal stenosis. L1-2: There is no focal disc protrusion, foraminal or spinal stenosis. L2-3: There is no focal disc protrusion, foraminal or spinal stenosis. Mild facet arthrosis is present. L3-4: Mild disc desiccation and facet arthrosis. No significant foraminal or spinal stenosis. L4-5: Mild grade 1 anterior spondylolisthesis of L4 on 5 secondary to moderate facet arthrosis. No significant foraminal or spinal stenosis is present. L5-S1: There is no focal disc protrusion,  foraminal or spinal stenosis. There is a benign cyst in the superior lateral right kidney. Otherwise, the retroperitoneal structures are unremarkable. No visualized adenopathy. IMPRESSION: Degenerative changes throughout the lumbar spine without significant foraminal or spinal stenosis. There is a suspicious lesion in the posterior L4 vertebral body in patient with history of malignancy. This is concerning for metastatic disease. Correlation with PET/CT suggested. No other lesions are definitively identified. Electronically signed by: Norleen Satchel MD 05/20/2024 01:12 PM EDT RP Workstation: [id number]   CT CHEST ABDOMEN PELVIS W CONTRAST Result Date: 05/10/2024 CLINICAL DATA:  Surveillance due to history of ovarian cancer. EXAM: CT CHEST, ABDOMEN, AND PELVIS WITH CONTRAST TECHNIQUE: Multidetector CT imaging of the chest, abdomen and pelvis was performed following the standard protocol during bolus administration of intravenous contrast. RADIATION DOSE REDUCTION: This exam was performed according to the departmental dose-optimization program which includes automated exposure control, adjustment of the mA and/or kV according to patient size and/or use of iterative reconstruction technique. CONTRAST:  OMNIPAQUE  IOHEXOL  300 MG/ML  SOLN COMPARISON:  CT abdomen/pelvis 11/12/2023 and CT chest, abdomen and pelvis 10/03/2023 FINDINGS: CT CHEST FINDINGS Cardiovascular: Heart is normal size. Thoracic aorta is normal in caliber. Pulmonary arterial system is unremarkable. Remaining vascular structures are normal. Mediastinum/Nodes: No evidence of mediastinal or hilar adenopathy. Remaining mediastinal structures are normal. Lungs/Pleura: Lungs are adequately inflated without acute airspace consolidation or effusion. No concerning pulmonary nodules or masses. Airways are normal. Musculoskeletal: No focal abnormality. Partially visualized anterior fusion hardware over the cervical spine. CT ABDOMEN PELVIS FINDINGS  Hepatobiliary: Single 9 mm gallstone unchanged. Mild diffuse low-attenuation of the liver without evidence of focal liver mass. Biliary tree is normal. Pancreas: Normal. Spleen: Normal. Adrenals/Urinary  Tract: Adrenal glands are normal. Kidneys are normal in size. Stable 1.2 cm cyst over the upper pole right kidney. Punctate nonobstructing stone over the mid pole right kidney. Ureters and bladder are normal. Stomach/Bowel: Stomach and small bowel are normal. Appendix is normal. Colon is normal. Vascular/Lymphatic: Abdominal aorta is normal in caliber. Remaining vascular structures are unremarkable. No adenopathy. Reproductive: Status post hysterectomy and bilateral oophorectomy. No adnexal masses. Other: No free fluid or focal inflammatory change. Musculoskeletal: New 6 mm sclerotic rimmed focus over the L4 vertebral body just right of midline which is indeterminate as new metastatic focus is possible. IMPRESSION: 1. No acute findings in the chest, abdomen or pelvis. No evidence of metastatic disease within the chest or abdomen. 2. New 6 mm sclerotic rimmed focus over the L4 vertebral body just right of midline which is indeterminate as a new metastatic focus is possible. Recommend attention on follow-up. 3. Cholelithiasis. 4. Punctate nonobstructing right renal stone. 5. Stable 1.2 cm right renal cyst. No follow-up imaging recommended. Electronically Signed   By: Toribio Agreste M.D.   On: 05/10/2024 12:06    Labs:  CBC: Recent Labs    07/14/23 0900 10/17/23 0848 02/12/24 0846 05/05/24 0853  WBC 4.9 4.8 5.4 5.5  HGB 14.2 13.9 14.0 14.6  HCT 42.8 41.7 40.4 43.8  PLT 302 271 282 277    COAGS: No results for input(s): INR, APTT in the last 8760 hours.  BMP: Recent Labs    07/14/23 0900 10/17/23 0848 02/12/24 0846 05/05/24 0853  NA 141 142 140 142  K 4.0 4.3 3.9 3.8  CL 110 108 109 108  CO2 25 30 26 31   GLUCOSE 99 103* 100* 96  BUN 21* 18 14 14   CALCIUM  9.5 9.4 9.3 9.2  CREATININE  0.83 0.77 0.71 0.90  GFRNONAA >60 >60 >60 >60    LIVER FUNCTION TESTS: Recent Labs    07/14/23 0900 10/17/23 0848 02/12/24 0846 05/05/24 0853  BILITOT 0.5 0.5 0.5 0.6  AST 18 20 21 21   ALT 21 33 27 27  ALKPHOS 64 65 56 56  PROT 7.0 6.8 6.9 7.1  ALBUMIN 4.2 4.1 4.2 4.1    TUMOR MARKERS: No results for input(s): AFPTM, CEA, CA199, CHROMGRNA in the last 8760 hours.  Assessment and Plan:  Request for  image guided L4 vertebral body lesion biopsy approved 8/20 with Dr. Karalee No contraindications for procedure identified in ROS, physical exam, or review of pre-sedation considerations. 8/20 CBC and INR in progress 8/13 MR imaging available and reviewed VSS, afebrile Patient does not take a blood thinner     Risks and benefits of L4 vertebral body biopsy was discussed with the patient and/or patient's family including, but not limited to bleeding, infection, damage to adjacent structures or low yield requiring additional tests.  All of the questions were answered and there is agreement to proceed.  Consent signed and in chart.   Thank you for allowing our service to participate in [name] 's care.    Electronically Signed: Laymon Coast, NP   05/26/2024, 9:42 AM     I spent a total of  15 Minutes   in face to face in clinical consultation, greater than 50% of which was counseling/coordinating care for L4 vertebral body lesion.    (A copy of this note was sent to the referring provider and the time of visit.)

## 2024-05-26 NOTE — Progress Notes (Signed)
 Patient clinically stable post CT bone lesion biopsy per DR McCUllough, tolerated well. Vitals stable pre and post procedure. Received Versed  2 mg along with Fentanyl  100 mcg IV for procedure. Report given to Carlyon Louder RN post procedure/specials/15

## 2024-05-28 ENCOUNTER — Other Ambulatory Visit: Payer: Self-pay

## 2024-05-28 DIAGNOSIS — C562 Malignant neoplasm of left ovary: Secondary | ICD-10-CM

## 2024-05-28 DIAGNOSIS — N6320 Unspecified lump in the left breast, unspecified quadrant: Secondary | ICD-10-CM

## 2024-05-28 DIAGNOSIS — R222 Localized swelling, mass and lump, trunk: Secondary | ICD-10-CM

## 2024-05-28 NOTE — Progress Notes (Unsigned)
 Jenna Cordella LABOR, MD  [facility] Given the location of this lesion, a repeat biopsy will not be performed.  The lesion in question is in a location barely reachable and no new access available.       Previous Messages    ----- Message ----- From: [facility] Sent: 05/28/2024   1:31 PM EDT To: [facility]; Taryn F Rigney, RT; Ir Proc* Subject: CT Biopsy                                      Procedure :CT Biopsy  Reason :L4 Bone Biopsy, Dx: Adenocarcinoma (epithelial) of ovary, left, Mass of left breast, unspecified quadrant ,Chest wall mass, left   History :CT BONE TROCAR/NEEDLE BIOPSY DEEP ,MR Lumbar Spine W Wo Contrast,CT CHEST ABDOMEN PELVIS W CONTRAST,DG BONE DENSITY (DXA),MM 3D SCREENING MAMMOGRAM BILATERAL BREAST  Provider:Feng, Onita, MD  Provider contact ; [phone]

## 2024-05-28 NOTE — Progress Notes (Signed)
 Verbal order w/readback from Dr. Lanny for L4 Bone Biopsy to be done ASAP.  Order placed in EPIC and Biopsy schedulers contacted to arrange for getting biopsy scheduled.

## 2024-05-31 LAB — SURGICAL PATHOLOGY

## 2024-06-04 ENCOUNTER — Telehealth: Payer: Self-pay | Admitting: Hematology

## 2024-06-04 ENCOUNTER — Ambulatory Visit: Payer: Self-pay | Admitting: Hematology

## 2024-06-04 NOTE — Telephone Encounter (Signed)
 Called  patient and she is ware of the appts

## 2024-06-08 ENCOUNTER — Inpatient Hospital Stay: Admitting: Hematology

## 2024-06-17 ENCOUNTER — Encounter (HOSPITAL_COMMUNITY): Payer: Self-pay

## 2024-06-29 ENCOUNTER — Ambulatory Visit: Admitting: Urology

## 2024-06-30 ENCOUNTER — Ambulatory Visit: Admitting: Urology

## 2024-07-17 ENCOUNTER — Other Ambulatory Visit: Payer: Self-pay | Admitting: Gastroenterology

## 2024-07-19 ENCOUNTER — Telehealth: Payer: Self-pay | Admitting: Gastroenterology

## 2024-07-20 ENCOUNTER — Encounter: Payer: Self-pay | Admitting: Hematology

## 2024-07-20 NOTE — Progress Notes (Signed)
 [facility]   [phone] Fax:([phone]) [phone]   Clinic Follow up Note   Patient Care Team: Alphonsa Glendia LABOR, MD as PCP - General (Family Medicine) Winfred Curlee DEL, MD (Inactive) as Consulting Physician (Obstetrics and Gynecology) Alix Charleston, MD as Consulting Physician (Neurosurgery) Golda Claudis PENNER, MD (Inactive) as Consulting Physician (Gastroenterology) Lanny Callander, MD as Consulting Physician (Oncology) 07/20/2024  I connected with [name] on 05/24/2024 at  4:00 PM EDT by telephone and verified that I am speaking with the correct person using two identifiers.   I discussed the limitations, risks, security and privacy concerns of performing an evaluation and management service by telephone and the availability of in person appointments. I also discussed with the patient that there may be a patient responsible charge related to this service. The patient expressed understanding and agreed to proceed.   Patient's location:  Home  Provider's location:  Office    CHIEF COMPLAINT: f/u scan    CURRENT THERAPY: cancer surveillance   Oncology history Adenocarcinoma (epithelial) of ovary, left (HCC) Stage IIB -presented with acute left abdominal pain. CT [facility] on 12/13/21 in ED showed a 6.6 cm hemorrhagic mass. Baseline CA 125 from 01/03/22 was WNL at 9.7. her pain resolved and no recurrent or other symptoms -S/p left salpingo-oophorectomy on 03/12/22 with Dr. Viktoria. 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 I spoke with pathologist Dr. Rebbecca, he agrees with the diagnosis but pointed out that 2% ovarian cancer is intestinal type and IHC will be similar to low GI primary.  -colonoscopy on 03/28/22 with Dr. San showed no suspicious mass, only polyps.  -baseline CEA on 04/01/22 was WNL.  -PET scan on 04/10/22 showed: no convincing evidence of hypermetabolic disease; postsurgical  hypermetabolism in left adnexa and abdominal wall. -enterography on 04/15/22 showed: no small bowel masses or nodularity or tumor along omentum or mesentery; no definite appendiceal tumor. -she completed 4 cycles adjuvant CAPEOX 04/30/22 - 07/02/22 and one additional cycle of Xeloda  alone.   -Surveillance CT scan from 05/05/2024 showed no evidence of recurrence except a new 6 mm sclerotic rimmed focus over the L4 vertebral body just right of midline which is indeterminate.  - Lumbar MRI with and without contrast showed degenerative changes, and a suspicious lesion in the posterior L4 vertebral body, concerning for metastatic disease.   Assessment & Plan Suspected malignant bone lesion of L4 vertebra I reviewed her recent MRI of lumbar spine, which reveals a lesion in the L4 vertebra with characteristics suggestive of malignancy. Differential diagnosis includes benign versus malignant etiology, with an estimated 50% chance of malignancy. Biopsy is necessary to determine the nature of the lesion. Vertebral body surgery is not feasible; radiation therapy and possibly chemotherapy are potential treatments if malignancy is confirmed. - Request L4 bone lesion biopsy - Coordinate with radiology department for biopsy scheduling - Instruct her to contact radiology if not contacted by Wednesday or Thursday - Provide radiology contact number: [phone] - Schedule follow-up appointment approximately three days post-biopsy to discuss results  Ovarian adenocarcinoma Ovarian adenocarcinoma with concern for potential metastasis to the L4 vertebra. If biopsy confirms malignancy, further imaging with PET scan will be considered to assess for additional metastatic sites. - If biopsy confirms malignancy, order PET scan to evaluate for additional metastatic sites - Discuss potential treatment options including radiation therapy and chemotherapy if malignancy is confirmed   Plan -MRI reviewed, will refer her to IR  for biopsy -  f/u after biopsy.     SUMMARY OF ONCOLOGIC HISTORY: Oncology History Overview Note   Cancer Staging  Adenocarcinoma (epithelial) of ovary, left Staging form: Ovary, Fallopian Tube, and Primary Peritoneal Carcinoma, AJCC 8th Edition - Clinical stage from 04/17/2022: FIGO Stage IIB (cT2b, cN0, cM0) - Signed by Lanny Callander, MD on 04/22/2022 Stage prefix: Initial diagnosis Histologic grade (G): GX Histologic grading system: 4 grade system     Adenocarcinoma (epithelial) of ovary, left (HCC)  12/13/2021 Imaging   EXAM: CT ABDOMEN AND PELVIS WITH CONTRAST  IMPRESSION: 1. 6.6 cm hyperdense left adnexal/ovarian mass, likely a hemorrhagic cyst/mass. Recommend follow-up pelvic ultrasound. 2. 1.5 cm hyperenhancing lesion in the right hepatic lobe. Most likely differentials include hemangioma, FNH, adenoma. Consider nonemergent follow-up MRI abdomen with contrast.   01/03/2022 Tumor Marker   CA 125: 9.7 (WNL)   01/14/2022 Imaging   EXAM: MRI ABDOMEN WITHOUT AND WITH CONTRAST  IMPRESSION: Enhancing 13 mm segment VI hepatic lesion measuring 13 mm demonstrates nonspecific imaging characteristics but which is favored to reflect a benign etiology such as focal nodular hyperplasia or a hepatic adenoma. Recommend follow-up MRI in 6 months with and without EOVIST contrast for more definitive characterization and to assess stability.   03/12/2022 Pathology Results   FINAL MICROSCOPIC DIAGNOSIS:   A.   FALLOPIAN TUBE AND OVARY, LEFT, SALPINGO OOPHORECTOMY:  -    Adenocarcinoma, most consistent with metastatic colorectal  adenocarcinoma, see Comment.  -    Fallopian tube, negative for malignancy/intraepithelial carcinoma (STIC).   B.   PELVIC SIDEWALL, RIGHT, BIOPSY:  -    Benign, fibroelastic nodule.  -    Negative for malignancy.   C.   CUL DE SAC, POSTERIOR, BIOPSY:  -    Negative for malignancy.   D.   CUL DE SAC, ANTERIOR, BIOPSY:  -    Negative for malignancy.   E.    PELVIC SIDEWALL, LEFT, BIOPSY:  -    Negative for malignancy.   F.   PARACOLIC GUTTER, LEFT, BIOPSY:  -    Negative for malignancy.   G.   PARACOLIC GUTTER, RIGHT, BIOPSY:  -    Negative for malignancy.   H.   OMENTUM:  -    Negative for malignancy.   I.   OVARY REMNANT, LEFT, EXCISION:  -    Adenocarcinoma, most consistent with metastatic colorectal  adenocarcinoma.   COMMENT:  The tumor was interrogated with immunohistochemical (IHC) stains.  The tumor cells are diffusely and strongly positive for CDX-2, CK20 and mCEA and the tumor cells are CK7 negative.   This immunoprofile strongly favors metastatic colorectal adenocarcinoma.   The tumor cells are PAX8 negative to focally equivocal, a finding which is non-contributory. Primary ovarian mucinous adenocarcinoma can be CK20, CDX-2 and mCEA positive; although the staining pattern is typically focal and not diffuse, as in this case.  Also, supportive of a metastasis is CK7 negativity (primary ovarian mucinous carcinoma is typically CK7 positive).   There are rare cells with intracytoplasmic mucin  (mucicarmine stain).   The best IHC stain to differentiate between metastatic colorectal  adenocarcinoma and primary ovarian mucinous adenocarcinoma is SATB2. This marker has been ordered and will be performed at [facility].  The result will be reported in an addendum.   Furthermore, the tumor cells are negative for serous markers (p16, p53 (wildtype), WT-1) and negative for endometrioid markers (ER and vimentin).  The Ki-67 mitotic index is high.  The IHC stains and mucicarmine stain have satisfactory controls.  ADDENDUM:  The tumor was interrogated with SATB2 immunohistochemical (IHC) stain, performed at [facility].  The tumor has diffuse strong nuclear reactivity.  This result, in combination with the prior IHC results, is essentially diagnostic of a lower GI (colorectal) or appendiceal primary tumor.  The control is satisfactory.     03/28/2022 Procedure   Colonoscopy, Dr. San  Impression: - Two 2 to 3 mm polyps in the sigmoid colon, removed with a cold snare. Resected and retrieved. - The remainder of the colon was normal, including retoflexed views of the right colon. - The examined portion of the ileum was normal. - Non-bleeding internal hemorrhoids.   04/01/2022 Initial Diagnosis   Adenocarcinoma (epithelial) of ovary, left (HCC)   04/17/2022 Cancer Staging   Staging form: Ovary, Fallopian Tube, and Primary Peritoneal Carcinoma, AJCC 8th Edition - Clinical stage from 04/17/2022: FIGO Stage IIB (cT2b, cN0, cM0) - Signed by Lanny Callander, MD on 04/22/2022 Stage prefix: Initial diagnosis Histologic grade (G): GX Histologic grading system: 4 grade system   04/29/2022 - 07/02/2022 Chemotherapy   Patient is on Treatment Plan : COLORECTAL Xelox ([place]apeox)(130/850) q21d     04/30/2022 - 05/20/2022 Chemotherapy   Patient is on Treatment Plan : COLORECTAL Xelox (Capeox) q21d     07/09/2022 Genetic Testing   Negative hereditary cancer genetic testing: no pathogenic variants detected in [facility] CancerNextExpanded +RNAinsight Panel.  Report date is 07/09/2022.   The CancerNext-Expanded gene panel offered by [facility] and includes sequencing, rearrangement, and RNA analysis for the following 77 genes: AIP, ALK, APC, ATM, AXIN2, BAP1, BARD1, BLM, BMPR1A, BRCA1, BRCA2, BRIP1, CDC73, CDH1, CDK4, CDKN1B, CDKN2A, CHEK2, CTNNA1, DICER1, FANCC, FH, FLCN, GALNT12, KIF1B, LZTR1, MAX, MEN1, MET, MLH1, MSH2, MSH3, MSH6, MUTYH, NBN, NF1, NF2, NTHL1, PALB2, PHOX2B, PMS2, POT1, PRKAR1A, PTCH1, PTEN, RAD51C, RAD51D, RB1, RECQL, RET, SDHA, SDHAF2, SDHB, SDHC, SDHD, SMAD4, SMARCA4, SMARCB1, SMARCE1, STK11, SUFU, TMEM127, TP53, TSC1, TSC2, VHL and XRCC2 (sequencing and deletion/duplication); EGFR, EGLN1, HOXB13, KIT, MITF, PDGFRA, POLD1, and POLE (sequencing only); EPCAM and GREM1 (deletion/duplication only).   HRD testing is pending.     10/28/2022 Imaging    IMPRESSION: 1. Prior left salpingo-oophorectomy without evidence of local recurrence or metastatic disease within the chest, abdomen, or pelvis. 2. Mild symmetric distal esophageal wall thickening, correlate for symptoms of esophagitis. 3. Diffuse hepatic steatosis. 4. Cholelithiasis without findings of acute cholecystitis. 5. Nonobstructive 2 mm right renal calculus. 6. Sigmoid colonic diverticulosis without findings of acute diverticulitis.   04/28/2023 Imaging    IMPRESSION: 1. Status post hysterectomy without evidence of recurrent or metastatic disease within the chest, abdomen or pelvis. 2. Cholelithiasis without findings of acute cholecystitis.       Discussed the use of AI scribe software for clinical note transcription with the patient, who gave verbal consent to proceed.  History of Present Illness [name] is a 63 year old female with ovarian adenocarcinoma who presents for follow-up on MRI findings.  The MRI report indicates a lesion in the L4 lumbar spine. She is aware of this finding. The report also notes arthritis in her back, but she does not experience specific symptoms related to the arthritis.     REVIEW OF SYSTEMS:   Constitutional: Denies fevers, chills or abnormal weight loss Eyes: Denies blurriness of vision Ears, nose, mouth, throat, and face: Denies mucositis or sore throat Respiratory: Denies cough, dyspnea or wheezes Cardiovascular: Denies palpitation, chest discomfort or lower extremity swelling Gastrointestinal:  Denies nausea, heartburn or change in bowel habits Skin:  Denies abnormal skin rashes Lymphatics: Denies new lymphadenopathy or easy bruising Neurological:Denies numbness, tingling or new weaknesses Behavioral/Psych: Mood is stable, no new changes  All other systems were reviewed with the patient and are negative.  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   BMI 35.0-35.9,adult    Cancer (HCC) 03/12/2022   colon adenoca on ovary   Fatty liver    Gall stone    GERD (gastroesophageal reflux disease)    High blood pressure    History of stroke 2005   at or after childbirth   Hyperlipidemia    Normal spontaneous vaginal delivery    2   Ovarian cancer (HCC)    Pneumonia    Pre-eclampsia     SURGICAL HISTORY: Past Surgical History:  Procedure Laterality Date   BREAST BIOPSY Left 07/15/2017   2 masses   BUNIONECTOMY  08/2021   COLONOSCOPY N/A 06/09/2014   Procedure: COLONOSCOPY;  Surgeon: Claudis RAYMOND Rivet, MD;  Location: [facility];  Service: Endoscopy;  Laterality: N/A;  830-moved to 945 Ann to notify pt   COLONOSCOPY  03/28/2022   HYSTEROTOMY     LAPAROSCOPIC TOTAL HYSTERECTOMY  07/18/2010   RSO   NECK SURGERY  11/2012   c6 c7   ROBOTIC ASSISTED SALPINGO OOPHERECTOMY Left 03/12/2022   Procedure: XI ROBOTIC ASSISTED LEFT SALPINGO OOPHORECTOMY WITH STAGING;  Surgeon: Viktoria Comer SAUNDERS, MD;  Location: [facility];  Service: Gynecology;  Laterality: Left;    I have reviewed the social history and family history with the patient and they are unchanged from previous note.  ALLERGIES:  is allergic to wound dressing adhesive.  MEDICATIONS:  Current Outpatient Medications  Medication Sig Dispense Refill   BIOTIN PO Take 4 tablets by mouth daily.     Calcium  Carbonate (CALCIUM  600 PO) Take by mouth.     Cholecalciferol (VITAMIN D3 PO) Take 1 tablet by mouth daily.     citalopram  (CELEXA ) 20 MG tablet Take 1 tablet (20 mg total) by mouth daily. 90 tablet 2   ibuprofen  (ADVIL ) 800 MG tablet Take 1 tablet (800 mg total) by mouth every 8 (eight) hours as needed for moderate pain. For AFTER surgery only 30 tablet 0   loratadine (CLARITIN) 10 MG tablet Take 10 mg by mouth daily.     MIEBO 1.338 GM/ML SOLN Apply 1 drop to eye 4 (four) times daily.     mupirocin   ointment (BACTROBAN ) 2 % apply thin amount twice a day to toe area for 3 days 15 g 0   pantoprazole  (PROTONIX ) 40 MG tablet Take 1 tablet (40 mg total) by mouth daily. Patient needs follow up appointment for future refills. Please call [phone] to schedule an appointment. 30 tablet 0   rosuvastatin  (CRESTOR ) 5 MG tablet TAKE ONE (1) TABLET BY MOUTH EVERY DAY FOR CHOLESTEROL 90 tablet 1   No current facility-administered medications for this visit.    PHYSICAL EXAMINATION: Not performed   LABORATORY DATA:  I have reviewed the data as listed    Latest Ref Rng & Units 05/26/2024    9:33 AM 05/05/2024    8:53 AM 02/12/2024    8:46 AM  CBC  WBC 4.0 - 10.5 K/uL 6.0  5.5  5.4   Hemoglobin 12.0 - 15.0 g/dL 85.0  85.3  85.9   Hematocrit 36.0 - 46.0 % 45.8  43.8  40.4   Platelets 150 - 400 K/uL 310  277  282         Latest Ref Rng & Units 05/05/2024    8:53 AM 02/12/2024    8:46 AM 10/17/2023    8:48 AM  CMP  Glucose 70 - 99 mg/dL 96  899  896   BUN 8 - 23 mg/dL 14  14  18    Creatinine 0.44 - 1.00 mg/dL 9.09  9.28  9.22   Sodium 135 - 145 mmol/L 142  140  142   Potassium 3.5 - 5.1 mmol/L 3.8  3.9  4.3   Chloride 98 - 111 mmol/L 108  109  108   CO2 22 - 32 mmol/L 31  26  30    Calcium  8.9 - 10.3 mg/dL 9.2  9.3  9.4   Total Protein 6.5 - 8.1 g/dL 7.1  6.9  6.8   Total Bilirubin 0.0 - 1.2 mg/dL 0.6  0.5  0.5   Alkaline Phos 38 - 126 U/L 56  56  65   AST 15 - 41 U/L 21  21  20    ALT 0 - 44 U/L 27  27  33       RADIOGRAPHIC STUDIES: I have personally reviewed the radiological images as listed and agreed with the findings in the report. No results found.     I discussed the assessment and treatment plan with the patient. The patient was provided an opportunity to ask questions and all were answered. The patient agreed with the plan and demonstrated an understanding of the instructions.   The patient was advised to call back or seek an in-person evaluation if the symptoms worsen or  if the condition fails to improve as anticipated.  I provided 25 minutes of non face-to-face telephone visit time during this encounter, including review of chart and various tests results, discussions about plan of care and coordination of care plan.    Onita Mattock, MD 05/24/2024

## 2024-07-22 ENCOUNTER — Encounter: Payer: Self-pay | Admitting: Family Medicine

## 2024-07-23 ENCOUNTER — Other Ambulatory Visit: Payer: Self-pay | Admitting: Family Medicine

## 2024-07-23 MED ORDER — PANTOPRAZOLE SODIUM 40 MG PO TBEC: 40.0000 mg | DELAYED_RELEASE_TABLET | Freq: Every day | ORAL | 1 refills | Status: AC

## 2024-07-26 ENCOUNTER — Other Ambulatory Visit: Payer: Self-pay | Admitting: Nurse Practitioner

## 2024-07-26 ENCOUNTER — Encounter: Payer: Self-pay | Admitting: Family Medicine

## 2024-07-26 DIAGNOSIS — R0683 Snoring: Secondary | ICD-10-CM

## 2024-07-26 DIAGNOSIS — R5383 Other fatigue: Secondary | ICD-10-CM

## 2024-07-27 ENCOUNTER — Encounter: Payer: Self-pay | Admitting: Hematology

## 2024-08-02 ENCOUNTER — Encounter: Payer: Self-pay | Admitting: Hematology

## 2024-08-23 ENCOUNTER — Other Ambulatory Visit: Payer: Self-pay | Admitting: Physician Assistant

## 2024-09-08 ENCOUNTER — Institutional Professional Consult (permissible substitution): Admitting: Neurology

## 2024-09-29 ENCOUNTER — Encounter: Payer: Self-pay | Admitting: Family Medicine

## 2024-09-29 ENCOUNTER — Other Ambulatory Visit: Payer: Self-pay | Admitting: Family Medicine

## 2024-09-29 MED ORDER — CEPHALEXIN 500 MG PO CAPS: 500.0000 mg | ORAL_CAPSULE | Freq: Three times a day (TID) | ORAL | 0 refills | Status: DC

## 2024-09-29 NOTE — Progress Notes (Signed)
 The patient reached out by phone She was having burning urinary frequency Lower abdominal pressure No hematuria Low-grade fever of 99 No nausea or vomiting Able to hydrate and eat Utilizing Azo I recommend cephalexin  500 mg 3 times daily for 7 days If not improving over the next 48 hours to let us  know so we would recheck her If dramatically worse go to ER Patient aware

## 2024-10-08 ENCOUNTER — Encounter: Payer: Self-pay | Admitting: Family Medicine

## 2024-10-12 ENCOUNTER — Ambulatory Visit: Admitting: Neurology

## 2024-10-12 ENCOUNTER — Encounter: Payer: Self-pay | Admitting: Hematology

## 2024-10-12 ENCOUNTER — Encounter: Payer: Self-pay | Admitting: Neurology

## 2024-10-12 VITALS — BP 142/82 | HR 73 | Ht 65.0 in | Wt 216.0 lb

## 2024-10-12 DIAGNOSIS — Z820 Family history of epilepsy and other diseases of the nervous system: Secondary | ICD-10-CM

## 2024-10-12 DIAGNOSIS — Z9189 Other specified personal risk factors, not elsewhere classified: Secondary | ICD-10-CM | POA: Diagnosis not present

## 2024-10-12 DIAGNOSIS — R0683 Snoring: Secondary | ICD-10-CM | POA: Diagnosis not present

## 2024-10-12 DIAGNOSIS — R5383 Other fatigue: Secondary | ICD-10-CM | POA: Diagnosis not present

## 2024-10-12 DIAGNOSIS — R635 Abnormal weight gain: Secondary | ICD-10-CM | POA: Diagnosis not present

## 2024-10-12 DIAGNOSIS — R0681 Apnea, not elsewhere classified: Secondary | ICD-10-CM

## 2024-10-12 DIAGNOSIS — E669 Obesity, unspecified: Secondary | ICD-10-CM

## 2024-10-12 NOTE — Patient Instructions (Signed)

## 2024-10-12 NOTE — Progress Notes (Signed)
 Subjective:    Patient ID: [name] is a 64 y.o. female.  HPI    True Mar, MD, PhD [facility] [address], [address] [address] [place], [place] [address]  Dear Dr. Alphonsa,  I saw your patient, [name], upon your kind request in my sleep clinic today for initial consultation of her sleep disorder, in particular, concern for underlying obstructive sleep apnea.  The patient is accompanied by her husband today.  As you know, [name] is a 64 year old female with an underlying medical history of reflux disease, hypertension, history of stroke, hyperlipidemia, ovarian cancer, history of preeclampsia, allergy, status post hysterectomy, status post neck surgery, status post left salpingo oophorectomy, and obesity, who reports snoring and some daytime tiredness as well as witnessed apneas per family's feedback.   Her Epworth sleepiness score is 3 out of 24, fatigue severity score is 9 out of 63.  Her husband reports that he has witnessed apneic pauses while she is asleep for the past several years.  She has gained weight over time, in the past 9 years she gained about 30 pounds.  She denies recurrent morning headaches or nightly nocturia.  Her brother has sleep apnea and uses a PAP machine.  She has been retired since 2019.  She lives with her husband and 36 year old daughter, they also have an older daughter who lives with her family.  Bedtime is generally between 1 and 2 AM and rise time around 8 AM.  She drinks quite a bit of caffeine in the form of diet soda, about 4 cans/day.  She does not drink any alcohol currently, stopped about 2 years ago.  She is a non-smoker.  They have no pets in the household.  They do have a TV in the bedroom but do not typically turn it on or leave and on overnight.  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   Fatty liver    Gall stone    GERD (gastroesophageal reflux disease)    High blood pressure    History of stroke 2005   at or after childbirth   Hyperlipidemia    Normal spontaneous vaginal delivery    2   Ovarian cancer (HCC)    Pneumonia    Pre-eclampsia     Her Past Surgical History Is Significant For: Past Surgical History:  Procedure Laterality Date   BREAST BIOPSY Left 07/15/2017   2 masses   BUNIONECTOMY  08/2021   COLONOSCOPY N/A 06/09/2014   Procedure: COLONOSCOPY;  Surgeon: Claudis RAYMOND Rivet, MD;  Location: [facility];  Service: Endoscopy;  Laterality: N/A;  830-moved to 945 Ann to notify pt   COLONOSCOPY  03/28/2022   HYSTEROTOMY     LAPAROSCOPIC TOTAL HYSTERECTOMY  07/18/2010   RSO   NECK SURGERY  11/2012   c6 c7   ROBOTIC ASSISTED SALPINGO OOPHERECTOMY Left 03/12/2022   Procedure: XI ROBOTIC ASSISTED LEFT SALPINGO OOPHORECTOMY WITH STAGING;  Surgeon: Viktoria Comer SAUNDERS, MD;  Location: [facility];  Service: Gynecology;  Laterality: Left;    Her Family History Is Significant For: Family History  Problem Relation Age of Onset   Heart disease Mother    Cancer Father    Stroke Father  Hypertension Father    Prostate cancer Father 74   Breast cancer Sister 17       metastatic   Diabetes Brother    Prostate cancer Brother        dx after age 66; x2 brothers   Sleep apnea Brother    Breast cancer Cousin        mat female cousin; dx 71s   Colon cancer Neg Hx    Ovarian cancer Neg Hx    Endometrial cancer Neg Hx    Pancreatic cancer Neg Hx    Stomach cancer Neg Hx    Esophageal cancer Neg Hx    Rectal cancer Neg Hx     Her Social History Is Significant For: Social History   Socioeconomic History   Marital status: Married    Spouse name: Not on file   Number of children: 2   Years of education: Not on file   Highest education level: Not on file   Occupational History   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   Occupation: retired  Tobacco Use   Smoking status: Never   Smokeless tobacco: Never  Vaping Use   Vaping status: Never Used  Substance and Sexual Activity   Alcohol use: Not Currently    Comment: 2-3 times/month   Drug use: No   Sexual activity: Yes    Birth control/protection: Surgical  Other Topics Concern   Not on file  Social History Narrative   Not on file   Social Drivers of Health   Tobacco Use: Low Risk (10/12/2024)   Patient History    Smoking Tobacco Use: Never    Smokeless Tobacco Use: Never    Passive Exposure: Not on file  Financial Resource Strain: Not on file  Food Insecurity: Not on file  Transportation Needs: Not on file  Physical Activity: Not on file  Stress: Not on file  Social Connections: Not on file  Depression (PHQ2-9): Low Risk (05/12/2024)   Depression (PHQ2-9)    PHQ-2 Score: 0  Alcohol Screen: Not on file  Housing: Not on file  Utilities: Not on file  Health Literacy: Not on file    Her Allergies Are:  Allergies[1]:   Her Current Medications Are:  Outpatient Encounter Medications as of 10/12/2024  Medication Sig   BIOTIN PO Take 4 tablets by mouth daily.   Calcium  Carbonate (CALCIUM  600 PO) Take by mouth.   Cholecalciferol (VITAMIN D3 PO) Take 1 tablet by mouth daily.   citalopram  (CELEXA ) 20 MG tablet TAKE ONE TABLET (20MG  TOTAL) BY MOUTH DAILY   ibuprofen  (ADVIL ) 800 MG tablet Take 1 tablet (800 mg total) by mouth every 8 (eight) hours as needed for moderate pain. For AFTER surgery only (Patient taking differently: Take 800 mg by mouth as needed for moderate pain (pain score 4-6). For AFTER surgery only)   loratadine (CLARITIN) 10 MG tablet Take 10 mg by mouth daily.   MIEBO 1.338 GM/ML SOLN Apply 1 drop to eye 4 (four) times daily. (Patient taking differently: Apply 1 drop to eye as needed.)   pantoprazole  (PROTONIX ) 40 MG tablet Take 1 tablet (40 mg total) by mouth  daily. Patient needs follow up appointment for future refills. Please call [phone] to schedule an appointment.   rosuvastatin  (CRESTOR ) 5 MG tablet TAKE ONE (1) TABLET BY MOUTH EVERY DAY FOR CHOLESTEROL   cephALEXin  (KEFLEX ) 500 MG capsule Take 1 capsule (500 mg total) by mouth 3 (three) times daily.   mupirocin  ointment (BACTROBAN ) 2 %  apply thin amount twice a day to toe area for 3 days   [DISCONTINUED] pantoprazole  (PROTONIX ) 20 MG tablet Take 1 tablet (20 mg total) by mouth daily. (Patient taking differently: Take 40 mg by mouth daily.)   No facility-administered encounter medications on file as of 10/12/2024.  :   Review of Systems:  Out of a complete 14 point review of systems, all are reviewed and negative with the exception of these symptoms as listed below:  Review of Systems  Objective:  Neurological Exam  Physical Exam Physical Examination:   Vitals:   10/12/24 1116  BP: (!) 142/82  Pulse: 73    General Examination: The patient is a very pleasant 64 y.o. female in no acute distress. She appears well-developed and well-nourished and well groomed.   HEENT: Normocephalic, atraumatic, pupils are equal, round and reactive to light, extraocular tracking is good without limitation to gaze excursion or nystagmus noted. no photophobia.  no Corrective eye glasses in place. Hearing is grossly intact.  Face is symmetric with normal facial animation. Speech is clear without dysarthria. There is no hypophonia. There is no lip, neck/head, jaw or voice tremor. Neck is supple with full range of passive and active motion. There are no carotid bruits on auscultation.  Airway/Oropharynx exam reveals: mild mouth dryness, good dental hygiene and moderate airway crowding, due to small airway entry, tonsils and tip of uvula not fully visualized, Mallampati class IV, thicker tongue noted.  Neck circumference 16 inches.  Tongue protrudes centrally.  Mild overbite noted.  Chest: Clear to  auscultation without wheezing, rhonchi or crackles noted.  Heart: S1+S2+0, regular and normal without murmurs, rubs or gallops noted.   Abdomen: Soft, non-tender and non-distended.  Extremities: There is no pitting edema in the distal lower extremities bilaterally.   Skin: Warm and dry without trophic changes noted.   Musculoskeletal: exam reveals no obvious joint deformities.   Neurologically:  Mental status: The patient is awake, alert and oriented in all 4 spheres. Her immediate and remote memory, attention, language skills and fund of knowledge are appropriate. There is no evidence of aphasia, agnosia, apraxia or anomia. Speech is clear with normal prosody and enunciation. Thought process is linear. Mood is normal and affect is normal.  Cranial nerves II - XII are as described above under HEENT exam.  Motor exam: Normal bulk, moving all 4 extremities without restriction, no obvious action or resting tremor.  Fine motor skills and coordination: Intact grossly.  Cerebellar testing: No dysmetria or intention tremor. There is no truncal or gait ataxia.  Sensory exam: intact to light touch in the upper and lower extremities.  Gait, station and balance: She stands easily. No veering to one side is noted. No leaning to one side is noted. Posture is age-appropriate and stance is narrow based. Gait shows normal stride length and normal pace. No problems turning are noted.   Assessment and Plan:   In summary, AURI JAHNKE is a 64 year old female with an underlying medical history of reflux disease, hypertension, history of stroke, hyperlipidemia, ovarian cancer, history of preeclampsia, allergy, status post hysterectomy, status post neck surgery, status post left salpingo oophorectomy, and obesity,whose history and physical exam are concerning for sleep disordered breathing, particularly obstructive sleep apnea (OSA). A laboratory attended sleep study is typically considered gold standard for  evaluation of sleep disordered breathing.   I had a long chat with the patient and her husband about my findings and the diagnosis of sleep apnea, particularly OSA, its  prognosis and treatment options. We talked about medical/conservative treatments, surgical interventions and non-pharmacological approaches for symptom control. I explained, in particular, the risks and ramifications of untreated moderate to severe OSA, especially with respect to developing cardiovascular disease down the road, including congestive heart failure (CHF), difficult to treat hypertension, cardiac arrhythmias (particularly A-fib), neurovascular complications including TIA, stroke and dementia. Even type 2 diabetes has, in part, been linked to untreated OSA. Symptoms of untreated OSA may include (but may not be limited to) daytime sleepiness, nocturia (i.e. frequent nighttime urination), memory problems, mood irritability and suboptimally controlled or worsening mood disorder such as depression and/or anxiety, lack of energy, lack of motivation, physical discomfort, as well as recurrent headaches, especially morning or nocturnal headaches. We talked about the importance of maintaining a healthy lifestyle and striving for healthy weight.  In addition, we talked about the importance of striving for and maintaining good sleep hygiene.  She is encouraged to scale back on her caffeine consumption. I recommended a sleep study at this time. I outlined the differences between a laboratory attended sleep study which is considered more comprehensive and accurate over the option of a home sleep test (HST); the latter may lead to underestimation of sleep disordered breathing in some instances and does not help with diagnosing upper airway resistance syndrome and is not accurate enough to diagnose primary central sleep apnea typically. I outlined possible surgical and non-surgical treatment options of OSA, including the use of a positive airway  pressure (PAP) device (i.e. CPAP, AutoPAP/APAP or BiPAP in certain circumstances), a custom-made dental device (aka oral appliance, which would require a referral to a specialist dentist or orthodontist typically, and is generally speaking not considered for patients with full dentures or edentulous state), upper airway surgical options, such as traditional UPPP (which is not considered a first-line treatment) or the Inspire device (hypoglossal nerve stimulator, which would involve a referral for consultation with an ENT surgeon, after careful selection, following inclusion criteria - also not first-line treatment). I explained the PAP treatment option to the patient in detail, as this is generally considered first-line treatment.  The patient indicated that she would be willing to try PAP therapy, if the need arises. I explained the importance of being compliant with PAP treatment, not only for insurance purposes but primarily to improve patient's symptoms symptoms, and for the patient's long term health benefit, including to reduce Her cardiovascular risks longer-term.    We will pick up our discussion about the next steps and treatment options after testing.  We will keep her posted as to the test results by phone call and/or MyChart messaging where possible.  We will plan to follow-up in sleep clinic accordingly as well.  I answered all their questions today and the patient and her husband were in agreement.   I encouraged her to call with any interim questions, concerns, problems or updates or email us  through MyChart.  Generally speaking, sleep test authorizations may take up to 2 weeks, sometimes less, sometimes longer, the patient is encouraged to get in touch with us  if they do not hear back from the sleep lab staff directly within the next 2 weeks.  Thank you very much for allowing me to participate in the care of this nice patient. If I can be of any further assistance to you please do not hesitate  to call me at [phone].  Sincerely,   True Mar, MD, PhD      [1]  Allergies Allergen Reactions  Wound Dressing Adhesive Rash    Dermabond

## 2024-10-14 ENCOUNTER — Encounter: Payer: Self-pay | Admitting: Hematology

## 2024-10-14 ENCOUNTER — Ambulatory Visit: Admitting: Family Medicine

## 2024-10-14 VITALS — BP 138/79 | HR 76 | Temp 97.7°F | Ht 65.0 in | Wt 214.5 lb

## 2024-10-14 DIAGNOSIS — M545 Low back pain, unspecified: Secondary | ICD-10-CM

## 2024-10-14 DIAGNOSIS — K8020 Calculus of gallbladder without cholecystitis without obstruction: Secondary | ICD-10-CM | POA: Diagnosis not present

## 2024-10-14 MED ORDER — IBUPROFEN 600 MG PO TABS: ORAL_TABLET | ORAL | 0 refills | Status: DC

## 2024-10-14 NOTE — Telephone Encounter (Signed)
 PT has appointment this morning with Dr. Glendia at 10:30

## 2024-10-14 NOTE — Progress Notes (Signed)
 "  Subjective:    Patient ID: [name], female    DOB: 10-03-1961, 64 y.o.   MRN: [id number]  HPI Patient is in room 9.  Patient is here for experiencing lower left back pain.  Patient relates this been off and on for several months even goes back to last year Hurts when she rolls over in bed Hurts when she sits up No radiation down the legs She has tried ibuprofen  and Tylenol  for it Denies dysuria urinary frequency denies hematuria Patient can move certain directions and it will tighten up. Patient stated it can make her catch her breath at times  Patient also with frequent gallstone attacks it has been known that she has had gallstones for quite some time but now she is having 3 attacks per month typically last several hours at a time some nausea moderate pressure pain discomfort no vomiting no jaundice no fevers  Discussed the use of AI scribe software for clinical note transcription with the patient, who gave verbal consent to proceed.  History of Present Illness   [name] is a 64 year old female who presents with persistent back pain and gallbladder issues.  She experiences persistent back pain primarily located on the right side, ongoing since her diagnosis of kidney stones. The pain is described as a dull, deep ache that does not radiate down the leg and is aggravated by movements such as sitting up in bed, rolling over, or sitting for extended periods. The pain was exacerbated after climbing into the attic. She manages the pain with Tylenol , taking 500 mg twice daily, but not every day due to concerns about side effects. She has not used ibuprofen  recently as she did not have any at home. No fever, vomiting, burning with urination, or blood in urine. Reports dull, deep back pain without sharpness, and no pain severe enough to wake her from sleep.  She also experiences gallbladder issues, with pressure and pain that have increased in frequency and intensity over the  years. Episodes occur at least three times a month, lasting a few hours, and are severe enough to cause her to avoid eating. She has a history of gallstones diagnosed over twenty years ago and uses a heating pad for relief during these episodes.  Her past medical history includes a recent sleep study consultation and efforts to manage her weight, expressing a need to lose some.       Review of Systems     Objective:   Physical Exam  General-in no acute distress Eyes-no discharge Lungs-respiratory rate normal, CTA CV-no murmurs,RRR Extremities skin warm dry no edema Neuro grossly normal Behavior normal, alert Abdomen is soft with no guarding or rebound Subjective lumbar pain   Symptomatic cholelithiasis Intermittent pressure and nausea, increasing in frequency. Severe episodes warrant surgical consideration. She desires cholecystectomy but has delayed. - Ordered right upper quadrant ultrasound. - Referred to general surgeon for evaluation and potential cholecystectomy. - Discussed potential post-surgery side effects.  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 Previous imaging showed lumbar spine arthritis. - Recommended ibuprofen  600 mg as needed. - Advised gentle stretching exercises. - Ordered thoracic spine x-rays. - Coordinated with oncologist for imaging.   Obesity Discussed weight management challenges and potential future medications. Focus on diet and activity. - Encouraged healthy dietary choices and physical activity. - Discussed potential future weight loss medications.      Assessment & Plan:  1. Gall  stones (Primary) Consults surgery [facility] Ultrasound Not acute currently Causing frequent flareups per month  2. Lumbar pain Gentle range of motion exercises recommended Previous has had a kidney stone but this does not appear to be kidney stone She has a scan  coming up with her oncologist this would give me reasonable to look at her spine hold off on physical therapy for x-rays currently  Will send communication to her oncologist   "

## 2024-10-18 ENCOUNTER — Telehealth: Payer: Self-pay | Admitting: Neurology

## 2024-10-18 NOTE — Telephone Encounter (Signed)
 [employer] state no auth req.  Patient is scheduled at [facility] for 11/03/2024 at 9 pm  Mailed packet and sent mychart

## 2024-10-20 ENCOUNTER — Encounter: Payer: Self-pay | Admitting: Hematology

## 2024-10-20 ENCOUNTER — Other Ambulatory Visit: Payer: Self-pay

## 2024-10-20 DIAGNOSIS — K8020 Calculus of gallbladder without cholecystitis without obstruction: Secondary | ICD-10-CM

## 2024-10-21 ENCOUNTER — Ambulatory Visit (HOSPITAL_COMMUNITY)
Admission: RE | Admit: 2024-10-21 | Discharge: 2024-10-21 | Disposition: A | Discharge status: Home / Self Care | Source: Ambulatory Visit | Attending: Family Medicine | Admitting: Family Medicine

## 2024-10-21 DIAGNOSIS — K8020 Calculus of gallbladder without cholecystitis without obstruction: Secondary | ICD-10-CM | POA: Insufficient documentation

## 2024-10-22 ENCOUNTER — Other Ambulatory Visit: Payer: Self-pay

## 2024-10-22 ENCOUNTER — Ambulatory Visit: Payer: Self-pay | Admitting: Family Medicine

## 2024-10-22 ENCOUNTER — Other Ambulatory Visit: Payer: Self-pay | Admitting: Hematology

## 2024-10-22 ENCOUNTER — Encounter: Payer: Self-pay | Admitting: Family Medicine

## 2024-10-22 DIAGNOSIS — C562 Malignant neoplasm of left ovary: Secondary | ICD-10-CM

## 2024-10-26 ENCOUNTER — Other Ambulatory Visit: Payer: Self-pay | Admitting: Hematology

## 2024-10-26 DIAGNOSIS — Z1231 Encounter for screening mammogram for malignant neoplasm of breast: Secondary | ICD-10-CM

## 2024-11-03 ENCOUNTER — Ambulatory Visit: Admitting: Neurology

## 2024-11-03 DIAGNOSIS — R635 Abnormal weight gain: Secondary | ICD-10-CM

## 2024-11-03 DIAGNOSIS — R0683 Snoring: Secondary | ICD-10-CM

## 2024-11-03 DIAGNOSIS — Z9189 Other specified personal risk factors, not elsewhere classified: Secondary | ICD-10-CM

## 2024-11-03 DIAGNOSIS — R5383 Other fatigue: Secondary | ICD-10-CM

## 2024-11-03 DIAGNOSIS — R0681 Apnea, not elsewhere classified: Secondary | ICD-10-CM

## 2024-11-03 DIAGNOSIS — G4733 Obstructive sleep apnea (adult) (pediatric): Secondary | ICD-10-CM

## 2024-11-03 DIAGNOSIS — G4720 Circadian rhythm sleep disorder, unspecified type: Secondary | ICD-10-CM

## 2024-11-03 DIAGNOSIS — Z820 Family history of epilepsy and other diseases of the nervous system: Secondary | ICD-10-CM

## 2024-11-03 DIAGNOSIS — E669 Obesity, unspecified: Secondary | ICD-10-CM

## 2024-11-04 ENCOUNTER — Ambulatory Visit: Payer: Self-pay | Admitting: Surgery

## 2024-11-04 NOTE — H&P (Signed)
 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 Viktoria), laparoscopic total hysterectomy and right salpingo-oophorectomy in 2011, who presents for consultation regarding gallstones.  Had an ultrasound done on January 14 which demonstrates cholelithiasis and a 7 mm gallbladder polyp, no sonographic evidence of cholecystitis.  Common bile duct 3 mm.  This also describes fatty liver. She has known about the gallstones for about 20 years but is now having 3 gallbladder attacks per month, each lasting typically several hours at a time.  The pain is a pressure type pain sensation in the epigastrium/subxiphoid region.  Has had nausea in the past but not currently.  Episodes have become more frequent and more severe over time. She is going for her surveillance imaging next week and has follow-up with Dr. Lanny on February 5.   Review of Systems: A complete review of systems was obtained from the patient.  I have reviewed this information and discussed as appropriate with the patient.  See HPI as well for other ROS.   Medical History: History reviewed. No pertinent past medical history.  There is no problem list on file for this patient.   Past Surgical History:  Procedure Laterality Date   HYSTERECTOMY     left ovary removal     neck surgery       Allergies  Allergen Reactions   Wound Dressings Rash    Dermabond    Current Outpatient Medications on File Prior to Visit  Medication Sig Dispense Refill   BIOTIN ORAL Take by mouth     calcium  carbonate 600 mg calcium  (1,500 mg) Tab  tablet Take 600 mg by mouth once daily     citalopram  (CELEXA ) 20 MG tablet Take 20 mg by mouth once daily     ergocalciferol , vitamin D2, (VITAMIN D2 ORAL) Take 25 mcg by mouth once daily     loratadine (CLARITIN) 10 mg tablet Take 10 mg by mouth once daily     pantoprazole  (PROTONIX ) 40 MG DR tablet Take 40 mg by mouth once daily     perfluorohexyloctane, PF, (MIEBO, PF,) 100 % Drop Apply 1 drop to eye as needed     rosuvastatin  (CRESTOR ) 5 MG tablet Take 5 mg by mouth once daily     ascorbic acid (VITAMIN C ORAL) Take by mouth     No current facility-administered medications on file prior to visit.    Family History  Problem Relation Age of Onset   Skin cancer Mother    Coronary Artery Disease (Blocked arteries around heart) Mother    High blood pressure (Hypertension) Mother    Stroke Mother    Stroke Father    Breast cancer Sister      Social History   Tobacco Use  Smoking Status Never  Smokeless Tobacco Never     Social History   Socioeconomic History   Marital status: Married  Tobacco Use   Smoking status: Never   Smokeless tobacco: Never  Vaping Use   Vaping status: Never Used  Substance and Sexual Activity   Drug use: Never   Social Drivers of Health   Housing Stability: Unknown (11/04/2024)  Housing Stability Vital Sign    Homeless in the Last Year: No    Objective:    Vitals:   11/04/24 0948  PainSc: 0-No pain    There is no height or weight on file to calculate BMI.  Gen: A&Ox3, no distress  Chest: respiratory effort is normal. Abdomen: soft, nondistended, nontender.  Neuro: no gross deficit Psych: appropriate mood and affect, normal insight/judgment intact  Skin: warm and dry    Assessment and Plan:  Diagnoses and all orders for this visit:  Biliary colic    I recommend proceeding with laparoscopic cholecystectomy with possible cholangiogram.  Discussed the relevant anatomy using a diagram to demonstrate, and went over surgical  technique.  Discussed risks of surgery including bleeding, infection, pain, scarring, intraabdominal injury specifically to the common bile duct and sequelae, subtotal cholecystectomy, bile leak, conversion to open surgery, failure to resolve symptoms, post-cholecystectomy diarrhea which is typically self-limited, blood clots/ pulmonary embolus, heart attack, pneumonia, stroke, etc. Questions were welcomed and answered to patient's satisfaction.  Patient wishes to proceed with surgery.     Mitzie Freund MD FACS    "

## 2024-11-05 ENCOUNTER — Inpatient Hospital Stay: Attending: Hematology

## 2024-11-05 ENCOUNTER — Other Ambulatory Visit: Payer: Self-pay

## 2024-11-05 DIAGNOSIS — C562 Malignant neoplasm of left ovary: Secondary | ICD-10-CM

## 2024-11-05 DIAGNOSIS — R222 Localized swelling, mass and lump, trunk: Secondary | ICD-10-CM

## 2024-11-05 DIAGNOSIS — N6320 Unspecified lump in the left breast, unspecified quadrant: Secondary | ICD-10-CM

## 2024-11-05 LAB — CMP (CANCER CENTER ONLY)
ALT: 25 U/L (ref 0–44)
AST: 23 U/L (ref 15–41)
Albumin: 4.2 g/dL (ref 3.5–5.0)
Alkaline Phosphatase: 64 U/L (ref 38–126)
Anion gap: 11 (ref 5–15)
BUN: 10 mg/dL (ref 8–23)
CO2: 24 mmol/L (ref 22–32)
Calcium: 9.4 mg/dL (ref 8.9–10.3)
Chloride: 107 mmol/L (ref 98–111)
Creatinine: 0.73 mg/dL (ref 0.44–1.00)
GFR, Estimated: >60 mL/min
Glucose, Bld: 135 mg/dL — ABNORMAL HIGH (ref 70–99)
Potassium: 3.8 mmol/L (ref 3.5–5.1)
Sodium: 142 mmol/L (ref 135–145)
Total Bilirubin: 0.4 mg/dL (ref 0.0–1.2)
Total Protein: 7.2 g/dL (ref 6.5–8.1)

## 2024-11-05 LAB — CBC WITH DIFFERENTIAL (CANCER CENTER ONLY)
Abs Immature Granulocytes: 0.03 10*3/uL (ref 0.00–0.07)
Basophils Absolute: 0 10*3/uL (ref 0.0–0.1)
Basophils Relative: 1 %
Eosinophils Absolute: 0.3 10*3/uL (ref 0.0–0.5)
Eosinophils Relative: 5 %
HCT: 40.8 % (ref 36.0–46.0)
Hemoglobin: 13.8 g/dL (ref 12.0–15.0)
Immature Granulocytes: 1 %
Lymphocytes Relative: 44 %
Lymphs Abs: 2.4 10*3/uL (ref 0.7–4.0)
MCH: 28.9 pg (ref 26.0–34.0)
MCHC: 33.8 g/dL (ref 30.0–36.0)
MCV: 85.4 fL (ref 80.0–100.0)
Monocytes Absolute: 0.3 10*3/uL (ref 0.1–1.0)
Monocytes Relative: 6 %
Neutro Abs: 2.3 10*3/uL (ref 1.7–7.7)
Neutrophils Relative %: 43 %
Platelet Count: 282 10*3/uL (ref 150–400)
RBC: 4.78 MIL/uL (ref 3.87–5.11)
RDW: 13.2 % (ref 11.5–15.5)
WBC Count: 5.4 10*3/uL (ref 4.0–10.5)
nRBC: 0 % (ref 0.0–0.2)

## 2024-11-08 ENCOUNTER — Inpatient Hospital Stay

## 2024-11-08 ENCOUNTER — Ambulatory Visit (HOSPITAL_COMMUNITY)
Admission: RE | Admit: 2024-11-08 | Discharge: 2024-11-08 | Disposition: A | Source: Ambulatory Visit | Attending: Hematology

## 2024-11-08 DIAGNOSIS — C562 Malignant neoplasm of left ovary: Secondary | ICD-10-CM

## 2024-11-08 MED ORDER — IOHEXOL 9 MG/ML PO SOLN
1000.0000 mL | ORAL | Status: AC
  Administered 2024-11-08: 1000 mL via ORAL

## 2024-11-08 MED ORDER — IOHEXOL 300 MG/ML  SOLN
100.0000 mL | Freq: Once | INTRAMUSCULAR | Status: AC | PRN
  Administered 2024-11-08: 100 mL via INTRAVENOUS

## 2024-11-09 ENCOUNTER — Encounter: Payer: Self-pay | Admitting: Hematology

## 2024-11-09 ENCOUNTER — Ambulatory Visit: Payer: Self-pay | Admitting: Neurology

## 2024-11-09 ENCOUNTER — Telehealth: Payer: Self-pay

## 2024-11-09 ENCOUNTER — Inpatient Hospital Stay: Admitting: Hematology

## 2024-11-09 DIAGNOSIS — C562 Malignant neoplasm of left ovary: Secondary | ICD-10-CM | POA: Diagnosis not present

## 2024-11-09 DIAGNOSIS — K769 Liver disease, unspecified: Secondary | ICD-10-CM

## 2024-11-09 DIAGNOSIS — G4733 Obstructive sleep apnea (adult) (pediatric): Secondary | ICD-10-CM

## 2024-11-09 NOTE — Procedures (Signed)
 Physician Interpretation: Please see link under Procedure Tab or under Encounters tab for physician report, technical report, as well as O2 titration and/or PAP titration tables (if applicable).

## 2024-11-09 NOTE — Telephone Encounter (Signed)
 Critical CT Report called by [facility].  Notified Dr Lanny and her Team  IMPRESSION: 1. New enhancing focus within segment 2 of the liver measuring 9 mm, indeterminate. Recommend further evaluation with contrast-enhanced MRI of the abdomen. 2. No evidence of metastatic disease in the chest. 3. Nonobstructing 5 mm interpolar right renal stone. 4. Cholelithiasis. 5. Unchanged L4 peripherally sclerotic focus, previously biopsied.   These results will be called to the ordering clinician or representative by the Radiologist Assistant, and communication documented in the PACS or [employer].     Electronically Signed   By: Limin  Xu M.D.   On: 11/08/2024 17:19

## 2024-11-09 NOTE — Assessment & Plan Note (Signed)
 Stage IIB -presented with acute left abdominal pain. CT AP on 12/13/21 in ED showed a 6.6 cm hemorrhagic mass. Baseline CA 125 from 01/03/22 was WNL at 9.7. her pain resolved and no recurrent or other symptoms -S/p left salpingo-oophorectomy on 03/12/22 with Dr. Viktoria. 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 I spoke with pathologist Dr. Rebbecca, he agrees with the diagnosis but pointed out that 2% ovarian cancer is intestinal type and IHC will be similar to low GI primary.  -colonoscopy on 03/28/22 with Dr. San showed no suspicious mass, only polyps.  -baseline CEA on 04/01/22 was WNL.  -PET scan on 04/10/22 showed: no convincing evidence of hypermetabolic disease; postsurgical hypermetabolism in left adnexa and abdominal wall. -enterography on 04/15/22 showed: no small bowel masses or nodularity or tumor along omentum or mesentery; no definite appendiceal tumor. -she completed 4 cycles adjuvant CAPEOX 04/30/22 - 07/02/22 and one additional cycle of Xeloda  alone.   -Surveillance CT scan from 05/05/2024 showed no evidence of recurrence except a new 6 mm sclerotic rimmed focus over the L4 vertebral body just right of midline which is indeterminate.  - Lumbar MRI with and without contrast showed degenerative changes, and a suspicious lesion in the posterior L4 vertebral body, concerning for metastatic disease.

## 2024-11-09 NOTE — Telephone Encounter (Signed)
 Pt and spouse called back stating that the pt recalls she was told she had a spot on her liver prior to her being diagnosed with cancer several years ago.  Pt's spouse said they were told the spot would be monitored but it was not and now the spot has grown in size plus suspicious.  Pt and spouse just wanted to make Dr Lanny aware of the spot existing prior to the pt being dx with cancer.  Stated this nurse will make Dr Lanny aware of their call.

## 2024-11-10 ENCOUNTER — Encounter: Payer: Self-pay | Admitting: Hematology

## 2024-11-11 ENCOUNTER — Inpatient Hospital Stay: Admitting: Hematology

## 2024-11-15 ENCOUNTER — Encounter (HOSPITAL_COMMUNITY): Admission: RE | Admit: 2024-11-15

## 2024-11-15 ENCOUNTER — Ambulatory Visit (HOSPITAL_COMMUNITY)

## 2024-11-19 ENCOUNTER — Ambulatory Visit (HOSPITAL_COMMUNITY)

## 2024-11-22 ENCOUNTER — Inpatient Hospital Stay: Admitting: Hematology

## 2024-11-23 ENCOUNTER — Ambulatory Visit (HOSPITAL_COMMUNITY): Admit: 2024-11-23 | Admitting: Surgery

## 2024-12-02 ENCOUNTER — Ambulatory Visit: Admitting: Hematology

## 2024-12-02 ENCOUNTER — Other Ambulatory Visit

## 2024-12-03 ENCOUNTER — Ambulatory Visit
